# Patient Record
Sex: Male | Born: 1960
Health system: Southern US, Community
[De-identification: ages and names within clinical notes are randomized; demographics above are authoritative.]

## PROBLEM LIST (undated history)

## (undated) ENCOUNTER — Emergency Department (HOSPITAL_COMMUNITY): Admission: EM | Payer: Self-pay | Source: Home / Self Care

## (undated) DIAGNOSIS — I10 Essential (primary) hypertension: Secondary | ICD-10-CM

## (undated) DIAGNOSIS — M1990 Unspecified osteoarthritis, unspecified site: Secondary | ICD-10-CM

## (undated) DIAGNOSIS — E7800 Pure hypercholesterolemia, unspecified: Secondary | ICD-10-CM

## (undated) DIAGNOSIS — C61 Malignant neoplasm of prostate: Secondary | ICD-10-CM

## (undated) DIAGNOSIS — M109 Gout, unspecified: Secondary | ICD-10-CM

## (undated) HISTORY — PX: LUMBAR DISC SURGERY: SHX700

## (undated) HISTORY — PX: KNEE ARTHROSCOPY: SHX127

## (undated) HISTORY — DX: Unspecified osteoarthritis, unspecified site: M19.90

---

## 1997-06-02 ENCOUNTER — Emergency Department (HOSPITAL_COMMUNITY): Admission: EM | Admit: 1997-06-02 | Discharge: 1997-06-02 | Payer: Self-pay | Admitting: *Deleted

## 2001-04-26 ENCOUNTER — Emergency Department (HOSPITAL_COMMUNITY): Admission: EM | Admit: 2001-04-26 | Discharge: 2001-04-26 | Payer: Self-pay

## 2003-08-12 ENCOUNTER — Encounter: Admission: RE | Admit: 2003-08-12 | Discharge: 2003-08-12 | Payer: Self-pay | Admitting: Family Medicine

## 2004-07-20 ENCOUNTER — Emergency Department (HOSPITAL_COMMUNITY): Admission: EM | Admit: 2004-07-20 | Discharge: 2004-07-20 | Payer: Self-pay | Admitting: Emergency Medicine

## 2006-10-11 ENCOUNTER — Ambulatory Visit (HOSPITAL_COMMUNITY): Admission: RE | Admit: 2006-10-11 | Discharge: 2006-10-11 | Payer: Self-pay | Admitting: Neurosurgery

## 2008-07-28 ENCOUNTER — Emergency Department (HOSPITAL_COMMUNITY): Admission: EM | Admit: 2008-07-28 | Discharge: 2008-07-28 | Payer: Self-pay | Admitting: Emergency Medicine

## 2008-07-28 ENCOUNTER — Ambulatory Visit: Payer: Self-pay | Admitting: Surgery

## 2008-07-28 ENCOUNTER — Encounter (INDEPENDENT_AMBULATORY_CARE_PROVIDER_SITE_OTHER): Payer: Self-pay | Admitting: Emergency Medicine

## 2010-04-30 LAB — DIFFERENTIAL
Basophils Absolute: 0.1 10*3/uL (ref 0.0–0.1)
Basophils Relative: 1 % (ref 0–1)
Eosinophils Absolute: 0.2 10*3/uL (ref 0.0–0.7)
Eosinophils Relative: 2 % (ref 0–5)
Lymphocytes Relative: 40 % (ref 12–46)
Lymphs Abs: 3.3 10*3/uL (ref 0.7–4.0)
Monocytes Absolute: 0.6 10*3/uL (ref 0.1–1.0)
Monocytes Relative: 8 % (ref 3–12)
Neutro Abs: 4.2 10*3/uL (ref 1.7–7.7)
Neutrophils Relative %: 50 % (ref 43–77)

## 2010-04-30 LAB — BASIC METABOLIC PANEL
BUN: 12 mg/dL (ref 6–23)
CO2: 27 mEq/L (ref 19–32)
Calcium: 9.5 mg/dL (ref 8.4–10.5)
Chloride: 107 mEq/L (ref 96–112)
Creatinine, Ser: 1.08 mg/dL (ref 0.4–1.5)
GFR calc Af Amer: >60 mL/min (ref >60)
GFR calc non Af Amer: >60 mL/min (ref >60)
Glucose, Bld: 107 mg/dL — ABNORMAL HIGH (ref 70–99)
Potassium: 3.9 mEq/L (ref 3.5–5.1)
Sodium: 141 mEq/L (ref 135–145)

## 2010-04-30 LAB — BODY FLUID CULTURE: Culture: NO GROWTH

## 2010-04-30 LAB — CBC
HCT: 44.6 % (ref 39.0–52.0)
Hemoglobin: 15.4 g/dL (ref 13.0–17.0)
MCHC: 34.5 g/dL (ref 30.0–36.0)
MCV: 94.3 fL (ref 78.0–100.0)
Platelets: 210 10*3/uL (ref 150–400)
RBC: 4.73 MIL/uL (ref 4.22–5.81)
RDW: 13.9 % (ref 11.5–15.5)
WBC: 8.4 10*3/uL (ref 4.0–10.5)

## 2010-04-30 LAB — SYNOVIAL CELL COUNT + DIFF, W/ CRYSTALS
Eosinophils-Synovial: 0 % (ref 0–1)
Lymphocytes-Synovial Fld: 1 % (ref 0–20)
Neutrophil, Synovial: 78 % — ABNORMAL HIGH (ref 0–25)
Other Cells-SYN: 0

## 2010-06-06 NOTE — Op Note (Signed)
NAMEMACAI, SISNEROS               ACCOUNT NO.:  [id number]   MEDICAL RECORD NO.:  [id number]          PATIENT TYPE:  OIB   LOCATION:  3172                         FACILITY:  [facility]   PHYSICIAN:  Reinaldo Meeker, M.D. DATE OF BIRTH:  1960/10/13   DATE OF PROCEDURE:  10/11/2006  DATE OF DISCHARGE:                               OPERATIVE REPORT   PREOPERATIVE DIAGNOSIS:  Herniated disk L4-5 central.   POSTOPERATIVE DIAGNOSIS:  Herniated disk L4-5 central.   PROCEDURE:  1. Left L4-5 interlaminar laminotomy with excision of herniated disk      with operating microscope.  2. Microsection L4-5 disk and L5 nerve root.   SURGEON:  Reinaldo Meeker, M.D.   ASSISTANT:  Donalee Citrin, M.D.   PROCEDURE IN DETAIL:  After being placed in the prone position, the  patient's back was prepped and draped in the usual sterile fashion.  Localizing x-rays were taken prior to incision to identify the  appropriate level.  Midline incision was made above the spinous process  of L4 and L5.  Using Bovie cutting current the incision was carried down  to the spinous processes.  Subperiosteal dissection was then carried out  on the left-sided spinous processes and lamina.  Self-retraining  retractor was placed for exposure.  X-rays showed approach at the  appropriate level.  Using a high-speed drill the inferior 1/2 of the L4  lamina, and the medial third of the facet joint, and the superior third  of the L5 lamina were removed.  Residual bone and ligamentum flavum  removed in piecemeal fashion.   The microscope was draped brought out into the field and used for the  remainder of the case.  Using microsection technique the lateral aspect  of the thecal sac and the L5 nerve root were identified.  Further  coagulation was carried down to the floor of the canal to identify the  L4-5 disk which was found be diffusely herniated.  After coagulating the  annulus, the annulus was incised with a 15-blade.  Using  pituitary  rongeurs and curettes, this clean-out was carried out with particular  attention to get up behind the L4 body to clean out the disk material  from that region.  At this time, inspection was carried out in all  directions for any evidence of residual compression, and none could be  identified.  Large amounts of irrigation were carried out.  Any bleeding  was controlled by electrocoagulation and Gelfoam.  The wound was then  closed in multiple layers of Vicryl, in the muscle, fascia, subcutaneous  and subcuticular tissues, and staples were placed on the skin.  A  sterile dressing was then applied; and the patient was extubated, and  taken to the recovery room in stable condition.           ______________________________  Reinaldo Meeker, M.D.     ROK/MEDQ  D:  10/11/2006  T:  10/11/2006  Job:  [id number]

## 2010-06-17 ENCOUNTER — Inpatient Hospital Stay (INDEPENDENT_AMBULATORY_CARE_PROVIDER_SITE_OTHER)
Admission: RE | Admit: 2010-06-17 | Discharge: 2010-06-17 | Disposition: A | Discharge status: Home / Self Care | Payer: Self-pay | Source: Ambulatory Visit | Attending: Family Medicine | Admitting: Family Medicine

## 2010-06-17 DIAGNOSIS — K649 Unspecified hemorrhoids: Secondary | ICD-10-CM

## 2010-11-03 LAB — BASIC METABOLIC PANEL
CO2: 26
Calcium: 9.9
Creatinine, Ser: 1.15
Glucose, Bld: 111 — ABNORMAL HIGH

## 2010-11-03 LAB — CBC
Hemoglobin: 15.3
MCHC: 34.1
RDW: 14

## 2012-07-30 ENCOUNTER — Emergency Department (HOSPITAL_COMMUNITY)
Admission: EM | Admit: 2012-07-30 | Discharge: 2012-07-30 | Disposition: A | Discharge status: Home / Self Care | Payer: Self-pay | Attending: Emergency Medicine | Admitting: Emergency Medicine

## 2012-07-30 ENCOUNTER — Encounter (HOSPITAL_COMMUNITY): Payer: Self-pay

## 2012-07-30 DIAGNOSIS — I10 Essential (primary) hypertension: Secondary | ICD-10-CM | POA: Insufficient documentation

## 2012-07-30 DIAGNOSIS — R52 Pain, unspecified: Secondary | ICD-10-CM | POA: Insufficient documentation

## 2012-07-30 DIAGNOSIS — M549 Dorsalgia, unspecified: Secondary | ICD-10-CM

## 2012-07-30 DIAGNOSIS — Z8739 Personal history of other diseases of the musculoskeletal system and connective tissue: Secondary | ICD-10-CM | POA: Insufficient documentation

## 2012-07-30 DIAGNOSIS — F17200 Nicotine dependence, unspecified, uncomplicated: Secondary | ICD-10-CM | POA: Insufficient documentation

## 2012-07-30 DIAGNOSIS — M545 Low back pain, unspecified: Secondary | ICD-10-CM | POA: Insufficient documentation

## 2012-07-30 DIAGNOSIS — G8929 Other chronic pain: Secondary | ICD-10-CM | POA: Insufficient documentation

## 2012-07-30 DIAGNOSIS — Z862 Personal history of diseases of the blood and blood-forming organs and certain disorders involving the immune mechanism: Secondary | ICD-10-CM | POA: Insufficient documentation

## 2012-07-30 DIAGNOSIS — Z9889 Other specified postprocedural states: Secondary | ICD-10-CM | POA: Insufficient documentation

## 2012-07-30 DIAGNOSIS — Z8639 Personal history of other endocrine, nutritional and metabolic disease: Secondary | ICD-10-CM | POA: Insufficient documentation

## 2012-07-30 HISTORY — DX: Essential (primary) hypertension: I10

## 2012-07-30 HISTORY — DX: Pure hypercholesterolemia, unspecified: E78.00

## 2012-07-30 MED ORDER — TRAMADOL HCL 50 MG PO TABS: 50.0000 mg | ORAL_TABLET | Freq: Four times a day (QID) | ORAL | Status: DC | PRN

## 2012-07-30 MED ORDER — OXYCODONE-ACETAMINOPHEN 5-325 MG PO TABS: 1.0000 | ORAL_TABLET | ORAL | Status: DC | PRN

## 2012-07-30 NOTE — ED Notes (Signed)
Pt c/o back pain. Had surgery per Dr. Gerlene Fee in 2008. Has had chronic pain since then. Was with a pain clinic but "won his settlement" and no longer has insurance. Went back to work 2 days ago and started having low back pain. Denies numbness to leg/foot.

## 2012-07-30 NOTE — ED Notes (Addendum)
Pt c/o chronic lower back pain since 2008, pt had surgery for a ruptured disc in 2008 returned to work on July 07, 2012. Pt presents today with increase pain to lower back

## 2012-07-30 NOTE — ED Provider Notes (Signed)
History  This chart was scribed for non-physician practitioner working with Joya Gaskins, MD by Greggory Stallion, ED scribe. This patient was seen in room TR08C/TR08C and the patient's care was started at 6:12 PM.  CSN: [id number] Arrival date & time 07/30/12  1647   Chief Complaint  Patient presents with  . Back Pain   The history is provided by the patient. No language interpreter was used.    HPI Comments: Wayne [name] is a 52 y.o. male with h/o chronic back pain who presents to the Emergency Department complaining of sudden onset, constant, sharp, lower back pain.  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 Pain worse with heavy lifting, pushing, and pulling motions.  Pt states he has taken Aleve for the pain with no relief.  No numbness or paresthesias of LE.  No loss of bowel or bladder function.  No urinary sx.  Past Medical History  Diagnosis Date  . Hypertension   . High cholesterol    Past Surgical History  Procedure Laterality Date  . Lumbar disc surgery    . Knee arthroscopy     History reviewed. No pertinent family history. History  Substance Use Topics  . Smoking status: Current Every Day Smoker  . Smokeless tobacco: Not on file  . Alcohol Use: Yes    Review of Systems  Musculoskeletal: Positive for back pain.  All other systems reviewed and are negative.    Allergies  Review of patient's allergies indicates no known allergies.  Home Medications  No current outpatient prescriptions on file.  BP 187/105  Pulse 62  Temp(Src) 98 F (36.7 C) (Oral)  Resp 18  SpO2 99%  Physical Exam  Nursing note and vitals reviewed. Constitutional: He is oriented to person, place, and time. He appears well-developed and well-nourished.  HENT:  Head: Normocephalic and atraumatic.  Mouth/Throat: Oropharynx is  clear and moist.  Eyes: Conjunctivae and EOM are normal.  Neck: Normal range of motion. Neck supple.  Cardiovascular: Normal rate, regular rhythm and normal heart sounds.   Pulmonary/Chest: Effort normal and breath sounds normal.  Musculoskeletal: Normal range of motion.       Lumbar back: He exhibits tenderness and pain. He exhibits normal range of motion, no bony tenderness, no swelling, no edema, no deformity, no laceration, no spasm and normal pulse.       Back:  LS- midline incision from prior surgery, TTP of paraspinal muscles bilaterally, negative straight leg raise, distal sensation intact, normal gait  Neurological: He is alert and oriented to person, place, and time.  Skin: Skin is warm and dry.  Psychiatric: He has a normal mood and affect.    ED Course  Procedures (including critical care time)  DIAGNOSTIC STUDIES: Oxygen Saturation is 99% on RA, normal by my interpretation.    COORDINATION OF CARE: 7:48 PM-Discussed treatment plan which includes pain medications with pt at bedside and pt agreed to plan.   Labs Reviewed - No data to display No results found. 1. Back pain     MDM   Back pain acute on chronic, likely from pts recent return to work after several years off. No concern for cauda equina. Rx tramadol and percocet.  Discussed plan with pt, he agreed.  Return precautions advised.  I personally performed the services described in this documentation, which  was scribed in my presence. The recorded information has been reviewed and is accurate.   Garlon Hatchet, PA-C 07/31/12 [phone]

## 2012-08-02 NOTE — ED Provider Notes (Signed)
Medical screening examination/treatment/procedure(s) were performed by non-physician practitioner and as supervising physician I was immediately available for consultation/collaboration.   Joya Gaskins, MD 08/02/12 [phone]

## 2013-05-29 ENCOUNTER — Telehealth (HOSPITAL_BASED_OUTPATIENT_CLINIC_OR_DEPARTMENT_OTHER): Payer: Self-pay | Admitting: *Deleted

## 2013-05-29 ENCOUNTER — Emergency Department (HOSPITAL_COMMUNITY)
Admission: EM | Admit: 2013-05-29 | Discharge: 2013-05-29 | Disposition: A | Discharge status: Home / Self Care | Payer: No Typology Code available for payment source | Attending: Emergency Medicine | Admitting: Emergency Medicine

## 2013-05-29 ENCOUNTER — Encounter (HOSPITAL_COMMUNITY): Payer: Self-pay | Admitting: Emergency Medicine

## 2013-05-29 DIAGNOSIS — S43499A Other sprain of unspecified shoulder joint, initial encounter: Secondary | ICD-10-CM | POA: Insufficient documentation

## 2013-05-29 DIAGNOSIS — S46812A Strain of other muscles, fascia and tendons at shoulder and upper arm level, left arm, initial encounter: Secondary | ICD-10-CM

## 2013-05-29 DIAGNOSIS — Z791 Long term (current) use of non-steroidal anti-inflammatories (NSAID): Secondary | ICD-10-CM | POA: Insufficient documentation

## 2013-05-29 DIAGNOSIS — I10 Essential (primary) hypertension: Secondary | ICD-10-CM | POA: Insufficient documentation

## 2013-05-29 DIAGNOSIS — S46819A Strain of other muscles, fascia and tendons at shoulder and upper arm level, unspecified arm, initial encounter: Principal | ICD-10-CM

## 2013-05-29 DIAGNOSIS — Z8639 Personal history of other endocrine, nutritional and metabolic disease: Secondary | ICD-10-CM | POA: Insufficient documentation

## 2013-05-29 DIAGNOSIS — X500XXA Overexertion from strenuous movement or load, initial encounter: Secondary | ICD-10-CM | POA: Insufficient documentation

## 2013-05-29 DIAGNOSIS — Z862 Personal history of diseases of the blood and blood-forming organs and certain disorders involving the immune mechanism: Secondary | ICD-10-CM | POA: Insufficient documentation

## 2013-05-29 DIAGNOSIS — Y9389 Activity, other specified: Secondary | ICD-10-CM | POA: Insufficient documentation

## 2013-05-29 DIAGNOSIS — Y929 Unspecified place or not applicable: Secondary | ICD-10-CM | POA: Insufficient documentation

## 2013-05-29 DIAGNOSIS — F17200 Nicotine dependence, unspecified, uncomplicated: Secondary | ICD-10-CM | POA: Insufficient documentation

## 2013-05-29 MED ORDER — HYDROCODONE-ACETAMINOPHEN 5-325 MG PO TABS: 1.0000 | ORAL_TABLET | ORAL | Status: DC | PRN

## 2013-05-29 MED ORDER — METHOCARBAMOL 500 MG PO TABS: 500.0000 mg | ORAL_TABLET | Freq: Two times a day (BID) | ORAL | Status: DC

## 2013-05-29 NOTE — ED Provider Notes (Signed)
CSN: [id number]     Arrival date & time 05/29/13  2118 History  This chart was scribed for non-physician practitioner, Abigail Butts, PA-C, working with Orlie Dakin, MD by Ladene Artist, ED Scribe. This patient was seen in room TR06C/TR06C and the patient's care was started at 10:37 PM.    Chief Complaint  Patient presents with  . Shoulder Pain   The history is provided by the patient and medical records. No language interpreter was used.   HPI Comments: Wayne [name] is a 53 y.o. male who presents to the Emergency Department complaining of sharp L shoulder pain onset 3 days ago. He reports the pain is located in the anterior and posterior shoulder. Pt reports a fall this winter after slipping on ice in the driveway and landing on his left shoulder. He reports he had pain for 3 days which resolved without lingering symptoms.  Pt states that the power steering then went out in his truck last week and has noted gradually worsening pain to the left shoulder since that time.  Pain is worse with movement. He denies neck pain, SOB, nausea, sweats, pain or shortness of breath with walking. Pt has tried Tramadol, Meloxicam, extra strength tylenol and applied Bengay to the area with mild relief. Patient denies personal or family cardiac history.  Past Medical History  Diagnosis Date  . Hypertension   . High cholesterol    Past Surgical History  Procedure Laterality Date  . Lumbar disc surgery    . Knee arthroscopy     History reviewed. No pertinent family history. History  Substance Use Topics  . Smoking status: Current Every Day Smoker -- 1.00 packs/day    Types: Cigarettes  . Smokeless tobacco: Not on file  . Alcohol Use: 8.4 oz/week    14 Cans of beer per week    Review of Systems  Constitutional: Negative for fever, chills and diaphoresis.  Respiratory: Negative for shortness of breath.   Cardiovascular: Negative for chest pain.  Gastrointestinal: Negative for nausea and  vomiting.  Musculoskeletal: Positive for arthralgias ( l shoudler). Negative for back pain, joint swelling, neck pain and neck stiffness.  Skin: Negative for wound.  Neurological: Negative for numbness.  Hematological: Does not bruise/bleed easily.  Psychiatric/Behavioral: The patient is not nervous/anxious.   All other systems reviewed and are negative.   Allergies  Review of patient's allergies indicates no known allergies.  Home Medications   Prior to Admission medications   Medication Sig Start Date End Date Taking? Authorizing Provider  meloxicam (MOBIC) 15 MG tablet Take 15 mg by mouth daily.   Yes Historical Provider, MD  traMADol (ULTRAM) 50 MG tablet Take 1 tablet (50 mg total) by mouth every 6 (six) hours as needed for pain. 07/30/12  Yes Larene Pickett, PA-C   BP 185/91  Pulse 84  Temp(Src) 98.3 F (36.8 C) (Oral)  Resp 18  SpO2 97% Physical Exam  Nursing note and vitals reviewed. Constitutional: He is oriented to person, place, and time. He appears well-developed and well-nourished. No distress.  HENT:  Head: Normocephalic and atraumatic.  Eyes: Conjunctivae are normal.  Neck: Normal range of motion.  Cardiovascular: Normal rate, regular rhythm, normal heart sounds and intact distal pulses.   No murmur heard. Capillary refill less than 3 seconds in the bilateral upper extremities Intact distal pulses Regular rate and rhythm  Pulmonary/Chest: Effort normal and breath sounds normal. No respiratory distress.  Clear and equal breath sounds  Musculoskeletal: Normal range of  motion. He exhibits tenderness. He exhibits no edema.       Left shoulder: He exhibits pain and spasm (left trapezius). He exhibits normal range of motion, no tenderness, no bony tenderness, no swelling, no effusion, no crepitus, no deformity, no laceration, normal pulse and normal strength.  Full ROM of L shoulder with pain; no joint line tenderness Pain to palp of the L trapezia  No pain to  palpation of L chest wall Negative empty can test  Lymphadenopathy:    He has no axillary adenopathy.  Neurological: He is alert and oriented to person, place, and time. Coordination normal.  Sensation intact to dull and sharp in the bilateral upper extremities Strength 5 out of 5 in the bilateral upper extremities to resisted flexion, extension, abduction and adduction including strong and equal grip strength  Skin: Skin is warm and dry. He is not diaphoretic. No erythema.  No tenting of the skin  Psychiatric: He has a normal mood and affect.    ED Course  Procedures (including critical care time) DIAGNOSTIC STUDIES: Oxygen Saturation is 97% on RA, normal by my interpretation.    COORDINATION OF CARE: 10:44 PM Discussed treatment plan with pt at bedside and pt agreed to plan.  Labs Review Labs Reviewed - No data to display  Imaging Review No results found.   EKG Interpretation None      MDM   Final diagnoses:  Strain of left trapezius muscle   [name] presents with nontraumatic left shoulder pain.  Pt without chest pain or history of physical consistent with cardiac etiology.  Patient with palpable spasm of the left trapezius.    Patient without trauma, no x-ray indicated at this time. Pt advised to follow up with primary care if symptoms persist for further evaluation and treatment.Conservative therapy recommended and discussed. No swelling given as I would like patient to continue range of motion of the shoulder to prevent frozen shoulder. Patient is return to emergency department for fevers, loss of movement in the shoulder or weakness in the arms. Patient will be dc home & is agreeable with above plan.  It has been determined that no acute conditions requiring further emergency intervention are present at this time. The patient/guardian have been advised of the diagnosis and plan. We have discussed signs and symptoms that warrant return to the ED, such as changes  or worsening in symptoms.   Vital signs are stable at discharge.   BP 185/91  Pulse 84  Temp(Src) 98.3 F (36.8 C) (Oral)  Resp 18  SpO2 97%  Patient/guardian has voiced understanding and agreed to follow-up with the PCP or specialist.  I personally performed the services described in this documentation, which was scribed in my presence. The recorded information has been reviewed and is accurate.    Jarrett Soho Rodgers Likes, PA-C 05/29/13 [id number]

## 2013-05-29 NOTE — Telephone Encounter (Signed)
Pt called requesting a work note.

## 2013-05-29 NOTE — Discharge Instructions (Signed)
1. Medications: Vicodin, Robaxin, usual home medications including your meloxicam 2. Treatment: rest, drink plenty of fluids, and gentle stretching 3. Follow Up: Please followup with your primary doctor for discussion of your diagnoses and further evaluation after today's visit; if you do not have a primary care doctor use the resource guide provided to find one;   Shoulder Pain The shoulder is the joint that connects your arms to your body. The bones that form the shoulder joint include the upper arm bone (humerus), the shoulder blade (scapula), and the collarbone (clavicle). The top of the humerus is shaped like a ball and fits into a rather flat socket on the scapula (glenoid cavity). A combination of muscles and strong, fibrous tissues that connect muscles to bones (tendons) support your shoulder joint and hold the ball in the socket. Small, fluid-filled sacs (bursae) are located in different areas of the joint. They act as cushions between the bones and the overlying soft tissues and help reduce friction between the gliding tendons and the bone as you move your arm. Your shoulder joint allows a wide range of motion in your arm. This range of motion allows you to do things like scratch your back or throw a ball. However, this range of motion also makes your shoulder more prone to pain from overuse and injury. Causes of shoulder pain can originate from both injury and overuse and usually can be grouped in the following four categories:  Redness, swelling, and pain (inflammation) of the tendon (tendinitis) or the bursae (bursitis).  Instability, such as a dislocation of the joint.  Inflammation of the joint (arthritis).  Broken bone (fracture). HOME CARE INSTRUCTIONS   Apply ice to the sore area.  Put ice in a plastic bag.  Place a towel between your skin and the bag.  Leave the ice on for 15-20 minutes, 03-04 times per day for the first 2 days.  Stop using cold packs if they do not help  with the pain.  If you have a shoulder sling or immobilizer, wear it as long as your caregiver instructs. Only remove it to shower or bathe. Move your arm as little as possible, but keep your hand moving to prevent swelling.  Squeeze a soft ball or foam pad as much as possible to help prevent swelling.  Only take over-the-counter or prescription medicines for pain, discomfort, or fever as directed by your caregiver. SEEK MEDICAL CARE IF:   Your shoulder pain increases, or new pain develops in your arm, hand, or fingers.  Your hand or fingers become cold and numb.  Your pain is not relieved with medicines. SEEK IMMEDIATE MEDICAL CARE IF:   Your arm, hand, or fingers are numb or tingling.  Your arm, hand, or fingers are significantly swollen or turn white or blue. MAKE SURE YOU:   Understand these instructions.  Will watch your condition.  Will get help right away if you are not doing well or get worse. Document Released: 10/18/2004 Document Revised: 10/03/2011 Document Reviewed: 12/23/2010 [facility] Patient Information 2014 [place], [place].   Emergency Department Resource Guide 1) Find a Doctor and Pay Out of Pocket Although you won't have to find out who is covered by your insurance plan, it is a good idea to ask around and get recommendations. You will then need to call the office and see if the doctor you have chosen will accept you as a new patient and what types of options they offer for patients who are self-pay. Some doctors offer  discounts or will set up payment plans for their patients who do not have insurance, but you will need to ask so you aren't surprised when you get to your appointment.  2) Contact Your Local Health Department Not all health departments have doctors that can see patients for sick visits, but many do, so it is worth a call to see if yours does. If you don't know where your local health department is, you can check in your phone book. The CDC also has a  tool to help you locate your state's health department, and many state websites also have listings of all of their local health departments.  3) Find a [facility] If your illness is not likely to be very severe or complicated, you may want to try a walk in clinic. These are popping up all over the country in pharmacies, drugstores, and shopping centers. They're usually staffed by nurse practitioners or physician assistants that have been trained to treat common illnesses and complaints. They're usually fairly quick and inexpensive. However, if you have serious medical issues or chronic medical problems, these are probably not your best option.  No Primary Care Doctor: - Call Health Connect at  [phone] - they can help you locate a primary care doctor that  accepts your insurance, provides certain services, etc. - Physician Referral Service- [phone]  Chronic Pain Problems: Organization         Address  Phone   Notes  [facility]  [phone] Patients need to be referred by their primary care doctor.   Medication Assistance: Organization         Address  Phone   Notes  [facility] Medication [facility] [address] [place], [place] [address] [phone] --Must be a resident of [facility] -- Must have NO insurance coverage whatsoever (no Medicaid/ Medicare, etc.) -- The pt. MUST have a primary care doctor that directs their care regularly and follows them in the community   [facility]  [phone]   [employer]  [phone]    Agencies that provide inexpensive medical care: Organization         Address  Phone   Notes  [place]  [phone]   [facility]    [phone]   [facility] [place], [place] [address] [phone]   [place] [address] [place] [phone]   Planned Parenthood    [phone]    [facility]    [phone]   [place] and [address], [place] Phone:  [phone], Fax:  [phone] Hours of Operation:  9 am - 6 pm, M-F.  Also accepts Medicaid/Medicare and self-pay.  [facility] for [place], [address], [place] Phone: [phone], Fax: [phone]. Hours of Operation:  8:30 am - 5:30 pm, M-F.  Also accepts Medicaid and self-pay.  [facility] [place] [address] [place] Phone: [phone]   [place], [place], [place] [phone], Ext. [phone] Mondays & Thursdays: 7-9 AM.  First 15 patients are seen on a first come, first serve basis.    [place] Providers:  Organization         Address  Phone   Notes  [facility] [address],  [place] [phone] Also accepts self-pay patients.  [facility] [address], [place]  [phone]   [place], [address], [place] [phone]   [facility] [address], [place] [phone]   Lucianne Lei [address], [place]   [phone] Only accepts [place] Access [place] patients after they have their name applied to their card.   Self-Pay (no insurance) in [facility]:  Organization         Address  Phone   Notes  Sickle Cell Patients, [facility] [place] [phone]   [facility] [phone]   [facility] [place]  [place], [place], [place] [phone]   [facility]/Dr. Osei-Bonsu  [address] [place] or [address], [place] [phone] Phone number for both [place] and [place] locations is the same.  [facility] and [facility] [address] [place] [phone]   [facility] [address], [place] or [address] [phone]   [facility] [address] [place] [phone], phone; [phone], fax Sees patients 1st and 3rd Saturday of every month.  Must not qualify for public or private insurance (i.e. Medicaid, Medicare, [place] Health Choice, Veterans' Benefits)  Household income should be no more than 200% of the poverty level The clinic cannot treat you if you are pregnant or think you are pregnant  Sexually transmitted diseases are not treated at the clinic.    Dental Care: Organization         Address  Phone  Notes  [facility] Department of [facility] [place] [phone] Accepts children up to age 22 who are enrolled in [place] or [place]; pregnant women with a Medicaid card; and children who have applied for Medicaid or [place] Health Choice, but were declined, whose parents can pay a reduced fee at time of service.  [facility] Department of [facility]  [address], [place] [phone] Accepts children up to age 76 who are enrolled in [place] or [place]; pregnant women with a Medicaid card; and children who have applied for Medicaid or [place] Health Choice, but were declined, whose parents can pay a reduced fee at time of service.  [place] Adult [facility] Access PROGRAM  [place] [phone] Patients are seen by appointment only. Walk-ins are not accepted. [place] will see patients 20 years of age and older. Monday - Tuesday (8am-5pm) Most Wednesdays (8:30-5pm) $30 per visit, cash only  [facility] Adult Dental Access PROGRAM  [address], [facility] [phone] Patients are seen by appointment only. Walk-ins are not accepted. [place] will see patients 76 years of age and older. One Wednesday  Evening (Monthly: Volunteer Based).  $30 per visit, cash only  [place]  [phone] for adults; Children under age 66, call Graduate Pediatric Dentistry at [phone]. Children aged 76-14, please call [phone] to request a pediatric application.  Dental services are provided in all areas of dental care including fillings, crowns and bridges, complete and partial dentures, implants, gum treatment, root canals, and extractions. Preventive care is  also provided. Treatment is provided to both adults and children. Patients are selected via a lottery and there is often a waiting list.   [facility] [address] [place]  [phone] [email or web address]   [facility] [address] [place], [place] [phone], Ext. [phone] Second and Fourth Thursday of each month, opens at 6:30 AM; Clinic ends at 9 AM.  Patients are seen on a first-come first-served basis, and a limited number are seen during each clinic.   [facility]  [address] Hillard Danker [place], [place] [phone]   Eligibility Requirements You must have lived in [place], [place], or [place] counties for at least the last three months.   You cannot be eligible for state or federal sponsored [employer], including [employer], [place], or [employer].   You generally cannot be eligible for healthcare insurance through your employer.    How to apply: Eligibility screenings are held every Tuesday and Wednesday afternoon from 1:00 pm until 4:00 pm. You do not need an appointment for the interview!  [facility] [address] [place], [place]   [place]  [place] Department  [place]  [phone]    Behavioral Health Resources in the Community: Intensive Outpatient Programs Organization         Address  Phone  Notes  [place]. [address], [place], [place] [phone]   [facility] Outpatient [address] [place], [place]   [facility]: [facility] [address] [place], [place]   [place] [address]  [place], [place] or [phone]   Substance Abuse Resources Organization         Address  Phone  Notes  [facility] Drug Services  [phone]   [place]  [phone]   [facility] [place]   Chinita Pester  [phone]   Residential & Outpatient Substance Abuse Program  [phone]   Psychological Services Organization         Address  Phone  Notes  [facility] [place]  [place]  [phone]   [place] [address], [place] or [phone]    Mobile Crisis Teams Organization         Address  Phone  Notes  [facility], Mobile Crisis Care Unit  [phone]   [facility]  [address] [place], [place]   [place] [address], [place] [phone]    Self-Help/Support Groups Organization         Address  Phone             Notes  [place]. of [place] - variety of support groups  [place] Call for more information  Narcotics Anonymous (NA), [facility] [address], [employer] [place]  2 meetings at this location   Special educational needs teacher         Address  Phone  Notes  [facility] [place],    [place]  [phone]   [facility]  [address] [place] [id number], [place], [place]   [place], [place] [phone] Admissions: 8am-3pm M-F  Incentives Substance [place] [address]  [place], [place]   [facility] [address] Jadene Pierini  [place], [place]   [place].,  [place], [place]   [facility] - Intensive Outpatient [address] Kristeen [address], [place], [place]   [facility] [address]  [place], [place] [phone] or [phone]   [facility] (RTS) [address], [place], [place] Accepts Medicaid  [facility] [place] [address]  [place] [phone] Substance Abuse/Addiction Treatment   [facility] Organization         Address  Phone  Notes  [facility]  [phone]   Domenic Schwab, PhD [address] Arlis Porta [place], [place]   [phone] or [phone]   [place], [place] [phone]   [facility] [address], [place], [place] [phone] Insurance/Medicaid/sponsorship through [facility] and Families [address], [address]                                    [place], [place] [phone] [place] [address] [place], [place] [phone]    Dr. Adele Schilder  [phone]   [facility] Dept. 1) [address], [place] 2) [place] 3)  [address], [place] [phone]  [phone]   [place] [phone] or [phone] (After Hours)

## 2013-05-29 NOTE — ED Notes (Signed)
Pt reports left shoulder pain x 3 days; states "the power steering went out on my truck and ever since my shoulder has been sore." States the pain is worse with movement, but denies injury. NAD, denies any chest pain, SOB.  Pt noted to be hypertensive in triage, hx of same, denies taking medication.

## 2013-05-30 NOTE — ED Provider Notes (Signed)
Medical screening examination/treatment/procedure(s) were performed by non-physician practitioner and as supervising physician I was immediately available for consultation/collaboration.   EKG Interpretation None       Orlie Dakin, MD 05/30/13 [phone]

## 2013-11-26 ENCOUNTER — Encounter (HOSPITAL_COMMUNITY): Payer: Self-pay | Admitting: Emergency Medicine

## 2013-11-26 ENCOUNTER — Emergency Department (HOSPITAL_COMMUNITY)
Admission: EM | Admit: 2013-11-26 | Discharge: 2013-11-26 | Disposition: A | Discharge status: Home / Self Care | Payer: Self-pay | Attending: Emergency Medicine | Admitting: Emergency Medicine

## 2013-11-26 DIAGNOSIS — M5442 Lumbago with sciatica, left side: Secondary | ICD-10-CM

## 2013-11-26 DIAGNOSIS — I10 Essential (primary) hypertension: Secondary | ICD-10-CM | POA: Insufficient documentation

## 2013-11-26 DIAGNOSIS — Z791 Long term (current) use of non-steroidal anti-inflammatories (NSAID): Secondary | ICD-10-CM | POA: Insufficient documentation

## 2013-11-26 DIAGNOSIS — Z9889 Other specified postprocedural states: Secondary | ICD-10-CM | POA: Insufficient documentation

## 2013-11-26 DIAGNOSIS — Z720 Tobacco use: Secondary | ICD-10-CM | POA: Insufficient documentation

## 2013-11-26 DIAGNOSIS — Z79899 Other long term (current) drug therapy: Secondary | ICD-10-CM | POA: Insufficient documentation

## 2013-11-26 DIAGNOSIS — Z8639 Personal history of other endocrine, nutritional and metabolic disease: Secondary | ICD-10-CM | POA: Insufficient documentation

## 2013-11-26 DIAGNOSIS — M25562 Pain in left knee: Secondary | ICD-10-CM | POA: Insufficient documentation

## 2013-11-26 DIAGNOSIS — R200 Anesthesia of skin: Secondary | ICD-10-CM | POA: Insufficient documentation

## 2013-11-26 MED ORDER — METHOCARBAMOL 500 MG PO TABS: 500.0000 mg | ORAL_TABLET | Freq: Two times a day (BID) | ORAL | Status: DC

## 2013-11-26 MED ORDER — HYDROCODONE-ACETAMINOPHEN 5-325 MG PO TABS: 1.0000 | ORAL_TABLET | Freq: Four times a day (QID) | ORAL | Status: DC | PRN

## 2013-11-26 MED ORDER — NAPROXEN 500 MG PO TABS: 500.0000 mg | ORAL_TABLET | Freq: Two times a day (BID) | ORAL | Status: DC

## 2013-11-26 NOTE — Discharge Instructions (Signed)
Followup with orthopedics if symptoms continue. Use conservative methods at home including heat therapy and cold therapy as we discussed. More information on cold therapy is listed below.  It is not recommended to use heat treatment directly after an acute injury. ° °SEEK IMMEDIATE MEDICAL ATTENTION IF: °New numbness, tingling, weakness, or problem with the use of your arms or legs.  °Severe back pain not relieved with medications.  °Change in bowel or bladder control.  °Increasing pain in any areas of the body (such as chest or abdominal pain).  °Shortness of breath, dizziness or fainting.  °Nausea (feeling sick to your stomach), vomiting, fever, or sweats. ° °COLD THERAPY DIRECTIONS:  °Ice or gel packs can be used to reduce both pain and swelling. Ice is the most helpful within the first 24 to 48 hours after an injury or flareup from overusing a muscle or joint.  Ice is effective, has very few side effects, and is safe for most people to use.  ° °If you expose your skin to cold temperatures for too long or without the proper protection, you can damage your skin or nerves. Watch for signs of skin damage due to cold.  ° °HOME CARE INSTRUCTIONS  °Follow these tips to use ice and cold packs safely.  °Place a dry or damp towel between the ice and skin. A damp towel will cool the skin more quickly, so you may need to shorten the time that the ice is used.  °For a more rapid response, add gentle compression to the ice.  °Ice for no more than 10 to 20 minutes at a time. The bonier the area you are icing, the less time it will take to get the benefits of ice.  °Check your skin after 5 minutes to make sure there are no signs of a poor response to cold or skin damage.  °Rest 20 minutes or more in between uses.  °Once your skin is numb, you can end your treatment. You can test numbness by very lightly touching your skin. The touch should be so light that you do not see the skin dimple from the pressure of your fingertip. When  using ice, most people will feel these normal sensations in this order: cold, burning, aching, and numbness.  °Do not use ice on someone who cannot communicate their responses to pain, such as small children or people with dementia.  ° °HOW TO MAKE AN ICE PACK  °To make an ice pack, do one of the following:  °Place crushed ice or a bag of frozen vegetables in a sealable plastic bag. Squeeze out the excess air. Place this bag inside another plastic bag. Slide the bag into a pillowcase or place a damp towel between your skin and the bag.  °Mix 3 parts water with 1 part rubbing alcohol. Freeze the mixture in a sealable plastic bag. When you remove the mixture from the freezer, it will be slushy. Squeeze out the excess air. Place this bag inside another plastic bag. Slide the bag into a pillowcase or place a damp towel between your skin and the bag.  ° °SEEK MEDICAL CARE IF:  °You develop white spots on your skin. This may give the skin a blotchy (mottled) appearance.  °Your skin turns blue or pale.  °Your skin becomes waxy or hard.  °Your swelling gets worse.  °MAKE SURE YOU:  °Understand these instructions.  °Will watch your condition.  °Will get help right away if you are not doing well or   get worse.  ° ° °Chronic Pain Discharge Instructions  °Emergency care providers appreciate that many patients coming to us are in severe pain and we wish to address their pain in the safest, most responsible manner.  It is important to recognize however, that the proper treatment of chronic pain differs from that of the pain of injuries and acute illnesses.  Our goal is to provide quality, safe, personalized care and we thank you for giving us the opportunity to serve you. °The use of narcotics and related agents for chronic pain syndromes may lead to additional physical and psychological problems.  Nearly as many people die from prescription narcotics each year as die from car crashes.  Additionally, this risk is increased if such  prescriptions are obtained from a variety of sources.  Therefore, only your primary care physician or a pain management specialist is able to safely treat such syndromes with narcotic medications long-term.   ° °Documentation revealing such prescriptions have been sought from multiple sources may prohibit us from providing a refill or different narcotic medication.  Your name may be checked first through the [place] Controlled Substances Reporting System.  This database is a record of controlled substance medication prescriptions that the patient has received.  This has been established by  in an effort to eliminate the dangerous, and often life threatening, practice of obtaining multiple prescriptions from different medical providers.  ° °If you have a chronic pain syndrome (i.e. chronic headaches, recurrent back or neck pain, dental pain, abdominal or pelvis pain without a specific diagnosis, or neuropathic pain such as fibromyalgia) or recurrent visits for the same condition without an acute diagnosis, you may be treated with non-narcotics and other non-addictive medicines.  Allergic reactions or negative side effects that may be reported by a patient to such medications will not typically lead to the use of a narcotic analgesic or other controlled substance as an alternative. °  °Patients managing chronic pain with a personal physician should have provisions in place for breakthrough pain.  If you are in crisis, you should call your physician.  If your physician directs you to the emergency department, please have the doctor call and speak to our attending physician concerning your care. °  °When patients come to the Emergency Department (ED) with acute medical conditions in which the Emergency Department physician feels appropriate to prescribe narcotic or sedating pain medication, the physician will prescribe these in very limited quantities.  The amount of these medications will last only  until you can see your primary care physician in his/her office.  Any patient who returns to the ED seeking refills should expect only non-narcotic pain medications.  ° °In the event of an acute medical condition exists and the emergency physician feels it is necessary that the patient be given a narcotic or sedating medication -  a responsible adult driver should be present in the room prior to the medication being given by the nurse. °  °Prescriptions for narcotic or sedating medications that have been lost, stolen or expired will not be refilled in the Emergency Department.   ° °Patients who have chronic pain may receive non-narcotic prescriptions until seen by their primary care physician.  It is every patient’s personal responsibility to maintain active prescriptions with his or her primary care physician or specialist. ° ° ° °

## 2013-11-26 NOTE — ED Provider Notes (Signed)
CSN: [id number]     Arrival date & time 11/26/13  1442 History  This chart was scribed for non-physician practitioner, Hyman Bible, PA-C, working with Ernestina Patches, MD, by Delphia Grates, ED Scribe. This patient was seen in room TR05C/TR05C and the patient's care was started at 3:31 PM.    Chief Complaint  Patient presents with  . Back Pain  . Knee Pain    The history is provided by the patient. No language interpreter was used.     HPI Comments: Wayne [name] is a 53 y.o. male who presents to the Emergency Department complaining of gradually worsening lower back pain that radiates to the left side and down his left leg that started 6 days ago .Patient has history of lumbar disc surgery performed in 2008, and reports flare-ups of back pain since. Patient denies recent injury, trauma, or heavy lifting. There is associated intermittent numbness/tingling in the left leg and left knee pain which he reports has been present in the past. He also reports history of knee surgery (15 years ago). Patient has been using Voltaren cream and Tylenol 3 without significant improvement. He denies any stiffness, fever, chills, bowel/bladder incontinence, history of cancer, DM, HIV, or IV drug use.   Past Medical History  Diagnosis Date  . Hypertension   . High cholesterol    Past Surgical History  Procedure Laterality Date  . Lumbar disc surgery    . Knee arthroscopy     No family history on file. History  Substance Use Topics  . Smoking status: Current Every Day Smoker -- 1.00 packs/day    Types: Cigarettes  . Smokeless tobacco: Not on file  . Alcohol Use: 8.4 oz/week    14 Cans of beer per week    Review of Systems  Constitutional: Negative for fever and chills.  Musculoskeletal: Positive for back pain and arthralgias (left knee).  Neurological: Positive for numbness.      Allergies  Review of patient's allergies indicates no known allergies.  Home Medications   Prior to  Admission medications   Medication Sig Start Date End Date Taking? Authorizing Provider  HYDROcodone-acetaminophen (NORCO/VICODIN) 5-325 MG per tablet Take 1-2 tablets by mouth every 4 (four) hours as needed. 05/29/13   Hannah Muthersbaugh, PA-C  meloxicam (MOBIC) 15 MG tablet Take 15 mg by mouth daily.    Historical Provider, MD  methocarbamol (ROBAXIN) 500 MG tablet Take 1 tablet (500 mg total) by mouth 2 (two) times daily. 05/29/13   Hannah Muthersbaugh, PA-C  traMADol (ULTRAM) 50 MG tablet Take 1 tablet (50 mg total) by mouth every 6 (six) hours as needed for pain. 07/30/12   Larene Pickett, PA-C   Triage Vitals: BP 144/96 mmHg  Pulse 97  Temp(Src) 97.9 F (36.6 C) (Oral)  Resp 18  SpO2 97%  Physical Exam  Constitutional: He is oriented to person, place, and time. He appears well-developed and well-nourished. No distress.  HENT:  Head: Normocephalic and atraumatic.  Eyes: Conjunctivae and EOM are normal.  Neck: Normal range of motion. Neck supple. No tracheal deviation present.  Cardiovascular: Normal rate, regular rhythm and normal heart sounds.   Pulses:      Dorsalis pedis pulses are 2+ on the right side, and 2+ on the left side.  Pulmonary/Chest: Effort normal and breath sounds normal. No respiratory distress.  Musculoskeletal: Normal range of motion. He exhibits tenderness. He exhibits no edema.  TTP of the lumbar spine, old healed surgical scar. No overlying erythema or  edema. No step-offs or deformities. Left paraspinal TTP. No erythema, edema, or warmth of the left knee.   Neurological: He is alert and oriented to person, place, and time. Gait normal.  Reflex Scores:      Patellar reflexes are 2+ on the right side and 2+ on the left side. Muscle strength of lower extermities is normal bilaterally. Distal sensation of both knees is intact.  Skin: Skin is warm and dry.  Psychiatric: He has a normal mood and affect. His behavior is normal.  Nursing note and vitals  reviewed.   ED Course  Procedures (including critical care time)  DIAGNOSTIC STUDIES: Oxygen Saturation is 97% on room air, adequate by my interpretation.    COORDINATION OF CARE: At 9767 Discussed treatment plan with patient which includes muscle relaxer. Patient agrees.   Labs Review Labs Reviewed - No data to display  Imaging Review No results found.   EKG Interpretation None      MDM   Final diagnoses:  None   Patient with back pain.  No neurological deficits and normal neuro exam.  Patient can walk but states is painful.  No loss of bowel or bladder control.  No concern for cauda equina.  No fever, night sweats, weight loss, h/o cancer, IVDU.  RICE protocol and pain medicine indicated and discussed with patient.  Patient stable for discharge.  Return precautions given.      Hyman Bible, PA-C 11/26/13 [place], MD 11/26/13 2228

## 2013-11-26 NOTE — ED Notes (Signed)
Back pain and left knee pain since Friday no new injuries he states , feels like a knot is there

## 2013-11-26 NOTE — ED Notes (Signed)
Declined W/C at D/C and was escorted to lobby by RN. 

## 2016-04-03 ENCOUNTER — Ambulatory Visit (INDEPENDENT_AMBULATORY_CARE_PROVIDER_SITE_OTHER): Payer: Self-pay | Admitting: Physician Assistant

## 2016-04-05 ENCOUNTER — Ambulatory Visit (INDEPENDENT_AMBULATORY_CARE_PROVIDER_SITE_OTHER): Payer: Self-pay | Admitting: Physician Assistant

## 2016-04-05 ENCOUNTER — Encounter (INDEPENDENT_AMBULATORY_CARE_PROVIDER_SITE_OTHER): Payer: Self-pay | Admitting: Physician Assistant

## 2016-04-05 VITALS — BP 136/91 | HR 83 | Temp 98.1°F | Ht 74.0 in | Wt 258.2 lb

## 2016-04-05 DIAGNOSIS — I10 Essential (primary) hypertension: Secondary | ICD-10-CM

## 2016-04-05 DIAGNOSIS — M109 Gout, unspecified: Secondary | ICD-10-CM

## 2016-04-05 MED ORDER — CLONIDINE HCL 0.2 MG PO TABS
0.2000 mg | ORAL_TABLET | Freq: Once | ORAL | Status: AC
  Administered 2016-04-05: 0.2 mg via ORAL

## 2016-04-05 MED ORDER — KETOROLAC TROMETHAMINE 60 MG/2ML IM SOLN
60.0000 mg | Freq: Once | INTRAMUSCULAR | Status: AC
  Administered 2016-04-05: 60 mg via INTRAMUSCULAR

## 2016-04-05 MED ORDER — LOSARTAN POTASSIUM-HCTZ 50-12.5 MG PO TABS: 1.0000 | ORAL_TABLET | Freq: Every day | ORAL | 3 refills | Status: DC

## 2016-04-05 MED ORDER — COLCHICINE 0.6 MG PO TABS: 0.6000 mg | ORAL_TABLET | Freq: Every day | ORAL | 0 refills | Status: DC

## 2016-04-05 MED ORDER — ALLOPURINOL 100 MG PO TABS: 100.0000 mg | ORAL_TABLET | Freq: Two times a day (BID) | ORAL | 0 refills | Status: DC

## 2016-04-05 NOTE — Patient Instructions (Signed)
Managing Your Hypertension Hypertension is commonly called high blood pressure. This is when the force of your blood pressing against the walls of your arteries is too strong. Arteries are blood vessels that carry blood from your heart throughout your body. Hypertension forces the heart to work harder to pump blood, and may cause the arteries to become narrow or stiff. Having untreated or uncontrolled hypertension can cause heart attack, stroke, kidney disease, and other problems. What are blood pressure readings? A blood pressure reading consists of a higher number over a lower number. Ideally, your blood pressure should be below 120/80. The first ("top") number is called the systolic pressure. It is a measure of the pressure in your arteries as your heart beats. The second ("bottom") number is called the diastolic pressure. It is a measure of the pressure in your arteries as the heart relaxes. What does my blood pressure reading mean? Blood pressure is classified into four stages. Based on your blood pressure reading, your health care provider may use the following stages to determine what type of treatment you need, if any. Systolic pressure and diastolic pressure are measured in a unit called mm Hg. Normal   Systolic pressure: below 120.  Diastolic pressure: below 80. Elevated   Systolic pressure: 120-129.  Diastolic pressure: below 80. Hypertension stage 1     Diastolic pressure: 80-89. Hypertension stage 2   Systolic pressure: 140 or above.  Diastolic pressure: 90 or above. What health risks are associated with hypertension? Managing your hypertension is an important responsibility. Uncontrolled hypertension can lead to:  A heart attack.  A stroke.  A weakened blood vessel (aneurysm).  Heart failure.  Kidney damage.  Eye damage.  Metabolic syndrome.  Memory and concentration problems. What changes can I make to manage my hypertension? Eating and drinking   Eat a  diet that is high in fiber and potassium, and low in salt (sodium), added sugar, and fat. An example eating plan is called the DASH (Dietary Approaches to Stop Hypertension) diet. To eat this way:  Eat plenty of fresh fruits and vegetables. Try to fill half of your plate at each meal with fruits and vegetables.  Eat whole grains, such as whole wheat pasta, brown rice, or whole grain bread. Fill about one quarter of your plate with whole grains.  Eat low-fat diary products.  Avoid fatty cuts of meat, processed or cured meats, and poultry with skin. Fill about one quarter of your plate with lean proteins such as fish, chicken without skin, beans, eggs, and tofu.  Avoid premade and processed foods. These tend to be higher in sodium, added sugar, and fat.     Lifestyle   Work with your health care provider to maintain a healthy body weight, or to lose weight. Ask what an ideal weight is for you.  Get at least 30 minutes of exercise that causes your heart to beat faster (aerobic exercise) most days of the week. Activities may include walking, swimming, or biking.       Monitoring   Monitor your blood pressure at home as told by your health care provider. Your personal target blood pressure may vary depending on your medical conditions, your age, and other factors.  Have your blood pressure checked regularly, as often as told by your health care provider. Working with your health care provider   Review all the medicines you take with your health care provider because there may be side effects or interactions.  Talk with your health   care provider about your diet, exercise habits, and other lifestyle factors that may be contributing to hypertension.  Visit your health care provider regularly. Your health care provider can help you create and adjust your plan for managing hypertension. Will I need medicine to control my blood pressure? Your health care provider may prescribe medicine if  lifestyle changes are not enough to get your blood pressure under control, and if:  Your systolic blood pressure is 130 or higher.  Your diastolic blood pressure is 80 or higher. Take medicines only as told by your health care provider. Follow the directions carefully. Blood pressure medicines must be taken as prescribed. The medicine does not work as well when you skip doses. Skipping doses also puts you at risk for problems. Contact a health care provider if:  You think you are having a reaction to medicines you have taken.  You have repeated (recurrent) headaches.  You feel dizzy.  You have swelling in your ankles.  You have trouble with your vision. Get help right away if:  You develop a severe headache or confusion.  You have unusual weakness or numbness, or you feel faint.  You have severe pain in your chest or abdomen.  You vomit repeatedly.  You have trouble breathing. Summary  Hypertension is when the force of blood pumping through your arteries is too strong. If this condition is not controlled, it may put you at risk for serious complications.  Your personal target blood pressure may vary depending on your medical conditions, your age, and other factors. For most people, a normal blood pressure is less than 120/80.  Hypertension is managed by lifestyle changes, medicines, or both. Lifestyle changes include weight loss, eating a healthy, low-sodium diet, exercising more, and limiting alcohol. This information is not intended to replace advice given to you by your health care provider. Make sure you discuss any questions you have with your health care provider. Document Released: 10/03/2011 Document Revised: 12/07/2015 Document Reviewed: 12/07/2015 Elsevier Interactive Patient Education  2017 [employer]. Gout Gout is painful swelling that can occur in some of your joints. Gout is a type of arthritis. This condition is caused by having too much uric acid in your  body. Uric acid is a chemical that forms when your body breaks down substances called purines. Purines are important for building body proteins. When your body has too much uric acid, sharp crystals can form and build up inside your joints. This causes pain and swelling. Gout attacks can happen quickly and be very painful (acute gout). Over time, the attacks can affect more joints and become more frequent (chronic gout). Gout can also cause uric acid to build up under your skin and inside your kidneys. What are the causes? This condition is caused by too much uric acid in your blood. This can occur because:  Your kidneys do not remove enough uric acid from your blood. This is the most common cause.  Your body makes too much uric acid. This can occur with some cancers and cancer treatments. It can also occur if your body is breaking down too many red blood cells (hemolytic anemia).  You eat too many foods that are high in purines. These foods include organ meats and some seafood. Alcohol, especially beer, is also high in purines. A gout attack may be triggered by trauma or stress. What increases the risk? This condition is more likely to develop in people who:  Have a family history of gout.  Are male  and middle-aged.  Are male and have gone through menopause.  Are obese.  Frequently drink alcohol, especially beer.  Are dehydrated.  Lose weight too quickly.  Have an organ transplant.  Have lead poisoning.  Take certain medicines, including aspirin, cyclosporine, diuretics, levodopa, and niacin.  Have kidney disease or psoriasis. What are the signs or symptoms? An attack of acute gout happens quickly. It usually occurs in just one joint. The most common place is the big toe. Attacks often start at night. Other joints that may be affected include joints of the feet, ankle, knee, fingers, wrist, or elbow. Symptoms may include:  Severe  pain.  Warmth.  Swelling.  Stiffness.  Tenderness. The affected joint may be very painful to touch.  Shiny, red, or purple skin.  Chills and fever. Chronic gout may cause symptoms more frequently. More joints may be involved. You may also have white or yellow lumps (tophi) on your hands or feet or in other areas near your joints. How is this diagnosed? This condition is diagnosed based on your symptoms, medical history, and physical exam. You may have tests, such as:  Blood tests to measure uric acid levels.  Removal of joint fluid with a needle (aspiration) to look for uric acid crystals.  X-rays to look for joint damage. How is this treated? Treatment for this condition has two phases: treating an acute attack and preventing future attacks. Acute gout treatment may include medicines to reduce pain and swelling, including:  NSAIDs.  Steroids. These are strong anti-inflammatory medicines that can be taken by mouth (orally) or injected into a joint.  Colchicine. This medicine relieves pain and swelling when it is taken soon after an attack. It can be given orally or through an IV tube. Preventive treatment may include:  Daily use of smaller doses of NSAIDs or colchicine.  Use of a medicine that reduces uric acid levels in your blood.  Changes to your diet. You may need to see a specialist about healthy eating (dietitian). Follow these instructions at home: During a Gout Attack   If directed, apply ice to the affected area:  Put ice in a plastic bag.  Place a towel between your skin and the bag.  Leave the ice on for 20 minutes, 2-3 times a day.  Rest the joint as much as possible. If the affected joint is in your leg, you may be given crutches to use.  Raise (elevate) the affected joint above the level of your heart as often as possible.  Drink enough fluids to keep your urine clear or pale yellow.  Take over-the-counter and prescription medicines only as told by  your health care provider.  Do not drive or operate heavy machinery while taking prescription pain medicine.  Follow instructions from your health care provider about eating or drinking restrictions.  Return to your normal activities as told by your health care provider. Ask your health care provider what activities are safe for you. Avoiding Future Gout Attacks   Follow a low-purine diet as told by your dietitian or health care provider. Avoid foods and drinks that are high in purines, including liver, kidney, anchovies, asparagus, herring, mushrooms, mussels, and beer.  Limit alcohol intake to no more than 1 drink a day for nonpregnant women and 2 drinks a day for men. One drink equals 12 oz of beer, 5 oz of wine, or 1 oz of hard liquor.  Maintain a healthy weight or lose weight if you are overweight. If you want  to lose weight, talk with your health care provider. It is important that you do not lose weight too quickly.  Start or maintain an exercise program as told by your health care provider.  Drink enough fluids to keep your urine clear or pale yellow.  Take over-the-counter and prescription medicines only as told by your health care provider.  Keep all follow-up visits as told by your health care provider. This is important. Contact a health care provider if:  You have another gout attack.  You continue to have symptoms of a gout attack after10 days of treatment.  You have side effects from your medicines.  You have chills or a fever.  You have burning pain when you urinate.  You have pain in your lower back or belly. Get help right away if:  You have severe or uncontrolled pain.  You cannot urinate. This information is not intended to replace advice given to you by your health care provider. Make sure you discuss any questions you have with your health care provider. Document Released: 01/06/2000 Document Revised: 06/16/2015 Document Reviewed: 10/21/2014 Elsevier  Interactive Patient Education  2017 [employer].

## 2016-04-05 NOTE — Progress Notes (Signed)
Subjective:  Patient ID: Wayne [name], male    DOB: 02/21/1960  Age: 56 y.o. MRN: [id number]  CC:  Gout of right knee   HPI Wayne [name] is a 56 y.o. male with a PMH of gout. Usually has gout attacks on right hallux MCP and right knee. Right knee is swollen, with mildly increased warmth, with limited aROM, and with TTP. He is found to be hypertensive in clinic and patient admits he has not taken antihypertensives in some time. Denies chest pain, SOB, HA, f/c/n/v, abdominal pain, rash, or GI/GU sxs.      Outpatient Medications Prior to Visit  Medication Sig Dispense Refill  . meloxicam (MOBIC) 15 MG tablet Take 15 mg by mouth daily.    Marland Kitchen HYDROcodone-acetaminophen (NORCO/VICODIN) 5-325 MG per tablet Take 1-2 tablets by mouth every 6 (six) hours as needed. (Patient not taking: Reported on 04/05/2016) 15 tablet 0  . methocarbamol (ROBAXIN) 500 MG tablet Take 1 tablet (500 mg total) by mouth 2 (two) times daily. (Patient not taking: Reported on 04/05/2016) 20 tablet 0  . naproxen (NAPROSYN) 500 MG tablet Take 1 tablet (500 mg total) by mouth 2 (two) times daily. (Patient not taking: Reported on 04/05/2016) 30 tablet 0  . traMADol (ULTRAM) 50 MG tablet Take 1 tablet (50 mg total) by mouth every 6 (six) hours as needed for pain. (Patient not taking: Reported on 04/05/2016) 15 tablet 0   No facility-administered medications prior to visit.      ROS Review of Systems  Constitutional: Negative for chills, fever and malaise/fatigue.  Eyes: Negative for blurred vision.  Respiratory: Negative for shortness of breath.   Cardiovascular: Negative for chest pain and palpitations.  Gastrointestinal: Negative for abdominal pain and nausea.  Genitourinary: Negative for dysuria and hematuria.  Musculoskeletal: Positive for joint pain. Negative for myalgias.  Skin: Negative for rash.  Neurological: Negative for tingling and headaches.  Psychiatric/Behavioral: Negative for depression. The patient is  not nervous/anxious.     Objective:  BP (!) 136/91   Pulse 83   Temp 98.1 F (36.7 C) (Oral)   Ht 6\' 2"  (1.88 m)   Wt 258 lb 3.2 oz (117.1 kg)   SpO2 98%   BMI 33.15 kg/m   BP/Weight 04/05/2016 50/0/9381 08/24/9935  Systolic BP 169 678 938  Diastolic BP 91 96 91  Wt. (Lbs) 258.2 - -  BMI 33.15 - -      Physical Exam  Constitutional: He is oriented to person, place, and time.  Well developed, well nourished, NAD, polite  HENT:  Head: Normocephalic and atraumatic.  Eyes: No scleral icterus.  Neck: Normal range of motion. Neck supple. No thyromegaly present.  Cardiovascular: Normal rate, regular rhythm and normal heart sounds.   Pulmonary/Chest: Effort normal and breath sounds normal.  Abdominal: Soft. Bowel sounds are normal. There is no tenderness.  Musculoskeletal: He exhibits no edema.  Right knee is swollen, with mildly increased warmth, with limited aROM, and with TTP.  Neurological: He is alert and oriented to person, place, and time.  Skin: Skin is warm and dry. No rash noted. No erythema. No pallor.  Psychiatric: He has a normal mood and affect. His behavior is normal. Thought content normal.  Vitals reviewed.    Assessment & Plan:   1. Acute gout of right knee, unspecified cause - ketorolac (TORADOL) injection 60 mg once in clinic - Colchicine 0.6mg  - Allopurinol to be taken after complete resolution of right knee pain/swelling.  - Pt  declined arthrocentesis  2. Hypertension, unspecified type - Losartan/HCTZ 50-12.5 mg daily - cloNIDine (CATAPRES) tablet 0.2 mg once in clinic.   Follow-up: Return in about 4 weeks (around 05/03/2016) for BP check.   Clent Demark PA

## 2016-04-06 ENCOUNTER — Telehealth (INDEPENDENT_AMBULATORY_CARE_PROVIDER_SITE_OTHER): Payer: Self-pay | Admitting: Physician Assistant

## 2016-04-06 NOTE — Telephone Encounter (Signed)
Patient left voice mail message at 11:09 am stated RX to expensive and didn't receive Rx for blood pressure.  Please call back at [phone]

## 2016-04-09 NOTE — Telephone Encounter (Signed)
Please call and get more detail so I know which rx to change.

## 2016-04-10 ENCOUNTER — Other Ambulatory Visit (INDEPENDENT_AMBULATORY_CARE_PROVIDER_SITE_OTHER): Payer: Self-pay | Admitting: Physician Assistant

## 2016-04-10 DIAGNOSIS — M109 Gout, unspecified: Secondary | ICD-10-CM

## 2016-04-10 MED ORDER — IBUPROFEN 800 MG PO TABS: 800.0000 mg | ORAL_TABLET | Freq: Three times a day (TID) | ORAL | 0 refills | Status: DC | PRN

## 2016-04-10 NOTE — Telephone Encounter (Signed)
I spoke to patient and changed his colchicine to ibuprofen. In regards to anti-hypertensives, I confirmed his pharmacy and his AVS.  AVS states his allopurinol and Losartan/HCTZ were sent to his pharmacy. Patient states he will check with pharmacy again.

## 2016-04-10 NOTE — Progress Notes (Signed)
Pt could not afford Colchicine for gout. He prefers to take Ibuprofen 800mg . Rx sent.

## 2016-04-10 NOTE — Telephone Encounter (Signed)
Spoke with patient, he is referring to the colchicine. $90 at [facility] all other pharmacies expensive as well. Patient also stated he never received an Rx for the blood pressure medication. Informed patient he may need to switch to [facility] pharmacy if having financial troubles paying for prescriptions. Please advise. Nat Christen, CMA

## 2016-05-03 ENCOUNTER — Ambulatory Visit (INDEPENDENT_AMBULATORY_CARE_PROVIDER_SITE_OTHER): Payer: Self-pay | Admitting: Physician Assistant

## 2016-05-14 ENCOUNTER — Ambulatory Visit (INDEPENDENT_AMBULATORY_CARE_PROVIDER_SITE_OTHER): Payer: Self-pay | Admitting: Physician Assistant

## 2016-05-14 ENCOUNTER — Encounter (INDEPENDENT_AMBULATORY_CARE_PROVIDER_SITE_OTHER): Payer: Self-pay | Admitting: Physician Assistant

## 2016-05-14 VITALS — BP 142/98 | HR 71 | Temp 98.0°F | Ht 74.0 in | Wt 258.4 lb

## 2016-05-14 DIAGNOSIS — G8929 Other chronic pain: Secondary | ICD-10-CM

## 2016-05-14 DIAGNOSIS — Z1211 Encounter for screening for malignant neoplasm of colon: Secondary | ICD-10-CM

## 2016-05-14 DIAGNOSIS — Z114 Encounter for screening for human immunodeficiency virus [HIV]: Secondary | ICD-10-CM

## 2016-05-14 DIAGNOSIS — M25562 Pain in left knee: Secondary | ICD-10-CM

## 2016-05-14 DIAGNOSIS — M109 Gout, unspecified: Secondary | ICD-10-CM

## 2016-05-14 DIAGNOSIS — Z23 Encounter for immunization: Secondary | ICD-10-CM

## 2016-05-14 DIAGNOSIS — I10 Essential (primary) hypertension: Secondary | ICD-10-CM

## 2016-05-14 DIAGNOSIS — M25561 Pain in right knee: Secondary | ICD-10-CM

## 2016-05-14 MED ORDER — LOSARTAN POTASSIUM 100 MG PO TABS: 100.0000 mg | ORAL_TABLET | Freq: Every day | ORAL | 3 refills | Status: DC

## 2016-05-14 MED ORDER — PREDNISONE 20 MG PO TABS: 60.0000 mg | ORAL_TABLET | Freq: Every day | ORAL | 0 refills | Status: DC

## 2016-05-14 MED ORDER — AMLODIPINE BESYLATE 10 MG PO TABS: 10.0000 mg | ORAL_TABLET | Freq: Every day | ORAL | 1 refills | Status: DC

## 2016-05-14 MED ORDER — IBUPROFEN 800 MG PO TABS: 800.0000 mg | ORAL_TABLET | Freq: Three times a day (TID) | ORAL | 0 refills | Status: DC | PRN

## 2016-05-14 MED ORDER — TRAMADOL HCL 50 MG PO TABS: 50.0000 mg | ORAL_TABLET | Freq: Three times a day (TID) | ORAL | 0 refills | Status: DC | PRN

## 2016-05-14 NOTE — Progress Notes (Signed)
Subjective:  Patient ID: Wayne [name], male    DOB: 05/29/1960  Age: 56 y.o. MRN: [id number]  CC: bilateral knee pain  HPI Wayne [name] is a 56 y.o. male with a PMH of HTN and gout presents on f/u for the same. Patient unable to pick up colchicine due to cost. Has been taking. Allopurinol despite medical education given at previous visit. Admits to eating some sea food. Rarely drinks alcohol. Onset of bilateral knee pain approximately 2008. Right knee pain worse than left. Uses cane intermittently to ambulate. Sometimes uses crutches when gout atacks toe or foot. Does not endorse any other symptoms.   Review of Systems  Constitutional: Negative for chills, fever and malaise/fatigue.  Eyes: Negative for blurred vision.  Respiratory: Negative for shortness of breath.   Cardiovascular: Negative for chest pain and palpitations.  Gastrointestinal: Negative for abdominal pain and nausea.  Genitourinary: Negative for dysuria and hematuria.  Musculoskeletal: Positive for joint pain. Negative for myalgias.  Skin: Negative for rash.  Neurological: Negative for tingling and headaches.  Psychiatric/Behavioral: Negative for depression. The patient is not nervous/anxious.     Objective:  BP (!) 142/98   Pulse 71   Temp 98 F (36.7 C) (Oral)   Ht 6\' 2"  (1.88 m)   Wt 258 lb 6.4 oz (117.2 kg)   SpO2 99%   BMI 33.18 kg/m   BP/Weight 05/14/2016 04/05/2016 06/23/3760  Systolic BP 831 517 616  Diastolic BP 98 91 96  Wt. (Lbs) 258.4 258.2 -  BMI 33.18 33.15 -      Physical Exam  Constitutional: He is oriented to person, place, and time.  Well developed, overweight, NAD, polite  HENT:  Head: Normocephalic and atraumatic.  Eyes: No scleral icterus.  Neck: Normal range of motion. Neck supple. No thyromegaly present.  Cardiovascular: Normal rate, regular rhythm and normal heart sounds.   Pulmonary/Chest: Effort normal and breath sounds normal.  Musculoskeletal: He exhibits no edema.   Limp favoring R > L. Bony hypertrophy of the knee bilaterally. aROM of knee limited 2/2 pain  Neurological: He is alert and oriented to person, place, and time. No cranial nerve deficit.  Skin: Skin is warm and dry. No rash noted. No erythema. No pallor.  Psychiatric: He has a normal mood and affect. His behavior is normal. Thought content normal.  Vitals reviewed.    Assessment & Plan:   1. Hypertension, unspecified type - losartan (COZAAR) 100 MG tablet; Take 1 tablet (100 mg total) by mouth daily.  Dispense: 90 tablet; Refill: 3 - amLODipine (NORVASC) 10 MG tablet; Take 1 tablet (10 mg total) by mouth daily.  Dispense: 90 tablet; Refill: 1 - Stopped HCTZ due to uncontrolled gout.  2. Chronic pain of both knees - traMADol (ULTRAM) 50 MG tablet; Take 1 tablet (50 mg total) by mouth every 8 (eight) hours as needed.  Dispense: 30 tablet; Refill: 0 - DG Knee Complete 4 Views Right; Future - DG Knee Complete 4 Views Left; Future  3. Gout, unspecified cause, unspecified chronicity, unspecified site - Written instruction given to patient about how to take medications for gout treatment. - ibuprofen (ADVIL,MOTRIN) 800 MG tablet; Take 1 tablet (800 mg total) by mouth every 8 (eight) hours as needed.  Dispense: 30 tablet; Refill: 0 - predniSONE (DELTASONE) 20 MG tablet; Take 3 tablets (60 mg total) by mouth daily with breakfast.  Dispense: 21 tablet; Refill: 0  4. Encounter for screening colonoscopy - Ambulatory referral to Gastroenterology  5. Encounter  for screening for HIV - HIV  6. Need for Tdap vaccination - Tdap vaccine greater than or equal to 7yo IM   Meds ordered this encounter  Medications  . ibuprofen (ADVIL,MOTRIN) 800 MG tablet    Sig: Take 1 tablet (800 mg total) by mouth every 8 (eight) hours as needed.    Dispense:  30 tablet    Refill:  0    Order Specific Question:   Supervising Provider    Answer:   Tresa Garter [id number]  . traMADol (ULTRAM) 50 MG  tablet    Sig: Take 1 tablet (50 mg total) by mouth every 8 (eight) hours as needed.    Dispense:  30 tablet    Refill:  0    Order Specific Question:   Supervising Provider    Answer:   Tresa Garter [id number]  . predniSONE (DELTASONE) 20 MG tablet    Sig: Take 3 tablets (60 mg total) by mouth daily with breakfast.    Dispense:  21 tablet    Refill:  0    Order Specific Question:   Supervising Provider    Answer:   Tresa Garter [id number]  . losartan (COZAAR) 100 MG tablet    Sig: Take 1 tablet (100 mg total) by mouth daily.    Dispense:  90 tablet    Refill:  3    Order Specific Question:   Supervising Provider    Answer:   Tresa Garter [id number]  . amLODipine (NORVASC) 10 MG tablet    Sig: Take 1 tablet (10 mg total) by mouth daily.    Dispense:  90 tablet    Refill:  1    Order Specific Question:   Supervising Provider    Answer:   Tresa Garter [id number]    Follow-up: Return in about 4 weeks (around 06/11/2016) for HTN and gout.   Clent Demark PA

## 2016-05-14 NOTE — Patient Instructions (Signed)
Please take your Prednisone as directed starting today. Suspend Ibuprofen until you finish your prednisone. Take Allopurinol only when the gout pain has resolved. Follow a low purine diet.    Low-Purine Diet Purines are compounds that affect the level of uric acid in your body. A low-purine diet is a diet that is low in purines. Eating a low-purine diet can prevent the level of uric acid in your body from getting too high and causing gout or kidney stones or both. What do I need to know about this diet?  Choose low-purine foods. Examples of low-purine foods are listed in the next section.  Drink plenty of fluids, especially water. Fluids can help remove uric acid from your body. Try to drink 8-16 cups (1.9-3.8 L) a day.  Limit foods high in fat, especially saturated fat, as fat makes it harder for the body to get rid of uric acid. Foods high in saturated fat include pizza, cheese, ice cream, whole milk, fried foods, and gravies. Choose foods that are lower in fat and lean sources of protein. Use olive oil when cooking as it contains healthy fats that are not high in saturated fat.  Limit alcohol. Alcohol interferes with the elimination of uric acid from your body. If you are having a gout attack, avoid all alcohol.  Keep in mind that different people's bodies react differently to different foods. You will probably learn over time which foods do or do not affect you. If you discover that a food tends to cause your gout to flare up, avoid eating that food. You can more freely enjoy foods that do not cause problems. If you have any questions about a food item, talk to your dietitian or health care provider. Which foods are low, moderate, and high in purines? The following is a list of foods that are low, moderate, and high in purines. You can eat any amount of the foods that are low in purines. You may be able to have small amounts of foods that are moderate in purines. Ask your health care provider  how much of a food moderate in purines you can have. Avoid foods high in purines. Grains   Foods low in purines: Enriched white bread, pasta, rice, cake, cornbread, popcorn.  Foods moderate in purines: Whole-grain breads and cereals, wheat germ, bran, oatmeal. Uncooked oatmeal. Dry wheat bran or wheat germ.  Foods high in purines: Pancakes, [place] toast, biscuits, muffins. Vegetables   Foods low in purines: All vegetables, except those that are moderate in purines.  Foods moderate in purines: Asparagus, cauliflower, spinach, mushrooms, green peas. Fruits   All fruits are low in purines. Meats and other Protein Foods   Foods low in purines: Eggs, nuts, peanut butter.  Foods moderate in purines: 80-90% lean beef, lamb, veal, pork, poultry, fish, eggs, peanut butter, nuts. Crab, lobster, oysters, and shrimp. Cooked dried beans, peas, and lentils.  Foods high in purines: Anchovies, sardines, herring, mussels, tuna, codfish, scallops, trout, and haddock. Berniece Salines. Organ meats (such as liver or kidney). Tripe. Game meat. Goose. Sweetbreads. Dairy   All dairy foods are low in purines. Low-fat and fat-free dairy products are best because they are low in saturated fat. Beverages   Drinks low in purines: Water, carbonated beverages, tea, coffee, cocoa.  Drinks moderate in purines: Soft drinks and other drinks sweetened with high-fructose corn syrup. Juices. To find whether a food or drink is sweetened with high-fructose corn syrup, look at the ingredients list.  Drinks high in purines:  Alcoholic beverages (such as beer). Condiments   Foods low in purines: Salt, herbs, olives, pickles, relishes, vinegar.  Foods moderate in purines: Butter, margarine, oils, mayonnaise. Fats and Oils   Foods low in purines: All types, except gravies and sauces made with meat.  Foods high in purines: Gravies and sauces made with meat. Other Foods   Foods low in purines: Sugars, sweets, gelatin. Cake.  Soups made without meat.  Foods moderate in purines: Meat-based or fish-based soups, broths, or bouillons. Foods and drinks sweetened with high-fructose corn syrup.  Foods high in purines: High-fat desserts (such as ice cream, cookies, cakes, pies, doughnuts, and chocolate). Contact your dietitian for more information on foods that are not listed here.  This information is not intended to replace advice given to you by your health care provider. Make sure you discuss any questions you have with your health care provider. Document Released: 05/05/2010 Document Revised: 06/16/2015 Document Reviewed: 12/15/2012 Elsevier Interactive Patient Education  2017 [employer].

## 2016-05-15 ENCOUNTER — Ambulatory Visit (HOSPITAL_COMMUNITY)
Admission: RE | Admit: 2016-05-15 | Discharge: 2016-05-15 | Disposition: A | Discharge status: Home / Self Care | Payer: Self-pay | Source: Ambulatory Visit | Attending: Physician Assistant | Admitting: Physician Assistant

## 2016-05-15 DIAGNOSIS — G8929 Other chronic pain: Secondary | ICD-10-CM | POA: Insufficient documentation

## 2016-05-15 DIAGNOSIS — M25461 Effusion, right knee: Secondary | ICD-10-CM | POA: Insufficient documentation

## 2016-05-15 DIAGNOSIS — M25561 Pain in right knee: Secondary | ICD-10-CM | POA: Insufficient documentation

## 2016-05-15 DIAGNOSIS — M8580 Other specified disorders of bone density and structure, unspecified site: Secondary | ICD-10-CM | POA: Insufficient documentation

## 2016-05-15 DIAGNOSIS — M25562 Pain in left knee: Secondary | ICD-10-CM | POA: Insufficient documentation

## 2016-05-15 LAB — HIV ANTIBODY (ROUTINE TESTING W REFLEX): HIV Screen 4th Generation wRfx: NONREACTIVE

## 2016-05-17 ENCOUNTER — Other Ambulatory Visit (INDEPENDENT_AMBULATORY_CARE_PROVIDER_SITE_OTHER): Payer: Self-pay | Admitting: Physician Assistant

## 2016-05-17 DIAGNOSIS — M25461 Effusion, right knee: Secondary | ICD-10-CM

## 2016-05-17 NOTE — Progress Notes (Signed)
Knee effusion, right side. Degenerative changes bilaterally.

## 2016-05-22 ENCOUNTER — Ambulatory Visit (INDEPENDENT_AMBULATORY_CARE_PROVIDER_SITE_OTHER): Payer: Self-pay | Admitting: Orthopaedic Surgery

## 2016-05-25 ENCOUNTER — Ambulatory Visit (INDEPENDENT_AMBULATORY_CARE_PROVIDER_SITE_OTHER): Payer: Self-pay | Admitting: Orthopaedic Surgery

## 2016-05-25 ENCOUNTER — Encounter (INDEPENDENT_AMBULATORY_CARE_PROVIDER_SITE_OTHER): Payer: Self-pay | Admitting: Orthopaedic Surgery

## 2016-05-25 DIAGNOSIS — M1711 Unilateral primary osteoarthritis, right knee: Secondary | ICD-10-CM

## 2016-05-25 DIAGNOSIS — M1712 Unilateral primary osteoarthritis, left knee: Secondary | ICD-10-CM

## 2016-05-25 MED ORDER — DICLOFENAC SODIUM 75 MG PO TBEC: 75.0000 mg | DELAYED_RELEASE_TABLET | Freq: Two times a day (BID) | ORAL | 2 refills | Status: DC

## 2016-05-25 NOTE — Progress Notes (Signed)
Office Visit Note   Patient: ROYDEN BULMAN           Date of Birth: [birth year]           MRN: [id number] Visit Date: 05/25/2016              Requested by: Clent Demark, PA-C [place], [place] [id number] PCP: Clent Demark, PA-C   Assessment & Plan: Visit Diagnoses:  1. Unilateral primary osteoarthritis, left knee   2. Unilateral primary osteoarthritis, right knee     Plan: I reviewed his x-rays and canopy which does show moderate to severe osteoarthritis. He does not have any significant deformities. I did offer him cortisone injections which she will think about. I gave him a prescription for diclofenac. I'll see him back as needed.  Follow-Up Instructions: Return if symptoms worsen or fail to improve.   Orders:  No orders of the defined types were placed in this encounter.  Meds ordered this encounter  Medications  . diclofenac (VOLTAREN) 75 MG EC tablet    Sig: Take 1 tablet (75 mg total) by mouth 2 (two) times daily.    Dispense:  30 tablet    Refill:  2      Procedures: No procedures performed   Clinical Data: No additional findings.   Subjective: Chief Complaint  Patient presents with  . Right Knee - Pain  . Left Knee - Pain    Mr. Wettstein is a very pleasant 56 year old gentleman comes in with bilateral knee pain worse on the right. This is been going on for years. The pain is constant and is worse with activity. He wears braces and occasionally walks with a cane. He is not diabetic. Tramadol gives him some relief. He does have history of gout which improved with prednisone. He denies any swelling. Denies a constitutional symptoms or radiation of pain.    Review of Systems  Constitutional: Negative.   All other systems reviewed and are negative.    Objective: Vital Signs: There were no vitals taken for this visit.  Physical Exam  Constitutional: He is oriented to person, place, and time. He appears well-developed and  well-nourished.  HENT:  Head: Normocephalic and atraumatic.  Eyes: Pupils are equal, round, and reactive to light.  Neck: Neck supple.  Pulmonary/Chest: Effort normal.  Abdominal: Soft.  Musculoskeletal: Normal range of motion.  Neurological: He is alert and oriented to person, place, and time.  Skin: Skin is warm.  Psychiatric: He has a normal mood and affect. His behavior is normal. Judgment and thought content normal.  Nursing note and vitals reviewed.   Ortho Exam Bilateral knee exam shows no joint effusion. He has good range of motion. Collaterals and cruciates are stable. No skin lesions. Specialty Comments:  No specialty comments available.  Imaging: No results found.   PMFS History: Patient Active Problem List   Diagnosis Date Noted  . Unilateral primary osteoarthritis, left knee 05/25/2016  . Unilateral primary osteoarthritis, right knee 05/25/2016   Past Medical History:  Diagnosis Date  . High cholesterol   . Hypertension     No family history on file.  Past Surgical History:  Procedure Laterality Date  . KNEE ARTHROSCOPY    . LUMBAR DISC SURGERY     Social History   Occupational History  . Not on file.   Social History Main Topics  . Smoking status: Current Every Day Smoker    Packs/day: 1.00    Types: Cigarettes  .  Smokeless tobacco: Never Used  . Alcohol use 8.4 oz/week    14 Cans of beer per week  . Drug use: No  . Sexual activity: Not on file

## 2016-06-11 ENCOUNTER — Encounter (INDEPENDENT_AMBULATORY_CARE_PROVIDER_SITE_OTHER): Payer: Self-pay | Admitting: Physician Assistant

## 2016-06-11 ENCOUNTER — Ambulatory Visit (INDEPENDENT_AMBULATORY_CARE_PROVIDER_SITE_OTHER): Payer: Self-pay | Admitting: Physician Assistant

## 2016-06-11 VITALS — BP 159/93 | HR 68 | Temp 97.9°F | Wt 257.0 lb

## 2016-06-11 DIAGNOSIS — M1A0420 Idiopathic chronic gout, left hand, without tophus (tophi): Secondary | ICD-10-CM

## 2016-06-11 DIAGNOSIS — M1711 Unilateral primary osteoarthritis, right knee: Secondary | ICD-10-CM

## 2016-06-11 DIAGNOSIS — M1712 Unilateral primary osteoarthritis, left knee: Secondary | ICD-10-CM

## 2016-06-11 DIAGNOSIS — I10 Essential (primary) hypertension: Secondary | ICD-10-CM

## 2016-06-11 MED ORDER — ISOSORBIDE MONONITRATE ER 30 MG PO TB24: 30.0000 mg | ORAL_TABLET | Freq: Every day | ORAL | 3 refills | Status: DC

## 2016-06-11 MED ORDER — DICLOFENAC SODIUM 75 MG PO TBEC: 75.0000 mg | DELAYED_RELEASE_TABLET | Freq: Two times a day (BID) | ORAL | 1 refills | Status: DC | PRN

## 2016-06-11 MED ORDER — ALLOPURINOL 100 MG PO TABS: 100.0000 mg | ORAL_TABLET | Freq: Two times a day (BID) | ORAL | 5 refills | Status: DC

## 2016-06-11 NOTE — Patient Instructions (Signed)
Arthritis Arthritis is a term that is commonly used to refer to joint pain or joint disease. There are more than 100 types of arthritis. What are the causes? The most common cause of this condition is wear and tear of a joint. Other causes include:  Gout.  Inflammation of a joint.  An infection of a joint.  Sprains and other injuries near the joint.  A drug reaction or allergic reaction. In some cases, the cause may not be known. What are the signs or symptoms? The main symptom of this condition is pain in the joint with movement. Other symptoms include:  Redness, swelling, or stiffness at a joint.  Warmth coming from the joint.  Fever.  Overall feeling of illness. How is this diagnosed? This condition may be diagnosed with a physical exam and tests, including:  Blood tests.  Urine tests.  Imaging tests, such as MRI, X-rays, or a CT scan. Sometimes, fluid is removed from a joint for testing. How is this treated? Treatment for this condition may involve:  Treatment of the cause, if it is known.  Rest.  Raising (elevating) the joint.  Applying cold or hot packs to the joint.  Medicines to improve symptoms and reduce inflammation.  Injections of a steroid such as cortisone into the joint to help reduce pain and inflammation. Depending on the cause of your arthritis, you may need to make lifestyle changes to reduce stress on your joint. These changes may include exercising more and losing weight. Follow these instructions at home: Medicines   Take over-the-counter and prescription medicines only as told by your health care provider.  Do not take aspirin to relieve pain if gout is suspected. Activity   Rest your joint if told by your health care provider. Rest is important when your disease is active and your joint feels painful, swollen, or stiff.  Avoid activities that make the pain worse. It is important to balance activity with rest.  Exercise your joint  regularly with range-of-motion exercises as told by your health care provider. Try doing low-impact exercise, such as:  Swimming.  Water aerobics.  Biking.  Walking. Joint Care    If your joint is swollen, keep it elevated if told by your health care provider.  If your joint feels stiff in the morning, try taking a warm shower.  If directed, apply heat to the joint. If you have diabetes, do not apply heat without permission from your health care provider.  Put a towel between the joint and the hot pack or heating pad.  Leave the heat on the area for 20-30 minutes.  If directed, apply ice to the joint:  Put ice in a plastic bag.  Place a towel between your skin and the bag.  Leave the ice on for 20 minutes, 2-3 times per day.  Keep all follow-up visits as told by your health care provider. This is important. Contact a health care provider if:  The pain gets worse.  You have a fever. Get help right away if:  You develop severe joint pain, swelling, or redness.  Many joints become painful and swollen.  You develop severe back pain.  You develop severe weakness in your leg.  You cannot control your bladder or bowels. This information is not intended to replace advice given to you by your health care provider. Make sure you discuss any questions you have with your health care provider. Document Released: 02/16/2004 Document Revised: 06/16/2015 Document Reviewed: 04/05/2014 Elsevier Interactive Patient Education    2017 [employer] Inc[employer]  

## 2016-06-11 NOTE — Progress Notes (Signed)
Subjective:  Patient ID: Wayne [name], male    DOB: November 03, 1960  Age: 56 y.o. MRN: [id number]  CC: f/u bilateral knee pain and gout  HPI Wayne [name] is a 56 y.o. male with a PMH of HTN, hypercholesterolemia, and gout. Presents to f/u on knee pain. Saw orthopedic specialist which recommended knee injections for his bilateral osteoarthritis but patient declined and will think about having injections done at another time. Says he is currently doing well on diclofenac, about "90%" better.    Found to be hypertensive today. States he is taking Amlodipine and Losartan as directed daily. Denies CP, SOB, HA, LE swelling, abdominal pain, f/c/n/v, or GI/GU sxs.     Outpatient Medications Prior to Visit  Medication Sig Dispense Refill  . amLODipine (NORVASC) 10 MG tablet Take 1 tablet (10 mg total) by mouth daily. 90 tablet 1  . ibuprofen (ADVIL,MOTRIN) 800 MG tablet Take 1 tablet (800 mg total) by mouth every 8 (eight) hours as needed. 30 tablet 0  . losartan (COZAAR) 100 MG tablet Take 1 tablet (100 mg total) by mouth daily. 90 tablet 3  . predniSONE (DELTASONE) 20 MG tablet Take 3 tablets (60 mg total) by mouth daily with breakfast. 21 tablet 0  . traMADol (ULTRAM) 50 MG tablet Take 1 tablet (50 mg total) by mouth every 8 (eight) hours as needed. 30 tablet 0  . allopurinol (ZYLOPRIM) 100 MG tablet Take 1 tablet (100 mg total) by mouth 2 (two) times daily. 60 tablet 0  . diclofenac (VOLTAREN) 75 MG EC tablet Take 1 tablet (75 mg total) by mouth 2 (two) times daily. 30 tablet 2   No facility-administered medications prior to visit.      ROS Review of Systems  Constitutional: Negative for chills, fever and malaise/fatigue.  Eyes: Negative for blurred vision.  Respiratory: Negative for shortness of breath.   Cardiovascular: Negative for chest pain and palpitations.  Gastrointestinal: Negative for abdominal pain and nausea.  Genitourinary: Negative for dysuria and hematuria.   Musculoskeletal: Positive for joint pain. Negative for myalgias.  Skin: Negative for rash.  Neurological: Negative for tingling and headaches.  Psychiatric/Behavioral: Negative for depression. The patient is not nervous/anxious.     Objective:  BP (!) 159/93 (BP Location: Left Arm, Patient Position: Sitting, Cuff Size: Normal)   Pulse 68   Temp 97.9 F (36.6 C) (Oral)   Wt 257 lb (116.6 kg)   SpO2 96%   BMI 33.00 kg/m   BP/Weight 06/11/2016 05/14/2016 0/81/4481  Systolic BP 856 314 970  Diastolic BP 93 98 91  Wt. (Lbs) 257 258.4 258.2  BMI 33 33.18 33.15      Physical Exam  Constitutional: He is oriented to person, place, and time.  Well developed, well nourished, NAD, polite  HENT:  Head: Normocephalic and atraumatic.  Eyes: No scleral icterus.  Neck: Normal range of motion. Neck supple. No thyromegaly present.  Cardiovascular: Normal rate, regular rhythm and normal heart sounds.   Pulmonary/Chest: Effort normal and breath sounds normal.  Abdominal: Soft. Bowel sounds are normal. There is no tenderness.  Musculoskeletal: He exhibits no edema.  Moderate bony hypertrophic changes of the knee bilaterally  Neurological: He is alert and oriented to person, place, and time.  Skin: Skin is warm and dry. No rash noted. No erythema. No pallor.  Psychiatric: He has a normal mood and affect. His behavior is normal. Thought content normal.  Vitals reviewed.    Assessment & Plan:   1. Chronic gout  of left hand, unspecified cause - No current flare. Advised to take NSAIDs for acute flares and abstain from allopurinol during acute flares. - Refill allopurinol (ZYLOPRIM) 100 MG tablet; Take 1 tablet (100 mg total) by mouth 2 (two) times daily.  Dispense: 60 tablet; Refill: 5  2. Unilateral primary osteoarthritis, right knee - Refill diclofenac (VOLTAREN) 75 MG EC tablet; Take 1 tablet (75 mg total) by mouth 2 (two) times daily as needed.  Dispense: 60 tablet; Refill: 1  3.  Unilateral primary osteoarthritis, left knee - Refill diclofenac (VOLTAREN) 75 MG EC tablet; Take 1 tablet (75 mg total) by mouth 2 (two) times daily as needed.  Dispense: 60 tablet; Refill: 1  4. Hypertension, unspecified type - Begin Imdur 30 mg qday - Refill Losartan - Refill Amlodipine   Meds ordered this encounter  Medications  . diclofenac (VOLTAREN) 75 MG EC tablet    Sig: Take 1 tablet (75 mg total) by mouth 2 (two) times daily as needed.    Dispense:  60 tablet    Refill:  1    Order Specific Question:   Supervising Provider    Answer:   Tresa Garter [id number]  . allopurinol (ZYLOPRIM) 100 MG tablet    Sig: Take 1 tablet (100 mg total) by mouth 2 (two) times daily.    Dispense:  60 tablet    Refill:  5    Order Specific Question:   Supervising Provider    Answer:   Tresa Garter [id number]  . isosorbide mononitrate (IMDUR) 30 MG 24 hr tablet    Sig: Take 1 tablet (30 mg total) by mouth daily.    Dispense:  30 tablet    Refill:  3    Order Specific Question:   Supervising Provider    Answer:   Tresa Garter [id number]    Follow-up: Return in about 2 weeks (around 06/25/2016) for BP nurse visit.Clent Demark PA

## 2016-07-10 ENCOUNTER — Ambulatory Visit (INDEPENDENT_AMBULATORY_CARE_PROVIDER_SITE_OTHER): Payer: Self-pay | Admitting: Physician Assistant

## 2016-07-10 ENCOUNTER — Encounter (INDEPENDENT_AMBULATORY_CARE_PROVIDER_SITE_OTHER): Payer: Self-pay

## 2016-07-10 VITALS — BP 160/84 | HR 68 | Temp 98.2°F

## 2016-07-10 DIAGNOSIS — I10 Essential (primary) hypertension: Secondary | ICD-10-CM

## 2016-07-10 MED ORDER — ISOSORBIDE MONONITRATE ER 60 MG PO TB24: 60.0000 mg | ORAL_TABLET | Freq: Every day | ORAL | 3 refills | Status: DC

## 2016-07-10 MED ORDER — HYDRALAZINE HCL 10 MG PO TABS: 10.0000 mg | ORAL_TABLET | Freq: Three times a day (TID) | ORAL | 2 refills | Status: DC

## 2016-07-10 NOTE — Progress Notes (Signed)
I called and spoke to patient. I discussed his treatment regimen. Will consider Renal artery [place] if insufficient decrease in BP with new regimen.

## 2016-07-30 ENCOUNTER — Encounter (INDEPENDENT_AMBULATORY_CARE_PROVIDER_SITE_OTHER): Payer: Self-pay | Admitting: Physician Assistant

## 2016-07-30 ENCOUNTER — Ambulatory Visit (INDEPENDENT_AMBULATORY_CARE_PROVIDER_SITE_OTHER): Payer: Self-pay | Admitting: Physician Assistant

## 2016-07-30 ENCOUNTER — Telehealth (INDEPENDENT_AMBULATORY_CARE_PROVIDER_SITE_OTHER): Payer: Self-pay | Admitting: Physician Assistant

## 2016-07-30 VITALS — BP 144/86 | HR 70 | Temp 98.2°F | Wt 255.8 lb

## 2016-07-30 DIAGNOSIS — Z760 Encounter for issue of repeat prescription: Secondary | ICD-10-CM

## 2016-07-30 DIAGNOSIS — I10 Essential (primary) hypertension: Secondary | ICD-10-CM

## 2016-07-30 DIAGNOSIS — M109 Gout, unspecified: Secondary | ICD-10-CM

## 2016-07-30 MED ORDER — ISOSORBIDE MONONITRATE ER 60 MG PO TB24: 60.0000 mg | ORAL_TABLET | Freq: Every day | ORAL | 3 refills | Status: DC

## 2016-07-30 MED ORDER — IBUPROFEN 800 MG PO TABS: 800.0000 mg | ORAL_TABLET | Freq: Three times a day (TID) | ORAL | 0 refills | Status: DC | PRN

## 2016-07-30 MED ORDER — AMLODIPINE BESYLATE 10 MG PO TABS: 10.0000 mg | ORAL_TABLET | Freq: Every day | ORAL | 1 refills | Status: DC

## 2016-07-30 MED ORDER — LOSARTAN POTASSIUM 100 MG PO TABS: 100.0000 mg | ORAL_TABLET | Freq: Every day | ORAL | 3 refills | Status: DC

## 2016-07-30 MED ORDER — HYDRALAZINE HCL 10 MG PO TABS: 10.0000 mg | ORAL_TABLET | Freq: Three times a day (TID) | ORAL | 2 refills | Status: DC

## 2016-07-30 NOTE — Progress Notes (Signed)
Subjective:  Patient ID: Wayne [name], male    DOB: [birth year]  Age: 56 y.o. MRN: [id number]  CC: BP check  HPI Wayne [name] is a 56 y.o. male with a PMH of HTN and HLD presents for a nurse visit to have BP checked. Requests Ibuprofen 800 mg refill for gout.     Outpatient Medications Prior to Visit  Medication Sig Dispense Refill  . allopurinol (ZYLOPRIM) 100 MG tablet Take 1 tablet (100 mg total) by mouth 2 (two) times daily. 60 tablet 5  . diclofenac (VOLTAREN) 75 MG EC tablet Take 1 tablet (75 mg total) by mouth 2 (two) times daily as needed. 60 tablet 1  . predniSONE (DELTASONE) 20 MG tablet Take 3 tablets (60 mg total) by mouth daily with breakfast. 21 tablet 0  . traMADol (ULTRAM) 50 MG tablet Take 1 tablet (50 mg total) by mouth every 8 (eight) hours as needed. 30 tablet 0  . amLODipine (NORVASC) 10 MG tablet Take 1 tablet (10 mg total) by mouth daily. 90 tablet 1  . hydrALAZINE (APRESOLINE) 10 MG tablet Take 1 tablet (10 mg total) by mouth 3 (three) times daily. 90 tablet 2  . ibuprofen (ADVIL,MOTRIN) 800 MG tablet Take 1 tablet (800 mg total) by mouth every 8 (eight) hours as needed. 30 tablet 0  . isosorbide mononitrate (IMDUR) 60 MG 24 hr tablet Take 1 tablet (60 mg total) by mouth daily. 90 tablet 3  . losartan (COZAAR) 100 MG tablet Take 1 tablet (100 mg total) by mouth daily. 90 tablet 3   No facility-administered medications prior to visit.      ROS ROS  Objective:  BP (!) 144/86 (BP Location: Left Arm, Patient Position: Sitting, Cuff Size: Large)   Pulse 70   Temp 98.2 F (36.8 C) (Oral)   Wt 255 lb 12.8 oz (116 kg)   SpO2 97%   BMI 32.84 kg/m   BP/Weight 07/30/2016 07/10/2016 02/18/7865  Systolic BP 672 094 709  Diastolic BP 86 84 93  Wt. (Lbs) 255.8 - 257  BMI 32.84 - 33      Physical Exam   Assessment & Plan:   1. Hypertension, unspecified type Visit is for a nurse visit BP check. Reported blood pressure is better than previous blood  pressures but still in hypertensive range. I am now ordering Renal US be done since BP decrease is minimal considering use of four anti-hypertensives.  Pt also requested Ibuprofen refill for gout.  - Refill losartan (COZAAR) 100 MG tablet; Take 1 tablet (100 mg total) by mouth daily.  Dispense: 90 tablet; Refill: 3 - Refill isosorbide mononitrate (IMDUR) 60 MG 24 hr tablet; Take 1 tablet (60 mg total) by mouth daily.  Dispense: 90 tablet; Refill: 3 - Refill hydrALAZINE (APRESOLINE) 10 MG tablet; Take 1 tablet (10 mg total) by mouth 3 (three) times daily.  Dispense: 90 tablet; Refill: 2 - Refill amLODipine (NORVASC) 10 MG tablet; Take 1 tablet (10 mg total) by mouth daily.  Dispense: 90 tablet; Refill: 1 - VAS US RENAL ARTERY DUPLEX; Future  2. Medication refill - Refill ibuprofen (ADVIL,MOTRIN) 800 MG tablet; Take 1 tablet (800 mg total) by mouth every 8 (eight) hours as needed.  Dispense: 30 tablet; Refill: 0  3. Gout, unspecified cause, unspecified chronicity, unspecified site - Refill ibuprofen (ADVIL,MOTRIN) 800 MG tablet; Take 1 tablet (800 mg total) by mouth every 8 (eight) hours as needed.  Dispense: 30 tablet; Refill: 0   Meds ordered this encounter  Medications  . losartan (COZAAR) 100 MG tablet    Sig: Take 1 tablet (100 mg total) by mouth daily.    Dispense:  90 tablet    Refill:  3    Order Specific Question:   Supervising Provider    Answer:   Tresa Garter [id number]  . isosorbide mononitrate (IMDUR) 60 MG 24 hr tablet    Sig: Take 1 tablet (60 mg total) by mouth daily.    Dispense:  90 tablet    Refill:  3    Order Specific Question:   Supervising Provider    Answer:   Tresa Garter [id number]  . hydrALAZINE (APRESOLINE) 10 MG tablet    Sig: Take 1 tablet (10 mg total) by mouth 3 (three) times daily.    Dispense:  90 tablet    Refill:  2    Order Specific Question:   Supervising Provider    Answer:   Tresa Garter [id number]  . amLODipine (NORVASC)  10 MG tablet    Sig: Take 1 tablet (10 mg total) by mouth daily.    Dispense:  90 tablet    Refill:  1    Order Specific Question:   Supervising Provider    Answer:   Tresa Garter [id number]  . ibuprofen (ADVIL,MOTRIN) 800 MG tablet    Sig: Take 1 tablet (800 mg total) by mouth every 8 (eight) hours as needed.    Dispense:  30 tablet    Refill:  0    Order Specific Question:   Supervising Provider    Answer:   Tresa Garter [id number]    Follow-up: No Follow-up on file.   Clent Demark PA

## 2016-07-30 NOTE — Telephone Encounter (Signed)
Patient called stated [facility] called him to pick up 5 medications,  Patient stated was not told he was being prescribed any medication today.  And he goes to [facility] on [address].  Please follow up with patient.

## 2016-07-30 NOTE — Telephone Encounter (Signed)
Roger, would you please send these Rx to [facility], I have corrected the patients pharmacy in epic and removed the other one. Nat Christen, CMA

## 2016-08-01 NOTE — Telephone Encounter (Signed)
Those were just refill orders for him to have enough for future use. He can call and request a refill of all his medications to be sent to [facility] when he is ready for them.

## 2016-08-02 NOTE — Telephone Encounter (Signed)
Patient informed. Wayne [name], CMA  

## 2016-08-05 ENCOUNTER — Other Ambulatory Visit (INDEPENDENT_AMBULATORY_CARE_PROVIDER_SITE_OTHER): Payer: Self-pay | Admitting: Physician Assistant

## 2016-08-05 DIAGNOSIS — M109 Gout, unspecified: Secondary | ICD-10-CM

## 2016-08-06 NOTE — Telephone Encounter (Signed)
FWD to PCP. Avarey Yaeger S Graceson Nichelson, CMA  

## 2016-08-07 ENCOUNTER — Ambulatory Visit (HOSPITAL_COMMUNITY)
Admission: RE | Admit: 2016-08-07 | Discharge: 2016-08-07 | Disposition: A | Discharge status: Home / Self Care | Payer: Self-pay | Source: Ambulatory Visit | Attending: Physician Assistant | Admitting: Physician Assistant

## 2016-08-07 DIAGNOSIS — I10 Essential (primary) hypertension: Secondary | ICD-10-CM | POA: Insufficient documentation

## 2016-08-07 DIAGNOSIS — I774 Celiac artery compression syndrome: Secondary | ICD-10-CM | POA: Insufficient documentation

## 2016-08-07 NOTE — Progress Notes (Signed)
*  PRELIMINARY RESULTS* Vascular Ultrasound Renal Artery Duplex has been completed.  Preliminary findings: Right renal artery does not demonstrate significant stenosis on this exam. Left Renal Aortic ratio is 269/119= 2.3.  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  Unable to confirm findings from midline due to bowel and gas.  Left renal artery appears mildly tortuous. 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  Velocity could be over estimated due to poor Doppler angle.  Everrett Coombe 08/07/2016, 12:03 PM

## 2016-08-09 ENCOUNTER — Other Ambulatory Visit (INDEPENDENT_AMBULATORY_CARE_PROVIDER_SITE_OTHER): Payer: Self-pay | Admitting: Physician Assistant

## 2016-08-09 DIAGNOSIS — I774 Celiac artery compression syndrome: Secondary | ICD-10-CM

## 2016-08-09 DIAGNOSIS — I771 Stricture of artery: Secondary | ICD-10-CM

## 2016-08-22 ENCOUNTER — Telehealth (INDEPENDENT_AMBULATORY_CARE_PROVIDER_SITE_OTHER): Payer: Self-pay | Admitting: Physician Assistant

## 2016-08-31 ENCOUNTER — Ambulatory Visit (INDEPENDENT_AMBULATORY_CARE_PROVIDER_SITE_OTHER): Payer: Self-pay

## 2016-09-04 ENCOUNTER — Encounter: Payer: Self-pay | Admitting: Vascular Surgery

## 2016-09-10 ENCOUNTER — Ambulatory Visit: Payer: Self-pay | Attending: Physician Assistant

## 2016-09-13 ENCOUNTER — Encounter: Payer: Self-pay | Admitting: Vascular Surgery

## 2016-11-14 ENCOUNTER — Other Ambulatory Visit (INDEPENDENT_AMBULATORY_CARE_PROVIDER_SITE_OTHER): Payer: Self-pay | Admitting: Physician Assistant

## 2016-11-14 DIAGNOSIS — M109 Gout, unspecified: Secondary | ICD-10-CM

## 2016-11-15 NOTE — Telephone Encounter (Signed)
FWD to PCP. Tempestt S Roberts, CMA  

## 2016-12-04 ENCOUNTER — Telehealth (INDEPENDENT_AMBULATORY_CARE_PROVIDER_SITE_OTHER): Payer: Self-pay | Admitting: Physician Assistant

## 2016-12-04 ENCOUNTER — Other Ambulatory Visit (INDEPENDENT_AMBULATORY_CARE_PROVIDER_SITE_OTHER): Payer: Self-pay | Admitting: Physician Assistant

## 2016-12-04 MED ORDER — HYDRALAZINE HCL 25 MG PO TABS: 25.0000 mg | ORAL_TABLET | Freq: Three times a day (TID) | ORAL | 3 refills | Status: DC

## 2016-12-04 NOTE — Telephone Encounter (Signed)
Patient may increase his hydralazine for now. I will send a new prescription for a higher dose of hydralazine. New dose is 25 mg four times a day. I sent to [facility] on [address].

## 2016-12-04 NOTE — Telephone Encounter (Signed)
Patient called saying that his medication losartan (COZAAR) 100 MG tablet  Is on recall  Please, call him   Thank you

## 2016-12-04 NOTE — Telephone Encounter (Signed)
Patient states losartan has a recall and is requesting an alternative. Please advise.

## 2017-01-02 ENCOUNTER — Other Ambulatory Visit (INDEPENDENT_AMBULATORY_CARE_PROVIDER_SITE_OTHER): Payer: Self-pay | Admitting: Physician Assistant

## 2017-01-02 DIAGNOSIS — Z760 Encounter for issue of repeat prescription: Secondary | ICD-10-CM

## 2017-01-02 DIAGNOSIS — M109 Gout, unspecified: Secondary | ICD-10-CM

## 2017-01-03 NOTE — Telephone Encounter (Signed)
FWD to PCP. Tempestt S Roberts, CMA  

## 2017-02-08 ENCOUNTER — Other Ambulatory Visit (INDEPENDENT_AMBULATORY_CARE_PROVIDER_SITE_OTHER): Payer: Self-pay | Admitting: Physician Assistant

## 2017-02-08 DIAGNOSIS — Z760 Encounter for issue of repeat prescription: Secondary | ICD-10-CM

## 2017-02-08 DIAGNOSIS — M109 Gout, unspecified: Secondary | ICD-10-CM

## 2017-02-11 NOTE — Telephone Encounter (Signed)
FWD to PCP. Matei Magnone S Marlyne Totaro, CMA  

## 2017-05-01 ENCOUNTER — Other Ambulatory Visit (INDEPENDENT_AMBULATORY_CARE_PROVIDER_SITE_OTHER): Payer: Self-pay | Admitting: Physician Assistant

## 2017-05-01 DIAGNOSIS — Z760 Encounter for issue of repeat prescription: Secondary | ICD-10-CM

## 2017-05-01 DIAGNOSIS — M109 Gout, unspecified: Secondary | ICD-10-CM

## 2017-05-02 NOTE — Telephone Encounter (Signed)
FWD to PCP. Tempestt S Roberts, CMA  

## 2017-06-27 ENCOUNTER — Ambulatory Visit (INDEPENDENT_AMBULATORY_CARE_PROVIDER_SITE_OTHER): Payer: Self-pay

## 2017-06-27 ENCOUNTER — Ambulatory Visit (INDEPENDENT_AMBULATORY_CARE_PROVIDER_SITE_OTHER): Payer: Self-pay | Admitting: Orthopaedic Surgery

## 2017-06-27 DIAGNOSIS — M1711 Unilateral primary osteoarthritis, right knee: Secondary | ICD-10-CM

## 2017-06-27 DIAGNOSIS — M1712 Unilateral primary osteoarthritis, left knee: Secondary | ICD-10-CM

## 2017-06-27 MED ORDER — BUPIVACAINE HCL 0.5 % IJ SOLN
2.0000 mL | INTRAMUSCULAR | Status: AC | PRN
Start: 2017-06-27 — End: 2017-06-27
  Administered 2017-06-27: 2 mL via INTRA_ARTICULAR

## 2017-06-27 MED ORDER — BUPIVACAINE HCL 0.5 % IJ SOLN
2.0000 mL | INTRAMUSCULAR | Status: AC | PRN
  Administered 2017-06-27: 2 mL via INTRA_ARTICULAR

## 2017-06-27 MED ORDER — METHYLPREDNISOLONE ACETATE 40 MG/ML IJ SUSP
40.0000 mg | INTRAMUSCULAR | Status: AC | PRN
  Administered 2017-06-27: 40 mg via INTRA_ARTICULAR

## 2017-06-27 MED ORDER — LIDOCAINE HCL 1 % IJ SOLN
2.0000 mL | INTRAMUSCULAR | Status: AC | PRN
  Administered 2017-06-27: 2 mL

## 2017-06-27 NOTE — Progress Notes (Signed)
Office Visit Note   Patient: Wayne [name]           Date of Birth: [birth year]           MRN: [id number] Visit Date: 06/27/2017              Requested by: Clent Demark, PA-C [address], [place] [address] PCP: Clent Demark, PA-C   Assessment & Plan: Visit Diagnoses:  1. Unilateral primary osteoarthritis, left knee   2. Unilateral primary osteoarthritis, right knee     Plan: Impression is bilateral knees advanced degenerative joint disease worse on the left.  Bilateral knees were injected with cortisone.  I was able aspirated about 20 cc of fluid from the left knee.  We briefly discussed total knee replacement surgery.  Follow-up with me as needed.  Follow-Up Instructions: Return if symptoms worsen or fail to improve.   Orders:  Orders Placed This Encounter  Procedures  . XR Knee Complete 4 Views Left  . XR Knee Complete 4 Views Right   No orders of the defined types were placed in this encounter.     Procedures: Large Joint Inj: bilateral knee on 06/27/2017 10:36 AM Indications: pain Details: 22 G needle  Arthrogram: No  Medications (Right): 2 mL lidocaine 1 %; 2 mL bupivacaine 0.5 %; 40 mg methylPREDNISolone acetate 40 MG/ML Medications (Left): 2 mL lidocaine 1 %; 2 mL bupivacaine 0.5 %; 40 mg methylPREDNISolone acetate 40 MG/ML Aspirate (Left): 20 mL blood-tinged; sent for lab analysis Outcome: tolerated well, no immediate complications Patient was prepped and draped in the usual sterile fashion.       Clinical Data: No additional findings.   Subjective: Chief Complaint  Patient presents with  . Right Knee - Pain  . Left Knee - Pain    Patient is a 57 year old gentleman who follows up today for bilateral knee pain worse on the left.  Saw him about a year ago for the same issue.  He has significant chronic pain that is worse with activity.   Review of Systems  Constitutional: Negative.   All other systems reviewed and are  negative.    Objective: Vital Signs: There were no vitals taken for this visit.  Physical Exam  Constitutional: He is oriented to person, place, and time. He appears well-developed and well-nourished.  Pulmonary/Chest: Effort normal.  Abdominal: Soft.  Neurological: He is alert and oriented to person, place, and time.  Skin: Skin is warm.  Psychiatric: He has a normal mood and affect. His behavior is normal. Judgment and thought content normal.  Nursing note and vitals reviewed.   Ortho Exam Mild knee exam shows a small joint effusion on the left knee.  Positive patellofemoral crepitus.  Collaterals and cruciates are stable. Specialty Comments:  No specialty comments available.  Imaging: Xr Knee Complete 4 Views Left  Result Date: 06/27/2017 Advanced degenerative joint disease  Xr Knee Complete 4 Views Right  Result Date: 06/27/2017 Advanced degenerative joint disease    PMFS History: Patient Active Problem List   Diagnosis Date Noted  . Unilateral primary osteoarthritis, left knee 05/25/2016  . Unilateral primary osteoarthritis, right knee 05/25/2016   Past Medical History:  Diagnosis Date  . High cholesterol   . Hypertension     No family history on file.  Past Surgical History:  Procedure Laterality Date  . KNEE ARTHROSCOPY    . LUMBAR DISC SURGERY     Social History   Occupational History  .  Not on file  Tobacco Use  . Smoking status: Current Every Day Smoker    Packs/day: 1.00    Types: Cigarettes  . Smokeless tobacco: Never Used  Substance and Sexual Activity  . Alcohol use: Yes    Alcohol/week: 8.4 oz    Types: 14 Cans of beer per week  . Drug use: No  . Sexual activity: Not on file

## 2017-06-27 NOTE — Addendum Note (Signed)
Addended by: Precious Bard on: 06/27/2017 11:20 AM   Modules accepted: Orders

## 2017-06-28 LAB — SYNOVIAL CELL COUNT + DIFF, W/ CRYSTALS
Basophils, %: 0 %
Eosinophils-Synovial: 0 % (ref 0–2)
LYMPHOCYTES-SYNOVIAL FLD: 46 % (ref 0–74)
Monocyte/Macrophage: 36 % (ref 0–69)
Neutrophil, Synovial: 17 % (ref 0–24)
Synoviocytes, %: 1 % (ref 0–15)
WBC, SYNOVIAL: 1170 {cells}/uL — AB (ref <150)

## 2017-06-28 LAB — TIQ-NTM

## 2017-06-28 NOTE — Progress Notes (Signed)
Gout attack

## 2017-07-11 ENCOUNTER — Other Ambulatory Visit: Payer: Self-pay

## 2017-07-11 ENCOUNTER — Ambulatory Visit (INDEPENDENT_AMBULATORY_CARE_PROVIDER_SITE_OTHER): Payer: Self-pay | Admitting: Physician Assistant

## 2017-07-11 ENCOUNTER — Encounter (INDEPENDENT_AMBULATORY_CARE_PROVIDER_SITE_OTHER): Payer: Self-pay | Admitting: Physician Assistant

## 2017-07-11 VITALS — BP 168/94 | HR 67 | Temp 98.3°F | Ht 74.0 in | Wt 246.6 lb

## 2017-07-11 DIAGNOSIS — Z760 Encounter for issue of repeat prescription: Secondary | ICD-10-CM

## 2017-07-11 DIAGNOSIS — I10 Essential (primary) hypertension: Secondary | ICD-10-CM

## 2017-07-11 DIAGNOSIS — Z1159 Encounter for screening for other viral diseases: Secondary | ICD-10-CM

## 2017-07-11 MED ORDER — AMLODIPINE BESYLATE 10 MG PO TABS: 10.0000 mg | ORAL_TABLET | Freq: Every day | ORAL | 1 refills | Status: DC

## 2017-07-11 MED ORDER — ASPIRIN EC 81 MG PO TBEC: 81.0000 mg | DELAYED_RELEASE_TABLET | Freq: Every day | ORAL | 3 refills | Status: DC

## 2017-07-11 MED ORDER — ALLOPURINOL 100 MG PO TABS: 100.0000 mg | ORAL_TABLET | Freq: Two times a day (BID) | ORAL | 5 refills | Status: DC

## 2017-07-11 MED ORDER — IBUPROFEN 800 MG PO TABS: ORAL_TABLET | ORAL | 0 refills | Status: DC

## 2017-07-11 MED ORDER — HYDROCHLOROTHIAZIDE 25 MG PO TABS: 25.0000 mg | ORAL_TABLET | Freq: Every day | ORAL | 3 refills | Status: DC

## 2017-07-11 NOTE — Patient Instructions (Signed)
Managing Your Hypertension Hypertension is commonly called high blood pressure. This is when the force of your blood pressing against the walls of your arteries is too strong. Arteries are blood vessels that carry blood from your heart throughout your body. Hypertension forces the heart to work harder to pump blood, and may cause the arteries to become narrow or stiff. Having untreated or uncontrolled hypertension can cause heart attack, stroke, kidney disease, and other problems. What are blood pressure readings? A blood pressure reading consists of a higher number over a lower number. Ideally, your blood pressure should be below 120/80. The first ("top") number is called the systolic pressure. It is a measure of the pressure in your arteries as your heart beats. The second ("bottom") number is called the diastolic pressure. It is a measure of the pressure in your arteries as the heart relaxes. What does my blood pressure reading mean? Blood pressure is classified into four stages. Based on your blood pressure reading, your health care provider may use the following stages to determine what type of treatment you need, if any. Systolic pressure and diastolic pressure are measured in a unit called mm Hg. Normal  Systolic pressure: below 120.  Diastolic pressure: below 80. Elevated  Systolic pressure: 120-129.  Diastolic pressure: below 80. Hypertension stage 1  Systolic pressure: 130-139.  Diastolic pressure: 80-89. Hypertension stage 2  Systolic pressure: 140 or above.  Diastolic pressure: 90 or above. What health risks are associated with hypertension? Managing your hypertension is an important responsibility. Uncontrolled hypertension can lead to:  A heart attack.  A stroke.  A weakened blood vessel (aneurysm).  Heart failure.  Kidney damage.  Eye damage.  Metabolic syndrome.  Memory and concentration problems.  What changes can I make to manage my  hypertension? Hypertension can be managed by making lifestyle changes and possibly by taking medicines. Your health care provider will help you make a plan to bring your blood pressure within a normal range. Eating and drinking  Eat a diet that is high in fiber and potassium, and low in salt (sodium), added sugar, and fat. An example eating plan is called the DASH (Dietary Approaches to Stop Hypertension) diet. To eat this way: ? Eat plenty of fresh fruits and vegetables. Try to fill half of your plate at each meal with fruits and vegetables. ? Eat whole grains, such as whole wheat pasta, brown rice, or whole grain bread. Fill about one quarter of your plate with whole grains. ? Eat low-fat diary products. ? Avoid fatty cuts of meat, processed or cured meats, and poultry with skin. Fill about one quarter of your plate with lean proteins such as fish, chicken without skin, beans, eggs, and tofu. ? Avoid premade and processed foods. These tend to be higher in sodium, added sugar, and fat.  Reduce your daily sodium intake. Most people with hypertension should eat less than 1,500 mg of sodium a day.  Limit alcohol intake to no more than 1 drink a day for nonpregnant women and 2 drinks a day for men. One drink equals 12 oz of beer, 5 oz of wine, or 1 oz of hard liquor. Lifestyle  Work with your health care provider to maintain a healthy body weight, or to lose weight. Ask what an ideal weight is for you.  Get at least 30 minutes of exercise that causes your heart to beat faster (aerobic exercise) most days of the week. Activities may include walking, swimming, or biking.  Include exercise   to strengthen your muscles (resistance exercise), such as weight lifting, as part of your weekly exercise routine. Try to do these types of exercises for 30 minutes at least 3 days a week.  Do not use any products that contain nicotine or tobacco, such as cigarettes and e-cigarettes. If you need help quitting, ask  your health care provider.  Control any long-term (chronic) conditions you have, such as high cholesterol or diabetes. Monitoring  Monitor your blood pressure at home as told by your health care provider. Your personal target blood pressure may vary depending on your medical conditions, your age, and other factors.  Have your blood pressure checked regularly, as often as told by your health care provider. Working with your health care provider  Review all the medicines you take with your health care provider because there may be side effects or interactions.  Talk with your health care provider about your diet, exercise habits, and other lifestyle factors that may be contributing to hypertension.  Visit your health care provider regularly. Your health care provider can help you create and adjust your plan for managing hypertension. Will I need medicine to control my blood pressure? Your health care provider may prescribe medicine if lifestyle changes are not enough to get your blood pressure under control, and if:  Your systolic blood pressure is 130 or higher.  Your diastolic blood pressure is 80 or higher.  Take medicines only as told by your health care provider. Follow the directions carefully. Blood pressure medicines must be taken as prescribed. The medicine does not work as well when you skip doses. Skipping doses also puts you at risk for problems. Contact a health care provider if:  You think you are having a reaction to medicines you have taken.  You have repeated (recurrent) headaches.  You feel dizzy.  You have swelling in your ankles.  You have trouble with your vision. Get help right away if:  You develop a severe headache or confusion.  You have unusual weakness or numbness, or you feel faint.  You have severe pain in your chest or abdomen.  You vomit repeatedly.  You have trouble breathing. Summary  Hypertension is when the force of blood pumping through  your arteries is too strong. If this condition is not controlled, it may put you at risk for serious complications.  Your personal target blood pressure may vary depending on your medical conditions, your age, and other factors. For most people, a normal blood pressure is less than 120/80.  Hypertension is managed by lifestyle changes, medicines, or both. Lifestyle changes include weight loss, eating a healthy, low-sodium diet, exercising more, and limiting alcohol. This information is not intended to replace advice given to you by your health care provider. Make sure you discuss any questions you have with your health care provider. Document Released: 10/03/2011 Document Revised: 12/07/2015 Document Reviewed: 12/07/2015 Elsevier Interactive Patient Education  2018 Elsevier Inc.  

## 2017-07-11 NOTE — Progress Notes (Signed)
Subjective:  Patient ID: Wayne [name], male    DOB: October 02, 1960  Age: 57 y.o. MRN: [id number]  CC: HTN  HPI  Wayne [name] is a 57 y.o. male with a PMH of HTN, HLD, and left knee pain presents to f/u on HTN. Last visit here nearly one year ago. BP 144/86 mmHg last year and 168/94 mmHg today. Has run out of medications. Needs refills but says one of his medications causes headaches. Does not know which one. Does not endorse CP, palpitations, SOB, HA, abdominal pain, f/c/n/v, rash, swelling, presyncope, syncope, PND, orthopnea, or GI/GU sxs.   Brings a letter from [facility] requesting a statement of patient's diagnosis, symptoms, and limitations. Currently being treated by orthopedic specialist for unilateral primary osteoarthritis of the left and right knee.      Outpatient Medications Prior to Visit  Medication Sig Dispense Refill  . isosorbide mononitrate (IMDUR) 60 MG 24 hr tablet Take 1 tablet (60 mg total) by mouth daily. 90 tablet 3  . allopurinol (ZYLOPRIM) 100 MG tablet Take 1 tablet (100 mg total) by mouth 2 (two) times daily. (Patient not taking: Reported on 07/11/2017) 60 tablet 5  . amLODipine (NORVASC) 10 MG tablet Take 1 tablet (10 mg total) by mouth daily. 90 tablet 1  . hydrALAZINE (APRESOLINE) 10 MG tablet   2  . hydrALAZINE (APRESOLINE) 25 MG tablet Take 1 tablet (25 mg total) 3 (three) times daily by mouth. (Patient not taking: Reported on 07/11/2017) 120 tablet 3  . ibuprofen (ADVIL,MOTRIN) 800 MG tablet TAKE 1 TABLET(800 MG) BY MOUTH EVERY 8 HOURS AS NEEDED (Patient not taking: Reported on 06/27/2017) 30 tablet 0  . diclofenac (VOLTAREN) 75 MG EC tablet Take 1 tablet (75 mg total) by mouth 2 (two) times daily as needed. (Patient not taking: Reported on 06/27/2017) 60 tablet 1  . predniSONE (DELTASONE) 20 MG tablet Take 3 tablets (60 mg total) by mouth daily with breakfast. (Patient not taking: Reported on 06/27/2017) 21 tablet 0  . traMADol (ULTRAM) 50 MG tablet  Take 1 tablet (50 mg total) by mouth every 8 (eight) hours as needed. (Patient not taking: Reported on 06/27/2017) 30 tablet 0   No facility-administered medications prior to visit.      ROS Review of Systems  Constitutional: Negative for chills, fever and malaise/fatigue.  Eyes: Negative for blurred vision.  Respiratory: Negative for shortness of breath.   Cardiovascular: Negative for chest pain and palpitations.  Gastrointestinal: Negative for abdominal pain and nausea.  Genitourinary: Negative for dysuria and hematuria.  Musculoskeletal: Positive for joint pain. Negative for myalgias.  Skin: Negative for rash.  Neurological: Negative for tingling and headaches.  Psychiatric/Behavioral: Negative for depression. The patient is not nervous/anxious.     Objective:  BP (!) 168/94 (BP Location: Left Arm, Patient Position: Sitting, Cuff Size: Large)   Pulse 67   Temp 98.3 F (36.8 C) (Oral)   Ht 6\' 2"  (1.88 m)   Wt 246 lb 9.6 oz (111.9 kg)   SpO2 95%   BMI 31.66 kg/m   BP/Weight 07/11/2017 07/30/2016 4/58/0998  Systolic BP 338 250 539  Diastolic BP 94 86 84  Wt. (Lbs) 246.6 255.8 -  BMI 31.66 32.84 -      Physical Exam  Constitutional: He is oriented to person, place, and time.  Well developed, well nourished, NAD, polite  HENT:  Head: Normocephalic and atraumatic.  Eyes: No scleral icterus.  Neck: Normal range of motion. Neck supple. No thyromegaly  present.  Cardiovascular: Normal rate, regular rhythm and normal heart sounds.  Pulmonary/Chest: Effort normal and breath sounds normal.  Abdominal: Soft. Bowel sounds are normal. There is no tenderness.  Musculoskeletal:  Bilateral knee tenderness to palpation. Limited aROM of the knees 2/2 pain. Bony hypertrophy of the knees.   Neurological: He is alert and oriented to person, place, and time.  Antalgic gait  Skin: Skin is warm and dry. No rash noted. No erythema. No pallor.  Psychiatric: He has a normal mood and affect.  His behavior is normal. Thought content normal.  Vitals reviewed.    Assessment & Plan:    1. Hypertension, unspecified type - Refill amLODipine (NORVASC) 10 MG tablet; Take 1 tablet (10 mg total) by mouth daily.  Dispense: 90 tablet; Refill: 1 - Begin hydrochlorothiazide (HYDRODIURIL) 25 MG tablet; Take 1 tablet (25 mg total) by mouth daily.  Dispense: 90 tablet; Refill: 3 - Comprehensive metabolic panel - CBC with Differential - Lipid panel; Future   2. Medication refill - ibuprofen (ADVIL,MOTRIN) 800 MG tablet; TAKE 1 TABLET(800 MG) BY MOUTH EVERY 8 HOURS AS NEEDED  Dispense: 30 tablet; Refill: 0 - allopurinol (ZYLOPRIM) 100 MG tablet; Take 1 tablet (100 mg total) by mouth 2 (two) times daily.  Dispense: 60 tablet; Refill: 5  3. Need for hepatitis C screening test - Hepatitis c antibody (reflex)   Meds ordered this encounter  Medications  . amLODipine (NORVASC) 10 MG tablet    Sig: Take 1 tablet (10 mg total) by mouth daily.    Dispense:  90 tablet    Refill:  1    Order Specific Question:   Supervising Provider    Answer:   Charlott Rakes [[id number]]  . ibuprofen (ADVIL,MOTRIN) 800 MG tablet    Sig: TAKE 1 TABLET(800 MG) BY MOUTH EVERY 8 HOURS AS NEEDED    Dispense:  30 tablet    Refill:  0    Order Specific Question:   Supervising Provider    Answer:   Charlott Rakes [[id number]]  . allopurinol (ZYLOPRIM) 100 MG tablet    Sig: Take 1 tablet (100 mg total) by mouth 2 (two) times daily.    Dispense:  60 tablet    Refill:  5    Order Specific Question:   Supervising Provider    Answer:   Charlott Rakes [[id number]]  . hydrochlorothiazide (HYDRODIURIL) 25 MG tablet    Sig: Take 1 tablet (25 mg total) by mouth daily.    Dispense:  90 tablet    Refill:  3    Order Specific Question:   Supervising Provider    Answer:   Charlott Rakes [[id number]]  . aspirin EC 81 MG tablet    Sig: Take 1 tablet (81 mg total) by mouth daily.    Dispense:  90 tablet    Refill:  3    Order Specific  Question:   Supervising Provider    Answer:   Charlott Rakes [[id number]]    Follow-up: Return in about 1 month (around 08/08/2017) for HTN.   Clent Demark PA

## 2017-07-12 ENCOUNTER — Telehealth (INDEPENDENT_AMBULATORY_CARE_PROVIDER_SITE_OTHER): Payer: Self-pay | Admitting: Orthopaedic Surgery

## 2017-07-12 ENCOUNTER — Other Ambulatory Visit (INDEPENDENT_AMBULATORY_CARE_PROVIDER_SITE_OTHER): Payer: Self-pay | Admitting: Physician Assistant

## 2017-07-12 DIAGNOSIS — Z760 Encounter for issue of repeat prescription: Secondary | ICD-10-CM

## 2017-07-12 LAB — COMPREHENSIVE METABOLIC PANEL
ALBUMIN: 4.4 g/dL (ref 3.5–5.5)
ALT: 16 IU/L (ref 0–44)
AST: 12 IU/L (ref 0–40)
Albumin/Globulin Ratio: 1.4 (ref 1.2–2.2)
Alkaline Phosphatase: 86 IU/L (ref 39–117)
BUN / CREAT RATIO: 13 (ref 9–20)
BUN: 13 mg/dL (ref 6–24)
Bilirubin Total: 0.3 mg/dL (ref 0.0–1.2)
CALCIUM: 9.6 mg/dL (ref 8.7–10.2)
CO2: 25 mmol/L (ref 20–29)
CREATININE: 1.02 mg/dL (ref 0.76–1.27)
Chloride: 102 mmol/L (ref 96–106)
GFR calc Af Amer: 95 mL/min/{1.73_m2} (ref >59)
GFR, EST NON AFRICAN AMERICAN: 82 mL/min/{1.73_m2} (ref >59)
GLOBULIN, TOTAL: 3.1 g/dL (ref 1.5–4.5)
GLUCOSE: 104 mg/dL — AB (ref 65–99)
Potassium: 4.9 mmol/L (ref 3.5–5.2)
SODIUM: 142 mmol/L (ref 134–144)
Total Protein: 7.5 g/dL (ref 6.0–8.5)

## 2017-07-12 LAB — CBC WITH DIFFERENTIAL/PLATELET
BASOS: 1 %
Basophils Absolute: 0.1 10*3/uL (ref 0.0–0.2)
EOS (ABSOLUTE): 0.2 10*3/uL (ref 0.0–0.4)
EOS: 2 %
HEMOGLOBIN: 14.4 g/dL (ref 13.0–17.7)
Hematocrit: 42.4 % (ref 37.5–51.0)
IMMATURE GRANULOCYTES: 0 %
Immature Grans (Abs): 0 10*3/uL (ref 0.0–0.1)
LYMPHS ABS: 3.8 10*3/uL — AB (ref 0.7–3.1)
Lymphs: 40 %
MCH: 30.4 pg (ref 26.6–33.0)
MCHC: 34 g/dL (ref 31.5–35.7)
MCV: 90 fL (ref 79–97)
MONOCYTES: 8 %
MONOS ABS: 0.8 10*3/uL (ref 0.1–0.9)
NEUTROS PCT: 49 %
Neutrophils Absolute: 4.8 10*3/uL (ref 1.4–7.0)
Platelets: 337 10*3/uL (ref 150–450)
RBC: 4.73 x10E6/uL (ref 4.14–5.80)
RDW: 15.3 % (ref 12.3–15.4)
WBC: 9.6 10*3/uL (ref 3.4–10.8)

## 2017-07-12 LAB — HEPATITIS C ANTIBODY (REFLEX): HCV Ab: <0.1 s/co ratio (ref 0.0–0.9)

## 2017-07-12 LAB — HCV COMMENT:

## 2017-07-12 NOTE — Telephone Encounter (Signed)
Fu with dr. Erlinda Hong to discuss total knee replacement as that is the next step

## 2017-07-12 NOTE — Telephone Encounter (Signed)
IC patient to advise. No answer. LMVM for him advising to call office to schedule appt with Dr Erlinda Hong

## 2017-07-12 NOTE — Telephone Encounter (Signed)
Please advise 

## 2017-07-12 NOTE — Telephone Encounter (Signed)
Patient received injections in both knees and its been a little over 2 weeks and he said they didn't help as he is still in the same pain he was before. What is the next step for him? # [phone]

## 2017-07-17 ENCOUNTER — Ambulatory Visit (INDEPENDENT_AMBULATORY_CARE_PROVIDER_SITE_OTHER): Payer: Self-pay | Admitting: Orthopaedic Surgery

## 2017-07-17 DIAGNOSIS — M1712 Unilateral primary osteoarthritis, left knee: Secondary | ICD-10-CM

## 2017-07-17 NOTE — Progress Notes (Signed)
   Office Visit Note   Patient: Wayne [name]           Date of Birth: 09/30/1960           MRN: [id number] Visit Date: 07/17/2017              Requested by: Wayne Demark, PA-C [place],  [id number] PCP: Wayne Demark, PA-C   Assessment & Plan: Visit Diagnoses:  1. Unilateral primary osteoarthritis, left knee     Plan: Impression 57 year old gentleman with end-stage left knee degenerative joint disease.  We again discussed conservative versus surgical treatment and the associated risks and benefits.  I stated that I am happy to assign him an impairment rating after a total knee replacement and hopefully this will help him with his disability process.  He has had temporary relief from previous cortisone injections.  He will let us know when he is ready to schedule knee replacement.  Follow-Up Instructions: Return if symptoms worsen or fail to improve.   Orders:  No orders of the defined types were placed in this encounter.  No orders of the defined types were placed in this encounter.     Procedures: No procedures performed   Clinical Data: No additional findings.   Subjective: Chief Complaint  Patient presents with  . Left Knee - Follow-up    Discuss possible knee replacement    Wayne [name] is a 56 from his left knee degenerative joint disease.  He is interested in exploring treatment options.  He is leaning towards a knee replacement.   Review of Systems   Objective: Vital Signs: There were no vitals taken for this visit.  Physical Exam  Ortho Exam Left knee exam is stable. Specialty Comments:  No specialty comments available.  Imaging: No results found.   PMFS History: Patient Active Problem List   Diagnosis Date Noted  . Unilateral primary osteoarthritis, left knee 05/25/2016  . Unilateral primary osteoarthritis, right knee 05/25/2016   Past Medical History:  Diagnosis Date  . High cholesterol   . Hypertension       No family history on file.  Past Surgical History:  Procedure Laterality Date  . KNEE ARTHROSCOPY    . LUMBAR DISC SURGERY     Social History   Occupational History  . Not on file  Tobacco Use  . Smoking status: Current Every Day Smoker    Packs/day: 1.00    Types: Cigarettes  . Smokeless tobacco: Never Used  Substance and Sexual Activity  . Alcohol use: Yes    Alcohol/week: 8.4 oz    Types: 14 Cans of beer per week  . Drug use: No  . Sexual activity: Not on file

## 2017-07-18 ENCOUNTER — Telehealth (INDEPENDENT_AMBULATORY_CARE_PROVIDER_SITE_OTHER): Payer: Self-pay

## 2017-07-18 NOTE — Telephone Encounter (Signed)
Patient aware of negative HCV and all other normal labs. Nat Christen, CMA

## 2017-07-18 NOTE — Telephone Encounter (Signed)
-----   Message from Clent Demark, PA-C sent at 07/15/2017  5:32 PM EDT ----- HCV negative. Rest of labs unremarkable.

## 2017-07-22 ENCOUNTER — Ambulatory Visit (INDEPENDENT_AMBULATORY_CARE_PROVIDER_SITE_OTHER): Payer: Self-pay | Admitting: Physician Assistant

## 2017-08-12 ENCOUNTER — Ambulatory Visit (INDEPENDENT_AMBULATORY_CARE_PROVIDER_SITE_OTHER): Payer: Self-pay | Admitting: Physician Assistant

## 2017-08-12 ENCOUNTER — Encounter (INDEPENDENT_AMBULATORY_CARE_PROVIDER_SITE_OTHER): Payer: Self-pay | Admitting: Physician Assistant

## 2017-08-12 VITALS — BP 127/94 | HR 87 | Temp 98.1°F | Ht 74.0 in | Wt 239.8 lb

## 2017-08-12 DIAGNOSIS — I10 Essential (primary) hypertension: Secondary | ICD-10-CM | POA: Insufficient documentation

## 2017-08-12 DIAGNOSIS — M10349 Gout due to renal impairment, unspecified hand: Secondary | ICD-10-CM

## 2017-08-12 DIAGNOSIS — Z1211 Encounter for screening for malignant neoplasm of colon: Secondary | ICD-10-CM

## 2017-08-12 MED ORDER — METOPROLOL SUCCINATE ER 25 MG PO TB24: 25.0000 mg | ORAL_TABLET | Freq: Every day | ORAL | 5 refills | Status: DC

## 2017-08-12 MED ORDER — LOSARTAN POTASSIUM 50 MG PO TABS
50.0000 mg | ORAL_TABLET | Freq: Every day | ORAL | 5 refills | Status: DC
Start: 2017-08-12 — End: 2019-05-20

## 2017-08-12 NOTE — Patient Instructions (Signed)

## 2017-08-12 NOTE — Progress Notes (Signed)
Subjective:  Patient ID: Wayne [name], male    DOB: 1960-06-02  Age: 57 y.o. MRN: [id number]  CC: HTN f/u  HPI Wayne L Milleris a 57 y.o.malewith a PMH of HTN, HLD, Gout, and left knee pain presents to f/u on HTN. Last BP 168/94 mmHg one month ago. Has been taking Amlodipine 10 mg, HCTZ 25 mg, and Isosorbide 60 mg as directed. Has not been taking Hydralazine due to headaches caused by hydralazine. No further headaches after stopping hydralazine. BP 127/94 mmHg today. Endorses mildly increased finger joint attributed to gout, although, there is no finger joint pain at this moment. Left knee pain somewhat more intense. Has been trying to keep a low purine diet and is taking his allopurinol daily. Does not endorse any other symptoms or complaints.      Outpatient Medications Prior to Visit  Medication Sig Dispense Refill  . allopurinol (ZYLOPRIM) 100 MG tablet Take 1 tablet (100 mg total) by mouth 2 (two) times daily. 60 tablet 5  . amLODipine (NORVASC) 10 MG tablet Take 1 tablet (10 mg total) by mouth daily. 90 tablet 1  . aspirin EC 81 MG tablet Take 1 tablet (81 mg total) by mouth daily. 90 tablet 3  . hydrochlorothiazide (HYDRODIURIL) 25 MG tablet Take 1 tablet (25 mg total) by mouth daily. 90 tablet 3  . isosorbide mononitrate (IMDUR) 60 MG 24 hr tablet Take 1 tablet (60 mg total) by mouth daily. 90 tablet 3  . hydrALAZINE (APRESOLINE) 25 MG tablet Take 1 tablet (25 mg total) 3 (three) times daily by mouth. (Patient not taking: Reported on 07/11/2017) 120 tablet 3  . ibuprofen (ADVIL,MOTRIN) 800 MG tablet TAKE 1 TABLET(800 MG) BY MOUTH EVERY 8 HOURS AS NEEDED (Patient not taking: Reported on 08/12/2017) 30 tablet 0   No facility-administered medications prior to visit.      ROS Review of Systems  Constitutional: Negative for chills, fever and malaise/fatigue.  Eyes: Negative for blurred vision.  Respiratory: Negative for shortness of breath.   Cardiovascular: Negative for chest  pain and palpitations.  Gastrointestinal: Negative for abdominal pain and nausea.  Genitourinary: Negative for dysuria and hematuria.  Musculoskeletal: Negative for joint pain and myalgias.  Skin: Negative for rash.  Neurological: Negative for tingling and headaches.  Psychiatric/Behavioral: Negative for depression. The patient is not nervous/anxious.     Objective:  BP (!) 127/94 (BP Location: Left Arm, Patient Position: Sitting, Cuff Size: Large)   Pulse 87   Temp 98.1 F (36.7 C) (Oral)   Ht 6\' 2"  (1.88 m)   Wt 239 lb 12.8 oz (108.8 kg)   SpO2 97%   BMI 30.79 kg/m   BP/Weight 08/12/2017 2/53/6644 0/03/4740  Systolic BP 595 638 756  Diastolic BP 94 94 86  Wt. (Lbs) 239.8 246.6 255.8  BMI 30.79 31.66 32.84      Physical Exam  Constitutional: He is oriented to person, place, and time.  Well developed, well nourished, NAD, polite  HENT:  Head: Normocephalic and atraumatic.  Eyes: No scleral icterus.  Neck: Normal range of motion. Neck supple. No thyromegaly present.  Cardiovascular: Normal rate, regular rhythm and normal heart sounds.  Pulmonary/Chest: Effort normal and breath sounds normal.  Musculoskeletal: He exhibits no edema.  Left knee with bony hypertrophy and mild TTP on the medial aspect of tibial plateau.  Neurological: He is alert and oriented to person, place, and time.  Skin: Skin is warm and dry. No rash noted. No erythema. No pallor.  Psychiatric: He has a normal mood and affect. His behavior is normal. Thought content normal.  Vitals reviewed.    Assessment & Plan:   1. Essential hypertension - Lipid panel - Stop HCTZ - Begin metoprolol succinate (TOPROL-XL) 25 MG 24 hr tablet; Take 1 tablet (25 mg total) by mouth daily.  Dispense: 30 tablet; Refill: 5 - Begin Losartan 50 mg one tablet po qday, x30 days, #30 five refills  2. Special screening for malignant neoplasms, colon - Fecal occult blood, imunochemical; Future  3. 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 Gout was not in problem list when initially prescribed HCTZ. Gout added to problem list today. - Continue allopurinol   Meds ordered this encounter  Medications  . metoprolol succinate (TOPROL-XL) 25 MG 24 hr tablet    Sig: Take 1 tablet (25 mg total) by mouth daily.    Dispense:  30 tablet    Refill:  5    Order Specific Question:   Supervising Provider    Answer:   Charlott Rakes [[id number]]  . losartan (COZAAR) 50 MG tablet    Sig: Take 1 tablet (50 mg total) by mouth daily.    Dispense:  30 tablet    Refill:  5    Order Specific Question:   Supervising Provider    Answer:   Charlott Rakes [[id number]]    Follow-up: Return in about 1 month (around 09/09/2017) for HTN.   Clent Demark PA

## 2017-08-13 ENCOUNTER — Other Ambulatory Visit (INDEPENDENT_AMBULATORY_CARE_PROVIDER_SITE_OTHER): Payer: Self-pay | Admitting: Physician Assistant

## 2017-08-13 ENCOUNTER — Telehealth (INDEPENDENT_AMBULATORY_CARE_PROVIDER_SITE_OTHER): Payer: Self-pay

## 2017-08-13 DIAGNOSIS — E7841 Elevated Lipoprotein(a): Secondary | ICD-10-CM

## 2017-08-13 LAB — LIPID PANEL
CHOLESTEROL TOTAL: 206 mg/dL — AB (ref 100–199)
Chol/HDL Ratio: 4.6 ratio (ref 0.0–5.0)
HDL: 45 mg/dL (ref >39)
LDL CALC: 135 mg/dL — AB (ref 0–99)
TRIGLYCERIDES: 129 mg/dL (ref 0–149)
VLDL Cholesterol Cal: 26 mg/dL (ref 5–40)

## 2017-08-13 MED ORDER — ATORVASTATIN CALCIUM 40 MG PO TABS: 40.0000 mg | ORAL_TABLET | Freq: Every day | ORAL | 3 refills | Status: DC

## 2017-08-13 NOTE — Telephone Encounter (Signed)
-----   Message from Clent Demark, PA-C sent at 08/13/2017 11:55 AM EDT ----- LDL is elevated. I have sent atorvastatin 40 mg to [facility] on [address].

## 2017-08-13 NOTE — Telephone Encounter (Signed)
Patient aware of elevated LDL and atorvastatin 40 mg being sent to [facility] on [address]. Nat Christen, CMA

## 2017-08-16 NOTE — Addendum Note (Signed)
Addended by: Nat Christen on: 08/16/2017 03:11 PM   Modules accepted: Orders

## 2017-08-20 ENCOUNTER — Other Ambulatory Visit (INDEPENDENT_AMBULATORY_CARE_PROVIDER_SITE_OTHER): Payer: Self-pay | Admitting: Physician Assistant

## 2017-08-20 ENCOUNTER — Telehealth (INDEPENDENT_AMBULATORY_CARE_PROVIDER_SITE_OTHER): Payer: Self-pay

## 2017-08-20 DIAGNOSIS — R195 Other fecal abnormalities: Secondary | ICD-10-CM

## 2017-08-20 LAB — FECAL OCCULT BLOOD, IMMUNOCHEMICAL: Fecal Occult Bld: POSITIVE — AB

## 2017-08-20 NOTE — Telephone Encounter (Signed)
Noted  

## 2017-08-20 NOTE — Telephone Encounter (Signed)
-----   Message from Clent Demark, PA-C sent at 08/20/2017  1:07 PM EDT ----- Positive FIT. I have made referral to gastroenterology for colonoscopy. Please make sure patient has already completed CAFA or working towards [place].

## 2017-08-20 NOTE — Telephone Encounter (Signed)
Patient aware of positive FIT and referral placed to gastro for colonoscopy. Patient states he can not complete CAFA because his wife will not provide him with the documents needed( bank statements). Advised patient to complete another application and schedule appointment with financial to see if there is some type of way around needing the bank statements since he states that he and his wife do not share a bank account. Nat Christen, CMA

## 2017-09-02 ENCOUNTER — Other Ambulatory Visit (INDEPENDENT_AMBULATORY_CARE_PROVIDER_SITE_OTHER): Payer: Self-pay | Admitting: Physician Assistant

## 2017-09-02 DIAGNOSIS — I10 Essential (primary) hypertension: Secondary | ICD-10-CM

## 2017-09-02 NOTE — Telephone Encounter (Signed)
FWD to PCP. Tempestt S Roberts, CMA  

## 2017-09-05 ENCOUNTER — Other Ambulatory Visit (INDEPENDENT_AMBULATORY_CARE_PROVIDER_SITE_OTHER): Payer: Self-pay | Admitting: Physician Assistant

## 2017-09-05 ENCOUNTER — Encounter: Payer: Self-pay | Admitting: Gastroenterology

## 2017-09-05 DIAGNOSIS — I10 Essential (primary) hypertension: Secondary | ICD-10-CM

## 2017-09-05 MED ORDER — ISOSORBIDE MONONITRATE ER 60 MG PO TB24: 60.0000 mg | ORAL_TABLET | Freq: Every day | ORAL | 3 refills | Status: DC

## 2017-09-09 ENCOUNTER — Encounter (INDEPENDENT_AMBULATORY_CARE_PROVIDER_SITE_OTHER): Payer: Self-pay | Admitting: Physician Assistant

## 2017-09-09 ENCOUNTER — Ambulatory Visit (INDEPENDENT_AMBULATORY_CARE_PROVIDER_SITE_OTHER): Payer: Self-pay | Admitting: Physician Assistant

## 2017-09-09 VITALS — BP 167/90 | HR 73 | Temp 97.6°F | Ht 74.0 in | Wt 247.2 lb

## 2017-09-09 DIAGNOSIS — Z760 Encounter for issue of repeat prescription: Secondary | ICD-10-CM

## 2017-09-09 DIAGNOSIS — I10 Essential (primary) hypertension: Secondary | ICD-10-CM

## 2017-09-09 DIAGNOSIS — E7841 Elevated Lipoprotein(a): Secondary | ICD-10-CM

## 2017-09-09 MED ORDER — ATORVASTATIN CALCIUM 40 MG PO TABS: 40.0000 mg | ORAL_TABLET | Freq: Every day | ORAL | 5 refills | Status: DC

## 2017-09-09 MED ORDER — ISOSORBIDE MONONITRATE ER 60 MG PO TB24: 60.0000 mg | ORAL_TABLET | Freq: Every day | ORAL | 5 refills | Status: DC

## 2017-09-09 NOTE — Progress Notes (Signed)
Subjective:  Patient ID: Wayne [name], male    DOB: 04/25/1960  Age: 57 y.o. MRN: [id number]  CC: Hypertension   HPI Wayne L Milleris a 57 y.o.malewith a PMH of HTN,HLD, Gout, and left knee painpresents to f/u on HTN. Last BP 127/94 mmHg one month ago. BP 167/90 today. Thinks he is only taking three of the four anti-hypertensives prescribed to him. Does not know which medication or medications he may not be taking. Also has not begun atorvastatin due to lack of funds. Does not endorse CP, palpitations, SOB, HA, tingling, numbness, abdominal pain, f/c/n/v, rash, edema, or GI/GU sxs.    Outpatient Medications Prior to Visit  Medication Sig Dispense Refill  . allopurinol (ZYLOPRIM) 100 MG tablet Take 1 tablet (100 mg total) by mouth 2 (two) times daily. 60 tablet 5  . amLODipine (NORVASC) 10 MG tablet Take 1 tablet (10 mg total) by mouth daily. 90 tablet 1  . aspirin EC 81 MG tablet Take 1 tablet (81 mg total) by mouth daily. 90 tablet 3  . isosorbide mononitrate (IMDUR) 60 MG 24 hr tablet TAKE 1 TABLET BY MOUTH ONCE DAILY 90 tablet 3  . isosorbide mononitrate (IMDUR) 60 MG 24 hr tablet Take 1 tablet (60 mg total) by mouth daily. 90 tablet 3  . losartan (COZAAR) 50 MG tablet Take 1 tablet (50 mg total) by mouth daily. 30 tablet 5  . metoprolol succinate (TOPROL-XL) 25 MG 24 hr tablet Take 1 tablet (25 mg total) by mouth daily. 30 tablet 5  . atorvastatin (LIPITOR) 40 MG tablet Take 1 tablet (40 mg total) by mouth daily. (Patient not taking: Reported on 09/09/2017) 90 tablet 3   No facility-administered medications prior to visit.      ROS Review of Systems  Constitutional: Negative for chills, fever and malaise/fatigue.  Eyes: Negative for blurred vision.  Respiratory: Negative for shortness of breath.   Cardiovascular: Negative for chest pain and palpitations.  Gastrointestinal: Negative for abdominal pain and nausea.  Genitourinary: Negative for dysuria and hematuria.   Musculoskeletal: Negative for joint pain and myalgias.  Skin: Negative for rash.  Neurological: Negative for tingling and headaches.  Psychiatric/Behavioral: Negative for depression. The patient is not nervous/anxious.     Objective:  BP (!) 167/90   Pulse 73   Temp 97.6 F (36.4 C) (Oral)   Ht 6\' 2"  (1.88 m)   Wt 247 lb 3.2 oz (112.1 kg)   SpO2 97%   BMI 31.74 kg/m   BP/Weight 09/09/2017 08/12/2017 2/59/5638  Systolic BP 756 433 295  Diastolic BP 90 94 94  Wt. (Lbs) 247.2 239.8 246.6  BMI 31.74 30.79 31.66      Physical Exam  Constitutional: He is oriented to person, place, and time.  Well developed, well nourished, NAD, polite  HENT:  Head: Normocephalic and atraumatic.  Eyes: No scleral icterus.  Neck: Normal range of motion. Neck supple. No thyromegaly present.  Cardiovascular: Normal rate, regular rhythm and normal heart sounds.  No LE edema bilaterally.  Pulmonary/Chest: Effort normal and breath sounds normal.  Abdominal: Soft. Bowel sounds are normal. There is no tenderness.  Musculoskeletal: He exhibits no edema.  Neurological: He is alert and oriented to person, place, and time.  Skin: Skin is warm and dry. No rash noted. No erythema. No pallor.  Psychiatric: He has a normal mood and affect. His behavior is normal. Thought content normal.  Vitals reviewed.    Assessment & Plan:    1. Hypertension, unspecified  type - Seems patient is not taking all anti-hypertensives. I have asked he go home and reconcile his medications to the medication list I have given him. He is currently asymptomatic. Advised to return in 4 weeks for BP check with nurse. Pt in agreement. Pt asymptomatic.      Follow-up: 4 weeks for Nurse visit BP  Clent Demark PA

## 2017-09-09 NOTE — Patient Instructions (Signed)

## 2017-10-10 ENCOUNTER — Ambulatory Visit (INDEPENDENT_AMBULATORY_CARE_PROVIDER_SITE_OTHER): Payer: Self-pay | Admitting: Physician Assistant

## 2017-10-10 ENCOUNTER — Encounter (INDEPENDENT_AMBULATORY_CARE_PROVIDER_SITE_OTHER): Payer: Self-pay

## 2017-10-10 VITALS — BP 132/80 | HR 65 | Temp 97.8°F | Ht 74.0 in | Wt 250.6 lb

## 2017-10-10 DIAGNOSIS — Z8739 Personal history of other diseases of the musculoskeletal system and connective tissue: Secondary | ICD-10-CM

## 2017-10-10 DIAGNOSIS — M25562 Pain in left knee: Secondary | ICD-10-CM

## 2017-10-10 DIAGNOSIS — M25561 Pain in right knee: Secondary | ICD-10-CM

## 2017-10-10 DIAGNOSIS — M1711 Unilateral primary osteoarthritis, right knee: Secondary | ICD-10-CM

## 2017-10-10 DIAGNOSIS — G8929 Other chronic pain: Secondary | ICD-10-CM

## 2017-10-10 DIAGNOSIS — M1712 Unilateral primary osteoarthritis, left knee: Secondary | ICD-10-CM

## 2017-10-10 DIAGNOSIS — I10 Essential (primary) hypertension: Secondary | ICD-10-CM

## 2017-10-10 MED ORDER — ACETAMINOPHEN-CODEINE #3 300-30 MG PO TABS: 1.0000 | ORAL_TABLET | Freq: Three times a day (TID) | ORAL | 0 refills | Status: DC | PRN

## 2017-10-10 NOTE — Patient Instructions (Signed)

## 2017-10-10 NOTE — Progress Notes (Signed)
Subjective:  Patient ID: Wayne [name], male    DOB: [birth year]  Age: 57 y.o. MRN: [id number]  CC: HTN and knee pain  HPI Wayne L Milleris a 57 y.o.malewith a PMH of HTN,HLD,Gout,and left knee painpresents to f/u on HTN. BP 168/94 mmHg three months ago. BP 132/80 mmHg today. Taking all four anti-hypertensive medications as directed. Denies CP, palpitations, SOB, HA, tingling, numbness, weakness, abdominal pain, f/c/n/v, rash, swelling, or GI/GU sxs.      Main complaint is of bilateral knee pain. Seeing Dr. Erlinda Hong at orthopedics for primary OA of the knee bilaterally. Has received steroid injections with minimal relief. Does not know if OA or gout may be causing his current pain. Says Dr Erlinda Hong has mentioned total knee replacement. Does not endorse any other associated symptoms.     Outpatient Medications Prior to Visit  Medication Sig Dispense Refill  . allopurinol (ZYLOPRIM) 100 MG tablet Take 1 tablet (100 mg total) by mouth 2 (two) times daily. 60 tablet 5  . amLODipine (NORVASC) 10 MG tablet Take 1 tablet (10 mg total) by mouth daily. 90 tablet 1  . aspirin EC 81 MG tablet Take 1 tablet (81 mg total) by mouth daily. 90 tablet 3  . atorvastatin (LIPITOR) 40 MG tablet Take 1 tablet (40 mg total) by mouth daily. 30 tablet 5  . isosorbide mononitrate (IMDUR) 60 MG 24 hr tablet Take 1 tablet (60 mg total) by mouth daily. 30 tablet 5  . losartan (COZAAR) 50 MG tablet Take 1 tablet (50 mg total) by mouth daily. 30 tablet 5  . metoprolol succinate (TOPROL-XL) 25 MG 24 hr tablet Take 1 tablet (25 mg total) by mouth daily. 30 tablet 5   No facility-administered medications prior to visit.      ROS Review of Systems  Constitutional: Negative for chills, fever and malaise/fatigue.  Eyes: Negative for blurred vision.  Respiratory: Negative for shortness of breath.   Cardiovascular: Negative for chest pain and palpitations.  Gastrointestinal: Negative for abdominal pain and nausea.   Genitourinary: Negative for dysuria and hematuria.  Musculoskeletal: Positive for joint pain. Negative for myalgias.  Skin: Negative for rash.  Neurological: Negative for tingling and headaches.  Psychiatric/Behavioral: Negative for depression. The patient is not nervous/anxious.     Objective:  BP (!) 162/82 (BP Location: Left Arm, Patient Position: Sitting, Cuff Size: Large)   Pulse 65   Temp 97.8 F (36.6 C) (Oral)   Ht 6\' 2"  (1.88 m)   Wt 250 lb 9.6 oz (113.7 kg)   SpO2 98%   BMI 32.18 kg/m   BP/Weight 10/10/2017 09/09/2017 3/82/5053  Systolic BP 976 734 193  Diastolic BP 82 90 94  Wt. (Lbs) 250.6 247.2 239.8  BMI 32.18 31.74 30.79      Physical Exam  Constitutional: He is oriented to person, place, and time.  Well developed, well nourished, NAD, polite  HENT:  Head: Normocephalic and atraumatic.  Eyes: No scleral icterus.  Neck: Normal range of motion. Neck supple. No thyromegaly present.  Cardiovascular: Normal rate, regular rhythm and normal heart sounds.  Pulmonary/Chest: Effort normal and breath sounds normal.  Abdominal: Soft. Bowel sounds are normal. There is no tenderness.  Musculoskeletal: He exhibits no edema.  Bony hypertrophy of the knee bilaterally  Neurological: He is alert and oriented to person, place, and time.  Antalgic gait favoring left side  Skin: Skin is warm and dry. No rash noted. No erythema. No pallor.  Psychiatric: He has a normal mood  and affect. His behavior is normal. Thought content normal.  Vitals reviewed.    Assessment & Plan:   1. Hypertension, unspecified type - Continue on current regimen (see outpt meds prior to visit section)  2. Unilateral primary osteoarthritis, right knee - Begin acetaminophen-codeine (TYLENOL #3) 300-30 MG tablet; Take 1 tablet by mouth every 8 (eight) hours as needed for moderate pain.  Dispense: 60 tablet; Refill: 0 - Continue managament with Dr Erlinda Hong, orthopedics  3. Unilateral primary  osteoarthritis, left knee - Begin acetaminophen-codeine (TYLENOL #3) 300-30 MG tablet; Take 1 tablet by mouth every 8 (eight) hours as needed for moderate pain.  Dispense: 60 tablet; Refill: 0 - Continue management with Dr. Erlinda Hong, orthopedics  4. Chronic pain of both knees - Begin acetaminophen-codeine (TYLENOL #3) 300-30 MG tablet; Take 1 tablet by mouth every 8 (eight) hours as needed for moderate pain.  Dispense: 60 tablet; Refill: 0 - Continue management with Dr. Erlinda Hong, orthopedics  5. History of gout - Uric Acid   Meds ordered this encounter  Medications  . acetaminophen-codeine (TYLENOL #3) 300-30 MG tablet    Sig: Take 1 tablet by mouth every 8 (eight) hours as needed for moderate pain.    Dispense:  60 tablet    Refill:  0    Order Specific Question:   Supervising Provider    Answer:   Charlott Rakes [[id number]]    Follow-up: Return in about 4 weeks (around 11/07/2017) for knee pain and gout.   Clent Demark PA

## 2017-10-11 ENCOUNTER — Telehealth (INDEPENDENT_AMBULATORY_CARE_PROVIDER_SITE_OTHER): Payer: Self-pay

## 2017-10-11 LAB — URIC ACID: Uric Acid: 6.2 mg/dL (ref 3.7–8.6)

## 2017-10-11 NOTE — Telephone Encounter (Signed)
-----   Message from Clent Demark, PA-C sent at 10/11/2017 12:25 PM EDT ----- Uric acid slightly elevated. Please follow a low purine diet. Continue management with ortho for OA of the knee.

## 2017-10-11 NOTE — Telephone Encounter (Signed)
Patient is aware that uric acid is slightly elevated; follow a low purine diet. Continue management with ortho for OA of the knee. Nat Christen, CMA

## 2017-10-25 ENCOUNTER — Other Ambulatory Visit: Payer: Self-pay

## 2017-10-25 ENCOUNTER — Emergency Department (HOSPITAL_COMMUNITY)
Admission: EM | Admit: 2017-10-25 | Discharge: 2017-10-25 | Disposition: A | Discharge status: Home / Self Care | Payer: Medicaid Other | Attending: Emergency Medicine | Admitting: Emergency Medicine

## 2017-10-25 ENCOUNTER — Encounter (HOSPITAL_COMMUNITY): Payer: Self-pay | Admitting: Emergency Medicine

## 2017-10-25 DIAGNOSIS — Z79899 Other long term (current) drug therapy: Secondary | ICD-10-CM | POA: Insufficient documentation

## 2017-10-25 DIAGNOSIS — Z7982 Long term (current) use of aspirin: Secondary | ICD-10-CM | POA: Diagnosis not present

## 2017-10-25 DIAGNOSIS — I10 Essential (primary) hypertension: Secondary | ICD-10-CM | POA: Insufficient documentation

## 2017-10-25 DIAGNOSIS — E7800 Pure hypercholesterolemia, unspecified: Secondary | ICD-10-CM | POA: Insufficient documentation

## 2017-10-25 DIAGNOSIS — M25562 Pain in left knee: Secondary | ICD-10-CM | POA: Diagnosis not present

## 2017-10-25 DIAGNOSIS — F17210 Nicotine dependence, cigarettes, uncomplicated: Secondary | ICD-10-CM | POA: Insufficient documentation

## 2017-10-25 MED ORDER — PREDNISONE 10 MG (21) PO TBPK: ORAL_TABLET | Freq: Every day | ORAL | 0 refills | Status: DC

## 2017-10-25 MED ORDER — COLCHICINE 0.6 MG PO TABS: 1.2000 mg | ORAL_TABLET | Freq: Once | ORAL | Status: DC

## 2017-10-25 NOTE — ED Provider Notes (Signed)
[facility] Provider Note   CSN: [id number] Arrival date & time: 10/25/17  1249     History   Chief Complaint Chief Complaint  Patient presents with  . Knee Pain    HPI Wayne [name] is a 57 y.o. male resents for evaluation of knee pain.  Per patient he states he has had chronic intermittent knee pain "for as long as I can remember."  Has been followed by primary care and Dr.Xu with orthopedics for this.  Patient states he saw his primary care doctor for acute on chronic left knee pain last week.  Patient states he was called and told he had an elevated uric acid level and to watch his diet and to start taking Allopurinol.  Patient states he woke this morning with increased pain in his left knee.  States this feels similar to his previous gout attacks.  He states he takes allopurinol intermittently for his gout.  States orthopedic surgery told him that he needed bilateral knee replacements.  Denies fever, chills, injury or trauma to the knee.  Denies calf swelling or calf tenderness. Patient states he also has pain to his right knee since this morning. No edema erythema or warmth to right knee.  HPI  Past Medical History:  Diagnosis Date  . High cholesterol   . Hypertension     Patient Active Problem List   Diagnosis Date Noted  . 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 Essential hypertension 08/12/2017  . Unilateral primary osteoarthritis, left knee 05/25/2016  . Unilateral primary osteoarthritis, right knee 05/25/2016    Past Surgical History:  Procedure Laterality Date  . KNEE ARTHROSCOPY    . LUMBAR DISC SURGERY          Home Medications    Prior to Admission medications   Medication Sig Start Date End Date Taking? Authorizing Provider  acetaminophen-codeine (TYLENOL #3) 300-30 MG tablet Take 1 tablet by mouth every 8 (eight) hours as needed for moderate pain. 10/10/17   Clent Demark, PA-C    allopurinol (ZYLOPRIM) 100 MG tablet Take 1 tablet (100 mg total) by mouth 2 (two) times daily. 07/11/17   Clent Demark, PA-C  amLODipine (NORVASC) 10 MG tablet Take 1 tablet (10 mg total) by mouth daily. 07/11/17   Clent Demark, PA-C  aspirin EC 81 MG tablet Take 1 tablet (81 mg total) by mouth daily. 07/11/17   Clent Demark, PA-C  atorvastatin (LIPITOR) 40 MG tablet Take 1 tablet (40 mg total) by mouth daily. 09/09/17   Clent Demark, PA-C  isosorbide mononitrate (IMDUR) 60 MG 24 hr tablet Take 1 tablet (60 mg total) by mouth daily. 09/09/17   Clent Demark, PA-C  losartan (COZAAR) 50 MG tablet Take 1 tablet (50 mg total) by mouth daily. 08/12/17   Clent Demark, PA-C  metoprolol succinate (TOPROL-XL) 25 MG 24 hr tablet Take 1 tablet (25 mg total) by mouth daily. 08/12/17   Clent Demark, PA-C  predniSONE (STERAPRED UNI-PAK 21 TAB) 10 MG (21) TBPK tablet Take by mouth daily. Take 6 tabs by mouth daily  for 2 days, then 5 tabs for 2 days, then 4 tabs for 2 days, then 3 tabs for 2 days, 2 tabs for 2 days, then 1 tab by mouth daily for 2 days 10/25/17   Davis Ambrosini A, PA-C    Family History No family history on file.  Social History Social History  Tobacco Use  . Smoking status: Current Every Day Smoker    Packs/day: 1.00    Types: Cigarettes  . Smokeless tobacco: Never Used  Substance Use Topics  . Alcohol use: Yes    Alcohol/week: 14.0 standard drinks    Types: 14 Cans of beer per week  . Drug use: No     Allergies   Hydralazine   Review of Systems Review of Systems  Constitutional: Negative for activity change, appetite change, chills, diaphoresis, fatigue, fever and unexpected weight change.  Musculoskeletal: Negative for back pain, gait problem, neck pain and neck stiffness.       Left knee pain and swelling.  Skin: Negative.      Physical Exam Updated Vital Signs BP (!) 171/99 (BP Location: Right Arm)   Pulse 69   Temp 98.9 F  (37.2 C) (Oral)   Resp 17   Ht 6\' 2"  (1.88 m)   Wt 113 kg   SpO2 97%   BMI 31.99 kg/m   Physical Exam  Constitutional: He appears well-developed and well-nourished. No distress.  HENT:  Head: Atraumatic.  Eyes: Pupils are equal, round, and reactive to light.  Neck: Normal range of motion. Neck supple.  Cardiovascular: Normal rate and regular rhythm.  Pulmonary/Chest: Effort normal. No respiratory distress.  Abdominal: Soft. He exhibits no distension.  Musculoskeletal: Normal range of motion.  Tenderness to palpation left knee.  No obvious deformity.  Mild edema to the knee.  No erythema or warmth.  Able to flex and extend knee without difficulty.  5/5 strength in bilateral lower extremity.  No calf swelling or tenderness. No tenderness, edema or erythema to right knee.   Neurological: He is alert.  Skin: Skin is warm and dry. He is not diaphoretic.  Psychiatric: He has a normal mood and affect.  Nursing note and vitals reviewed.    ED Treatments / Results  Labs (all labs ordered are listed, but only abnormal results are displayed) Labs Reviewed - No data to display  EKG None  Radiology No results found.  Procedures Procedures (including critical care time)  Medications Ordered in ED Medications - No data to display   Initial Impression / Assessment and Plan / ED Course  I have reviewed the triage vital signs and the nursing notes.  Pertinent labs & imaging results that were available during my care of the patient were reviewed by me and considered in my medical decision making (see chart for details).  57 year old otherwise well-appearing male presents for evaluation of left knee pain.  Has history of gout and osteoarthritis in his knees.  He is followed by his PCP and orthopedics for his chronic knee problems.  Patient was told he had an elevated uric acid level and was told to watch his diet. Uses Allopurinol intermittently for his gout.  No injury or trauma to  the knee.  Left knee with mild tenderness and edema.  Low suspicion for septic joint.  This is most likely an acute on chronic gout flare as he has had an elevated uric acid level as primary care providers.  Discussed with patient that Allopurinol is not used for an acute gout attack and is used as maintenance. Will prescribe Prednisone and have him follow-up with his primary care or orthopedics for continued pain.  Discussed with patient reasons to return to the emergency department.  Patient voiced understanding is agreeable for return.     Final Clinical Impressions(s) / ED Diagnoses   Final diagnoses:  Acute  pain of left knee    ED Discharge Orders         Ordered    predniSONE (STERAPRED UNI-PAK 21 TAB) 10 MG (21) TBPK tablet  Daily     10/25/17 1505           Coleman Kalas A, PA-C 10/25/17 1509    Tegeler, Gwenyth Allegra, MD 10/25/17 1646

## 2017-10-25 NOTE — Discharge Instructions (Signed)
Evaluated today for knee pain.  I feel this is most likely an acute on chronic gout flare of her knee, as you have had no injury to this knee recently.  I have prescribed you medicine called colchicine.  Please follow the directions on the bottle.  Please follow-up with your primary care provider or orthopedics for reevaluation.  Return to the ED for any new or worsening symptoms such as:  Contact a doctor if: The pain does not stop. The pain changes or gets worse. You have a fever along with knee pain. Your knee gives out or locks up. Your knee swells, and becomes worse. Get help right away if: Your knee feels warm. You cannot move your knee. You have very bad knee pain. You have chest pain. You have trouble breathing.

## 2017-10-25 NOTE — ED Notes (Signed)
Patient verbalized understanding of discharge instructions and prescription medication and denies any further needs or questions at this time. VS stable. Patient ambulatory with steady gait.  

## 2017-10-25 NOTE — ED Triage Notes (Addendum)
Onset a long time ago left knee pain and recently developed increased pain and swelling. Pain currently 8/10 sore. States history of gout.

## 2017-10-25 NOTE — ED Triage Notes (Signed)
PT saw PCP 10-10-17 has not filled RX  For Tylenol #3

## 2017-11-07 ENCOUNTER — Encounter (INDEPENDENT_AMBULATORY_CARE_PROVIDER_SITE_OTHER): Payer: Self-pay | Admitting: Physician Assistant

## 2017-11-07 ENCOUNTER — Other Ambulatory Visit: Payer: Self-pay

## 2017-11-07 ENCOUNTER — Ambulatory Visit (INDEPENDENT_AMBULATORY_CARE_PROVIDER_SITE_OTHER): Payer: Self-pay | Admitting: Physician Assistant

## 2017-11-07 VITALS — BP 158/92 | HR 74 | Temp 97.8°F | Ht 74.0 in | Wt 252.0 lb

## 2017-11-07 DIAGNOSIS — G8929 Other chronic pain: Secondary | ICD-10-CM

## 2017-11-07 DIAGNOSIS — M25562 Pain in left knee: Secondary | ICD-10-CM

## 2017-11-07 DIAGNOSIS — M25561 Pain in right knee: Secondary | ICD-10-CM

## 2017-11-07 MED ORDER — ACETAMINOPHEN-CODEINE #3 300-30 MG PO TABS: 2.0000 | ORAL_TABLET | Freq: Three times a day (TID) | ORAL | 0 refills | Status: AC | PRN

## 2017-11-07 MED ORDER — COLCHICINE 0.6 MG PO TABS: ORAL_TABLET | ORAL | 0 refills | Status: DC

## 2017-11-07 NOTE — Patient Instructions (Signed)

## 2017-11-07 NOTE — Progress Notes (Signed)
Subjective:  Patient ID: Wayne [name], male    DOB: Oct 14, 1960  Age: 57 y.o. MRN: [id number]  CC: Knee pain  HPI Wayne L Milleris a 57 y.o.malewith a PMH of HTN,HLD,Gout,and left knee painpresents with L > R knee pain. Has been diagnosed with primary OA of the knees by Wayne [name] at orthopedics. However patient believes he is having a gout flare. Had been seen at the ED and was prescribed prednisone which helped relieve the pain and swelling. Pain currently 5/10 in the left knee but much greater when weight bearing. Has not been back to Wayne [name] due to not having active CAFA. Does not endorse any other symptoms. Has stopped taking allopurinol during flares of knee pain.      Outpatient Medications Prior to Visit  Medication Sig Dispense Refill  . amLODipine (NORVASC) 10 MG tablet Take 1 tablet (10 mg total) by mouth daily. 90 tablet 1  . aspirin EC 81 MG tablet Take 1 tablet (81 mg total) by mouth daily. 90 tablet 3  . atorvastatin (LIPITOR) 40 MG tablet Take 1 tablet (40 mg total) by mouth daily. 30 tablet 5  . isosorbide mononitrate (IMDUR) 60 MG 24 hr tablet Take 1 tablet (60 mg total) by mouth daily. 30 tablet 5  . losartan (COZAAR) 50 MG tablet Take 1 tablet (50 mg total) by mouth daily. 30 tablet 5  . metoprolol succinate (TOPROL-XL) 25 MG 24 hr tablet Take 1 tablet (25 mg total) by mouth daily. 30 tablet 5  . predniSONE (STERAPRED UNI-PAK 21 TAB) 10 MG (21) TBPK tablet Take by mouth daily. Take 6 tabs by mouth daily  for 2 days, then 5 tabs for 2 days, then 4 tabs for 2 days, then 3 tabs for 2 days, 2 tabs for 2 days, then 1 tab by mouth daily for 2 days 42 tablet 0  . allopurinol (ZYLOPRIM) 100 MG tablet Take 1 tablet (100 mg total) by mouth 2 (two) times daily. (Patient not taking: Reported on 11/07/2017) 60 tablet 5  . acetaminophen-codeine (TYLENOL #3) 300-30 MG tablet Take 1 tablet by mouth every 8 (eight) hours as needed for moderate pain. 60 tablet 0   No  facility-administered medications prior to visit.      ROS Review of Systems  Constitutional: Negative for chills, fever and malaise/fatigue.  Eyes: Negative for blurred vision.  Respiratory: Negative for shortness of breath.   Cardiovascular: Negative for chest pain and palpitations.  Gastrointestinal: Negative for abdominal pain and nausea.  Genitourinary: Negative for dysuria and hematuria.  Musculoskeletal: Positive for joint pain. Negative for myalgias.  Skin: Negative for rash.  Neurological: Negative for tingling and headaches.  Psychiatric/Behavioral: Negative for depression. The patient is not nervous/anxious.     Objective:  BP (!) 158/92 (BP Location: Left Arm, Patient Position: Sitting, Cuff Size: Large)   Pulse 74   Temp 97.8 F (36.6 C) (Oral)   Ht 6\' 2"  (1.88 m)   Wt 252 lb (114.3 kg)   SpO2 99%   BMI 32.35 kg/m   BP/Weight 11/07/2017 10/25/2017 3/76/2831  Systolic BP 517 616 073  Diastolic BP 92 97 80  Wt. (Lbs) 252 249.12 250.6  BMI 32.35 31.99 32.18      Physical Exam  Constitutional: He is oriented to person, place, and time.  Well developed, well nourished, NAD, polite  HENT:  Head: Normocephalic and atraumatic.  Eyes: No scleral icterus.  Neck: Normal range of motion. Neck supple. No thyromegaly present.  Pulmonary/Chest: Effort normal.  Musculoskeletal: He exhibits no edema.  Bony hypertrophy of the knee bilaterally. Mild effusion of left knee without increased warmth or erythema. Full aROM of knee bilaterally.  Neurological: He is alert and oriented to person, place, and time.  Antalgic gait favoring left leg  Skin: Skin is warm and dry. No rash noted. No erythema. No pallor.  Psychiatric: He has a normal mood and affect. His behavior is normal. Thought content normal.  Vitals reviewed.    Assessment & Plan:   1. Chronic pain of both knees - Begin colchicine 0.6 MG tablet; Day one: Take two tablets together, wait one hour, then take one  more tablet. Day 2 and thereafter: Take one table daily.  Dispense: 30 tablet; Refill: 0 -Begin acetaminophen-codeine (TYLENOL #3) 300-30 MG tablet; Take 2 tablets by mouth every 8 (eight) hours as needed for up to 5 days for moderate pain.  Dispense: 30 tablet; Refill: 0 - Complete CAFA to resume treatment with Wayne [name] at orthopedics.  Meds ordered this encounter  Medications  . colchicine 0.6 MG tablet    Sig: Day one: Take two tablets together, wait one hour, then take one more tablet. Day 2 and thereafter: Take one table daily.    Dispense:  30 tablet    Refill:  0    Order Specific Question:   Supervising Provider    Answer:   Charlott Rakes [[id number]]  . acetaminophen-codeine (TYLENOL #3) 300-30 MG tablet    Sig: Take 2 tablets by mouth every 8 (eight) hours as needed for up to 5 days for moderate pain.    Dispense:  30 tablet    Refill:  0    Order Specific Question:   Supervising Provider    Answer:   Charlott Rakes [[id number]]    Follow-up: Return if symptoms worsen or fail to improve.   Clent Demark PA

## 2017-11-13 ENCOUNTER — Ambulatory Visit: Payer: Self-pay | Attending: Family Medicine

## 2017-11-27 MED FILL — !COLCRYS 0.6 MG TABLET: 0.6 MG | 9 days supply | Qty: 10 | Fill #0

## 2017-11-27 MED FILL — ?ATORVASTATIN 40MG TABLET: 40 | 30 days supply | Qty: 30 | Fill #0

## 2017-11-27 MED FILL — ALLOPURINOL 100 MG TABLET: 100 | 30 days supply | Qty: 60 | Fill #0

## 2017-11-27 MED FILL — METOPROLOL SUCCINATE ER 25: 25 | 30 days supply | Qty: 30 | Fill #0

## 2017-11-27 MED FILL — HYDROCHLOROTHIAZIDE 25 MG T: 25 | 30 days supply | Qty: 30 | Fill #0

## 2017-11-27 MED FILL — ISOSORBIDE MN ER 60 MG TAB: 60 | 30 days supply | Qty: 30 | Fill #0

## 2017-11-27 MED FILL — LOSARTAN POTASSIUM 50 MG TA: 50 | 30 days supply | Qty: 30 | Fill #0

## 2017-12-03 ENCOUNTER — Ambulatory Visit (INDEPENDENT_AMBULATORY_CARE_PROVIDER_SITE_OTHER): Payer: Self-pay | Admitting: Physician Assistant

## 2017-12-05 ENCOUNTER — Telehealth: Payer: Self-pay | Admitting: Physician Assistant

## 2017-12-05 NOTE — Telephone Encounter (Signed)
LVM, to Pt informed that we need the 401K from his wife to complete the application if he does not provide the document we need the application will be denied for 6 month and all the bill will be on the Pt

## 2017-12-06 MED FILL — ACETAMINOPHEN/COD #3 TABLET: 300-30 | 5 days supply | Qty: 30 | Fill #0

## 2017-12-12 ENCOUNTER — Telehealth: Payer: Self-pay | Admitting: Physician Assistant

## 2017-12-12 ENCOUNTER — Ambulatory Visit: Payer: Self-pay | Admitting: Gastroenterology

## 2017-12-12 NOTE — Telephone Encounter (Signed)
Spoke with Pt, and ask him that we need the quarter 401K report to complete the application, he said that he will talk to his wife to try to get the information

## 2017-12-14 ENCOUNTER — Emergency Department (HOSPITAL_COMMUNITY): Payer: Medicaid Other

## 2017-12-14 ENCOUNTER — Other Ambulatory Visit: Payer: Self-pay

## 2017-12-14 ENCOUNTER — Encounter (HOSPITAL_COMMUNITY): Payer: Self-pay | Admitting: *Deleted

## 2017-12-14 ENCOUNTER — Emergency Department (HOSPITAL_COMMUNITY)
Admission: EM | Admit: 2017-12-14 | Discharge: 2017-12-14 | Disposition: A | Discharge status: Home / Self Care | Payer: Medicaid Other | Attending: Emergency Medicine | Admitting: Emergency Medicine

## 2017-12-14 DIAGNOSIS — M1712 Unilateral primary osteoarthritis, left knee: Secondary | ICD-10-CM

## 2017-12-14 DIAGNOSIS — I10 Essential (primary) hypertension: Secondary | ICD-10-CM | POA: Insufficient documentation

## 2017-12-14 DIAGNOSIS — M25562 Pain in left knee: Secondary | ICD-10-CM

## 2017-12-14 DIAGNOSIS — Z79899 Other long term (current) drug therapy: Secondary | ICD-10-CM | POA: Diagnosis not present

## 2017-12-14 DIAGNOSIS — M7989 Other specified soft tissue disorders: Secondary | ICD-10-CM | POA: Diagnosis not present

## 2017-12-14 DIAGNOSIS — F17210 Nicotine dependence, cigarettes, uncomplicated: Secondary | ICD-10-CM | POA: Insufficient documentation

## 2017-12-14 DIAGNOSIS — Z7982 Long term (current) use of aspirin: Secondary | ICD-10-CM | POA: Insufficient documentation

## 2017-12-14 DIAGNOSIS — M10062 Idiopathic gout, left knee: Secondary | ICD-10-CM | POA: Diagnosis not present

## 2017-12-14 DIAGNOSIS — M109 Gout, unspecified: Secondary | ICD-10-CM | POA: Diagnosis not present

## 2017-12-14 DIAGNOSIS — M25462 Effusion, left knee: Secondary | ICD-10-CM

## 2017-12-14 MED ORDER — METOPROLOL SUCCINATE ER 25 MG PO TB24
25.0000 mg | ORAL_TABLET | Freq: Once | ORAL | Status: AC
  Administered 2017-12-14: 25 mg via ORAL
  Filled 2017-12-14: qty 1

## 2017-12-14 MED ORDER — PREDNISONE 10 MG (21) PO TBPK: ORAL_TABLET | Freq: Every day | ORAL | 0 refills | Status: DC

## 2017-12-14 MED ORDER — HYDROCODONE-ACETAMINOPHEN 5-325 MG PO TABS: 1.0000 | ORAL_TABLET | Freq: Once | ORAL | Status: DC

## 2017-12-14 NOTE — Discharge Instructions (Signed)
The pain and swelling in your knee is likely related to gout as well as her osteoarthritis, please use steroid prescription as directed and continue taking the Tylenol 3 you have at home.  It is very important that you follow-up with your primary care doctor and orthopedist for further evaluation especially since this is been going on for a prolonged amount of time.  Make sure you are taking your blood pressure medications as directed.  Return to the emergency department if your knee becomes red and hot, you develop fevers it is very difficult and painful for you to bend and straighten the knee or any other new or concerning symptoms occur.

## 2017-12-14 NOTE — ED Triage Notes (Signed)
PT reports he has gout flare up in Lt knee.

## 2017-12-14 NOTE — ED Provider Notes (Signed)
[facility] Provider Note   CSN: [id number] Arrival date & time: 12/14/17  0840     History   Chief Complaint Chief Complaint  Patient presents with  . Gout    HPI Wayne [name] is a 57 y.o. male.  Wayne [name] is a 57 y.o. Male with a history of hypertension, hyperlipidemia and gout, who presents to the emergency department for evaluation of pain and swelling in the left knee.  He reports he was seen here at the beginning of October and diagnosed with a gout flare and treated with steroids which seemed to significantly improve his pain and swelling but after he finished that the swelling seemed to slowly come back and over the past month he has had worsening knee pain and swelling he has been using ice and elevation and cannot seem to get this area to go down.  He also reports a history of severe arthritis in this knee, and was being seen by Dr. Sherrian Divers with orthopedics and told he needed a knee replacement, giving another reason for swelling and inflammation in this knee.  He has not noticed any redness or warmth over the knee and has not had any fevers or chills.  No nausea, vomiting or other systemic symptoms.  He has been ambulatory but has been using crutches intermittently to help with discomfort.  Patient was put on allopurinol by his primary care doctor but reports he is not currently taking this medication, and has not followed up with him with this current flare.     Past Medical History:  Diagnosis Date  . High cholesterol   . Hypertension     Patient Active Problem List   Diagnosis Date Noted  . 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 Essential hypertension 08/12/2017  . Unilateral primary osteoarthritis, left knee 05/25/2016  . Unilateral primary osteoarthritis, right knee 05/25/2016    Past Surgical History:  Procedure Laterality Date  . KNEE ARTHROSCOPY    . LUMBAR DISC SURGERY           Home Medications    Prior to Admission medications   Medication Sig Start Date End Date Taking? Authorizing Provider  allopurinol (ZYLOPRIM) 100 MG tablet Take 1 tablet (100 mg total) by mouth 2 (two) times daily. Patient not taking: Reported on 11/07/2017 07/11/17   Clent Demark, PA-C  amLODipine (NORVASC) 10 MG tablet Take 1 tablet (10 mg total) by mouth daily. 07/11/17   Clent Demark, PA-C  aspirin EC 81 MG tablet Take 1 tablet (81 mg total) by mouth daily. 07/11/17   Clent Demark, PA-C  atorvastatin (LIPITOR) 40 MG tablet Take 1 tablet (40 mg total) by mouth daily. 09/09/17   Clent Demark, PA-C  colchicine 0.6 MG tablet Day one: Take two tablets together, wait one hour, then take one more tablet. Day 2 and thereafter: Take one table daily. 11/07/17   Clent Demark, PA-C  isosorbide mononitrate (IMDUR) 60 MG 24 hr tablet Take 1 tablet (60 mg total) by mouth daily. 09/09/17   Clent Demark, PA-C  losartan (COZAAR) 50 MG tablet Take 1 tablet (50 mg total) by mouth daily. 08/12/17   Clent Demark, PA-C  metoprolol succinate (TOPROL-XL) 25 MG 24 hr tablet Take 1 tablet (25 mg total) by mouth daily. 08/12/17   Clent Demark, PA-C  predniSONE (STERAPRED UNI-PAK 21 TAB) 10 MG (21) TBPK tablet Take by mouth daily. Take  6 tabs by mouth daily  for 2 days, then 5 tabs for 2 days, then 4 tabs for 2 days, then 3 tabs for 2 days, 2 tabs for 2 days, then 1 tab by mouth daily for 2 days 10/25/17   Henderly, Britni A, PA-C    Family History History reviewed. No pertinent family history.  Social History Social History   Tobacco Use  . Smoking status: Current Every Day Smoker    Packs/day: 1.00    Types: Cigarettes  . Smokeless tobacco: Never Used  Substance Use Topics  . Alcohol use: Yes    Alcohol/week: 14.0 standard drinks    Types: 14 Cans of beer per week  . Drug use: No     Allergies   Hydralazine   Review of Systems Review of Systems   Constitutional: Negative for chills and fever.  Eyes: Negative for visual disturbance.  Respiratory: Negative for cough and shortness of breath.   Cardiovascular: Negative for chest pain and leg swelling.  Gastrointestinal: Negative for abdominal pain, nausea and vomiting.  Musculoskeletal: Positive for arthralgias and joint swelling.  Skin: Negative for color change and rash.  Neurological: Negative for weakness and numbness.     Physical Exam Updated Vital Signs BP (!) 156/115 (BP Location: Right Arm)   Pulse (!) 123   Temp 97.9 F (36.6 C) (Oral)   SpO2 100%   Physical Exam  Constitutional: He appears well-developed and well-nourished. No distress.  HENT:  Head: Normocephalic and atraumatic.  Eyes: Right eye exhibits no discharge. Left eye exhibits no discharge.  Cardiovascular: Normal rate, regular rhythm, normal heart sounds and intact distal pulses.  Pulmonary/Chest: Effort normal and breath sounds normal. No respiratory distress.  Musculoskeletal:  The left knee is swollen and tender to palpation, no erythema or warmth, patient is able to bend and extend greater than 90 degrees with some discomfort, no palpable bony deformity, no swelling in the calf or ankle, 2+ DP and PT pulses, sensation and strength intact  Neurological: He is alert. Coordination normal.  Skin: Skin is warm and dry. Capillary refill takes less than 2 seconds. He is not diaphoretic.  Psychiatric: He has a normal mood and affect. His behavior is normal.  Nursing note and vitals reviewed.    ED Treatments / Results  Labs (all labs ordered are listed, but only abnormal results are displayed) Labs Reviewed - No data to display  EKG None  Radiology No results found.  Procedures Procedures (including critical care time)  Medications Ordered in ED Medications  metoprolol succinate (TOPROL-XL) 24 hr tablet 25 mg (25 mg Oral Given 12/14/17 0941)     Initial Impression / Assessment and Plan /  ED Course  I have reviewed the triage vital signs and the nursing notes.  Pertinent labs & imaging results that were available during my care of the patient were reviewed by me and considered in my medical decision making (see chart for details).  She presents for knee pain and swelling that is been going on for the past month, history of gout as well as osteoarthritis within the left knee and has been told he will need an right knee replacement, swelling worsened after being on a course of steroids for prior gout flare.  He is also stopped taking his allopurinol and is not regularly taking his blood pressure medications.  On arrival patient is tachycardic and hypertensive but vitals are otherwise normal he has not taken his metoprolol her blood pressure medication this morning.  Will give dose of metoprolol and see if this improves his heart rate.  On exam he has swelling and pain over the knee but it is not red or warm to the touch and he has not had any fevers or chills he is able to range greater than 90 degrees and I have extremely low suspicion for septic arthritis especially given that this is been going on on a more chronic basis.  Will get x-ray, and plan to start patient on course of steroids but I have also discussed with the patient the importance of continuing to take his allopurinol and following up with his primary care doctor and orthopedist if this persist.  At this time I feel patient is stable for discharge home, x-ray does not show any acute fracture or bony abnormality shows a suprapatellar effusion and tricompartmental arthritis.  Will send patient with Sterapred pack and he has Tylenol 3 at home, return precautions discussed.  Patient expresses understanding and is in agreement with plan.  Vitals:   12/14/17 0848 12/14/17 0956  BP: (!) 156/115 (!) 151/99  Pulse: (!) 123 95  Resp:  18  Temp: 97.9 F (36.6 C)   SpO2: 100% 99%     Final Clinical Impressions(s) / ED Diagnoses    Final diagnoses:  Pain and swelling of left knee  Acute gout of left knee, unspecified cause  Osteoarthritis of left knee, unspecified osteoarthritis type    ED Discharge Orders         Ordered    predniSONE (STERAPRED UNI-PAK 21 TAB) 10 MG (21) TBPK tablet  Daily     12/14/17 0950           Jacqlyn Larsen, PA-C 12/14/17 8588    Julianne Rice, MD 12/15/17 [phone]

## 2017-12-23 ENCOUNTER — Encounter (INDEPENDENT_AMBULATORY_CARE_PROVIDER_SITE_OTHER): Payer: Self-pay | Admitting: Physician Assistant

## 2017-12-23 ENCOUNTER — Other Ambulatory Visit: Payer: Self-pay

## 2017-12-23 ENCOUNTER — Ambulatory Visit (INDEPENDENT_AMBULATORY_CARE_PROVIDER_SITE_OTHER): Payer: Self-pay | Admitting: Physician Assistant

## 2017-12-23 VITALS — BP 132/85 | HR 100 | Temp 97.9°F | Ht 74.0 in | Wt 241.8 lb

## 2017-12-23 DIAGNOSIS — Z8739 Personal history of other diseases of the musculoskeletal system and connective tissue: Secondary | ICD-10-CM

## 2017-12-23 DIAGNOSIS — M25462 Effusion, left knee: Secondary | ICD-10-CM

## 2017-12-23 MED ORDER — ALLOPURINOL 300 MG PO TABS: 300.0000 mg | ORAL_TABLET | Freq: Every day | ORAL | 6 refills | Status: DC

## 2017-12-23 MED ORDER — COLCHICINE 0.6 MG PO TABS: ORAL_TABLET | ORAL | 0 refills | Status: DC

## 2017-12-23 NOTE — Patient Instructions (Signed)
Please go to [facility] located on [address] in [place], adjacent to [facility].

## 2017-12-23 NOTE — Progress Notes (Signed)
Subjective:  Patient ID: Wayne [name], male    DOB: [birth year]  Age: 57 y.o. MRN: [id number]  CC: knee pain  HPI Wayne L Milleris a 57 y.o.malewith a PMH of HTN,HLD,Gout,and left knee painpresents with left knee pain and swelling. Has been diagnosed with primary OA of the knees by Dr. Erlinda Hong at orthopedics. Dr. Erlinda Hong also aspirated effusion six months ago which revealed uric acid crystals. Went to ED nine days ago for right knee pain and was diagnosed with gout flare. Had not been using allopurinol per HPI. Patient reports having right knee pain and swelling for the whole month of November and continues to this day. Unable to bear weight due to pain. Using crutches for ambulation. Does not know why ED refused to drain knee despite a finding of large suprapatellar effusion on imaging. He was sent home with a Sterapred pack and advised to continue his Tylenol #3.  Advised to f/u with PCP.     Outpatient Medications Prior to Visit  Medication Sig Dispense Refill  . amLODipine (NORVASC) 10 MG tablet Take 1 tablet (10 mg total) by mouth daily. 90 tablet 1  . aspirin EC 81 MG tablet Take 1 tablet (81 mg total) by mouth daily. 90 tablet 3  . atorvastatin (LIPITOR) 40 MG tablet Take 1 tablet (40 mg total) by mouth daily. 30 tablet 5  . colchicine 0.6 MG tablet Day one: Take two tablets together, wait one hour, then take one more tablet. Day 2 and thereafter: Take one table daily. 30 tablet 0  . isosorbide mononitrate (IMDUR) 60 MG 24 hr tablet Take 1 tablet (60 mg total) by mouth daily. 30 tablet 5  . losartan (COZAAR) 50 MG tablet Take 1 tablet (50 mg total) by mouth daily. 30 tablet 5  . metoprolol succinate (TOPROL-XL) 25 MG 24 hr tablet Take 1 tablet (25 mg total) by mouth daily. 30 tablet 5  . predniSONE (STERAPRED UNI-PAK 21 TAB) 10 MG (21) TBPK tablet Take by mouth daily. Take 6 tabs by mouth daily  for 2 days, then 5 tabs for 2 days, then 4 tabs for 2 days, then 3 tabs for 2 days, 2 tabs for  2 days, then 1 tab by mouth daily for 2 days 42 tablet 0  . allopurinol (ZYLOPRIM) 100 MG tablet Take 1 tablet (100 mg total) by mouth 2 (two) times daily. (Patient not taking: Reported on 12/23/2017) 60 tablet 5   No facility-administered medications prior to visit.      ROS Review of Systems  Constitutional: Negative for chills, fever and malaise/fatigue.  Eyes: Negative for blurred vision.  Respiratory: Negative for shortness of breath.   Cardiovascular: Negative for chest pain and palpitations.  Gastrointestinal: Negative for abdominal pain and nausea.  Genitourinary: Negative for dysuria and hematuria.  Musculoskeletal: Positive for joint pain. Negative for myalgias.  Skin: Negative for rash.  Neurological: Negative for tingling and headaches.  Psychiatric/Behavioral: Negative for depression. The patient is not nervous/anxious.     Objective:  Ht 6\' 2"  (1.88 m)   Wt 241 lb 12.8 oz (109.7 kg)   BMI 31.05 kg/m   Vitals:   12/23/17 1556  BP: 132/85  Pulse: 100  Temp: 97.9 F (36.6 C)  TempSrc: Oral  SpO2: 97%  Weight: 241 lb 12.8 oz (109.7 kg)  Height: 6\' 2"  (1.88 m)       Physical Exam  Constitutional: He is oriented to person, place, and time.  Well developed, well nourished, in  discomfort due to left knee pain and swelling, polite, using crutches for ambulation.  HENT:  Head: Normocephalic and atraumatic.  Eyes: No scleral icterus.  Neck: Normal range of motion. Neck supple. No thyromegaly present.  Pulmonary/Chest: Effort normal.  Musculoskeletal: He exhibits deformity.  Left knee with large effusion, mildly increased warmth, no overlying erythema, and with TTP  Neurological: He is alert and oriented to person, place, and time.  Skin: Skin is warm and dry. No rash noted. No erythema. No pallor.  Psychiatric: He has a normal mood and affect. His behavior is normal. Thought content normal.  Vitals reviewed.    Assessment & Plan:    1. Effusion of left  knee - No anesthestic available and no depo-medrol available. I have called [facility] and verified that provider on duty can perform knee arthrocentesis. Pt sent to [facility].  - Begin colchicine 0.6 MG tablet; Day one: Take two tablets together, wait one hour, then take one more tablet. Day 2 and thereafter: Take one table daily. Suspend Atorvastatin while taking Colchicine.  Dispense: 30 tablet; Refill: 0 - Begin allopurinol (ZYLOPRIM) 300 MG tablet; Take 1 tablet (300 mg total) by mouth daily.  Dispense: 30 tablet; Refill: 6  2. History of gout - Begin colchicine 0.6 MG tablet; Day one: Take two tablets together, wait one hour, then take one more tablet. Day 2 and thereafter: Take one table daily. Suspend Atorvastatin while taking Colchicine.  Dispense: 30 tablet; Refill: 0 - Begin allopurinol (ZYLOPRIM) 300 MG tablet; Take 1 tablet (300 mg total) by mouth daily.  Dispense: 30 tablet; Refill: 6   Meds ordered this encounter  Medications  . colchicine 0.6 MG tablet    Sig: Day one: Take two tablets together, wait one hour, then take one more tablet. Day 2 and thereafter: Take one table daily. Suspend Atorvastatin while taking Colchicine.    Dispense:  30 tablet    Refill:  0    Order Specific Question:   Supervising Provider    Answer:   Charlott Rakes [[id number]]  . allopurinol (ZYLOPRIM) 300 MG tablet    Sig: Take 1 tablet (300 mg total) by mouth daily.    Dispense:  30 tablet    Refill:  6    Order Specific Question:   Supervising Provider    Answer:   Charlott Rakes [[id number]]    Follow-up: Return if symptoms worsen or fail to improve.   Clent Demark PA

## 2017-12-24 ENCOUNTER — Other Ambulatory Visit: Payer: Self-pay

## 2017-12-24 ENCOUNTER — Ambulatory Visit (HOSPITAL_COMMUNITY)
Admission: EM | Admit: 2017-12-24 | Discharge: 2017-12-24 | Disposition: A | Discharge status: Home / Self Care | Payer: Self-pay | Attending: Family Medicine | Admitting: Family Medicine

## 2017-12-24 ENCOUNTER — Encounter (HOSPITAL_COMMUNITY): Payer: Self-pay | Admitting: Emergency Medicine

## 2017-12-24 DIAGNOSIS — M25462 Effusion, left knee: Secondary | ICD-10-CM

## 2017-12-24 DIAGNOSIS — M109 Gout, unspecified: Secondary | ICD-10-CM

## 2017-12-24 HISTORY — DX: Gout, unspecified: M10.9

## 2017-12-24 MED FILL — ?ALLOPURINOL 300 MG TABLET: 300 | 30 days supply | Qty: 30 | Fill #0

## 2017-12-24 NOTE — ED Triage Notes (Signed)
Left knee pain, chronic issues with gout.

## 2017-12-24 NOTE — ED Provider Notes (Signed)
[place]   [id number] 12/24/17 Arrival Time: 9937  CC: Left knee pain  SUBJECTIVE: History from: patient. Wayne [name] is a 57 y.o. male hx significant for gout and left knee DJD, complains of left knee pain that began a 1 month ago.  Was seen in the ED on 12/14/17 for gout flare and treated with steroid dose pak.  Reports minimal improvement with dose pak.  Patient was seen by PCP yesterday and prescribed allopurinol and colchicine.  Patient was also instructed to follow up here to have knee drained by his PCP.  Patient has not filled his prescriptions from his PCP and continues to have pain.  Request for his knee to be drained.  Localizes the pain to the left knee. Describes the pain as constant and sharp in character.  Symptoms are made worse with weight-bearing.  Reports similar symptoms in the past.  Denies fever, chills, erythema, ecchymosis, effusion, weakness, numbness and tingling.      ROS: As per HPI.  Past Medical History:  Diagnosis Date  . Gout   . High cholesterol   . Hypertension    Past Surgical History:  Procedure Laterality Date  . KNEE ARTHROSCOPY    . LUMBAR DISC SURGERY     Allergies  Allergen Reactions  . Hydralazine Other (See Comments)    Headache   No current facility-administered medications on file prior to encounter.    Current Outpatient Medications on File Prior to Encounter  Medication Sig Dispense Refill  . amLODipine (NORVASC) 10 MG tablet Take 1 tablet (10 mg total) by mouth daily. 90 tablet 1  . aspirin EC 81 MG tablet Take 1 tablet (81 mg total) by mouth daily. 90 tablet 3  . isosorbide mononitrate (IMDUR) 60 MG 24 hr tablet Take 1 tablet (60 mg total) by mouth daily. 30 tablet 5  . losartan (COZAAR) 50 MG tablet Take 1 tablet (50 mg total) by mouth daily. 30 tablet 5  . metoprolol succinate (TOPROL-XL) 25 MG 24 hr tablet Take 1 tablet (25 mg total) by mouth daily. 30 tablet 5  . predniSONE (STERAPRED UNI-PAK 21 TAB) 10 MG  (21) TBPK tablet Take by mouth daily. Take 6 tabs by mouth daily  for 2 days, then 5 tabs for 2 days, then 4 tabs for 2 days, then 3 tabs for 2 days, 2 tabs for 2 days, then 1 tab by mouth daily for 2 days 42 tablet 0  . allopurinol (ZYLOPRIM) 300 MG tablet Take 1 tablet (300 mg total) by mouth daily. (Patient not taking: Reported on 12/24/2017) 30 tablet 6  . atorvastatin (LIPITOR) 40 MG tablet Take 1 tablet (40 mg total) by mouth daily. (Patient not taking: Reported on 12/24/2017) 30 tablet 5  . colchicine 0.6 MG tablet Day one: Take two tablets together, wait one hour, then take one more tablet. Day 2 and thereafter: Take one table daily. Suspend Atorvastatin while taking Colchicine. 30 tablet 0   Social History   Socioeconomic History  . Marital status: Married    Spouse name: Not on file  . Number of children: Not on file  . Years of education: Not on file  . Highest education level: Not on file  Occupational History  . Not on file  Social Needs  . Financial resource strain: Not on file  . Food insecurity:    Worry: Not on file    Inability: Not on file  . Transportation needs:    Medical: Not on file  Non-medical: Not on file  Tobacco Use  . Smoking status: Current Every Day Smoker    Packs/day: 1.00    Types: Cigarettes  . Smokeless tobacco: Never Used  Substance and Sexual Activity  . Alcohol use: Yes    Alcohol/week: 14.0 standard drinks    Types: 14 Cans of beer per week  . Drug use: No  . Sexual activity: Not on file  Lifestyle  . Physical activity:    Days per week: Not on file    Minutes per session: Not on file  . Stress: Not on file  Relationships  . Social connections:    Talks on phone: Not on file    Gets together: Not on file    Attends religious service: Not on file    Active member of club or organization: Not on file    Attends meetings of clubs or organizations: Not on file    Relationship status: Not on file  . Intimate partner violence:    Fear  of current or ex partner: Not on file    Emotionally abused: Not on file    Physically abused: Not on file    Forced sexual activity: Not on file  Other Topics Concern  . Not on file  Social History Narrative  . Not on file   No family history on file.  OBJECTIVE:  Vitals:   12/24/17 1734 12/24/17 1737  BP: (!) 175/83 (!) 153/86  Pulse: 86   Resp: 18   Temp: 97.8 F (36.6 C)   TempSrc: Oral   SpO2: 99%     General appearance: AOx3; in no acute distress.  Head: NCAT Lungs: CTA bilaterally Heart: RRR.  Clear S1 and S2 without murmur, gallops, or rubs.  Radial pulses 2+ bilaterally. Musculoskeletal: Left knee Inspection: Skin warm, dry, clear and intact without obvious erythema, or ecchymosis. Large left knee joint effusion (see picture below) Palpation: Tender to palpation over anterior aspect of patella ROM: LROM Strength: 4+/5 knee flexion, 4+/5 knee extension Skin: warm and dry Neurologic: Antalgic gait, ambulates with crutches; Sensation intact about the lower extremities Psychological: alert and cooperative; normal mood and affect      PROCEDURE:  Consent granted.  Risk and benefits of procedure discussed with patient.  Alternative treatment options discussed.  Area cleansed with alcohol pad.  Biofreeze used to obtain local anesthetic. 18 gauge needle used to draw off approximately 5 CC of yellow fluid from supralateral approach of the left knee.  Aspiration also attempted from supramedial approach without fluid.  Patient tolerated procedure well.  Minimal bleeding.    ASSESSMENT & PLAN:  1. Acute gout of left knee, unspecified cause   2. Effusion of left knee    No orders of the defined types were placed in this encounter.  Knee aspiration performed Rest ice and elevate Fill colchicine prescription and begin taking immediately Follow up with PCP if symptoms persists Return or go to the ED if you have any new or worsening symptoms such as worsening pain,  redness, swelling, fever, chills, nausea, vomiting, etc...  Reviewed expectations re: course of current medical issues. Questions answered. Outlined signs and symptoms indicating need for more acute intervention. Patient verbalized understanding. After Visit Summary given.    Lestine Box, PA-C 12/24/17 2132

## 2017-12-24 NOTE — Discharge Instructions (Addendum)
Knee aspiration performed Rest ice and elevate Fill colchicine prescription and begin taking immediately Follow up with PCP if symptoms persists Return or go to the ED if you have any new or worsening symptoms such as worsening pain, redness, swelling, fever, chills, nausea, vomiting, etc..Marland Kitchen

## 2017-12-26 MED FILL — !COLCRYS 0.6 MG TABLET: 0.6 MG | 9 days supply | Qty: 10 | Fill #1

## 2018-01-07 ENCOUNTER — Encounter (INDEPENDENT_AMBULATORY_CARE_PROVIDER_SITE_OTHER): Payer: Self-pay | Admitting: Physician Assistant

## 2018-01-07 ENCOUNTER — Ambulatory Visit (INDEPENDENT_AMBULATORY_CARE_PROVIDER_SITE_OTHER): Payer: Self-pay | Admitting: Physician Assistant

## 2018-01-07 ENCOUNTER — Other Ambulatory Visit: Payer: Self-pay

## 2018-01-07 VITALS — BP 115/87 | HR 117 | Temp 98.0°F | Ht 74.0 in | Wt 222.2 lb

## 2018-01-07 DIAGNOSIS — M25462 Effusion, left knee: Secondary | ICD-10-CM

## 2018-01-07 DIAGNOSIS — M25561 Pain in right knee: Secondary | ICD-10-CM

## 2018-01-07 DIAGNOSIS — G4701 Insomnia due to medical condition: Secondary | ICD-10-CM

## 2018-01-07 DIAGNOSIS — M1712 Unilateral primary osteoarthritis, left knee: Secondary | ICD-10-CM

## 2018-01-07 DIAGNOSIS — G8929 Other chronic pain: Secondary | ICD-10-CM

## 2018-01-07 DIAGNOSIS — M25562 Pain in left knee: Secondary | ICD-10-CM

## 2018-01-07 MED ORDER — ZOLPIDEM TARTRATE 5 MG PO TABS: 5.0000 mg | ORAL_TABLET | Freq: Every evening | ORAL | 1 refills | Status: DC | PRN

## 2018-01-07 MED ORDER — HYDROCODONE-ACETAMINOPHEN 10-325 MG PO TABS: 1.0000 | ORAL_TABLET | Freq: Four times a day (QID) | ORAL | 0 refills | Status: AC | PRN

## 2018-01-07 NOTE — Progress Notes (Signed)
Subjective:  Patient ID: Wayne [name], male    DOB: Sep 13, 1960  Age: 57 y.o. MRN: [id number]  CC:   HPI Wayne Vangorder Milleris a 57 y.o.malewith a PMH of HTN,HLD,Gout,and left knee painpresentswith left knee pain. Has been diagnosed with primary OA of the knees by Dr. Erlinda Hong at orthopedics. Seen here 15 days ago with an effusion and advised to go to Urgent Care for arthrocentesis. Knee was aspirated with 5 cc of yellow fluid drained. Returns today with recurrence of effusion. Has begun to take colchicine. Has not been able to go to orthopedics because he does not have CFA. He can not reapply until 02/12/18. Pain currently 5/10 in the left knee but much greater when weight bearing. Does not endorse any other symptoms.      Outpatient Medications Prior to Visit  Medication Sig Dispense Refill  . amLODipine (NORVASC) 10 MG tablet Take 1 tablet (10 mg total) by mouth daily. 90 tablet 1  . aspirin EC 81 MG tablet Take 1 tablet (81 mg total) by mouth daily. 90 tablet 3  . colchicine 0.6 MG tablet Day one: Take two tablets together, wait one hour, then take one more tablet. Day 2 and thereafter: Take one table daily. Suspend Atorvastatin while taking Colchicine. 30 tablet 0  . hydrochlorothiazide (HYDRODIURIL) 25 MG tablet   3  . isosorbide mononitrate (IMDUR) 60 MG 24 hr tablet Take 1 tablet (60 mg total) by mouth daily. 30 tablet 5  . losartan (COZAAR) 50 MG tablet Take 1 tablet (50 mg total) by mouth daily. 30 tablet 5  . metoprolol succinate (TOPROL-XL) 25 MG 24 hr tablet Take 1 tablet (25 mg total) by mouth daily. 30 tablet 5  . allopurinol (ZYLOPRIM) 300 MG tablet Take 1 tablet (300 mg total) by mouth daily. (Patient not taking: Reported on 12/24/2017) 30 tablet 6  . atorvastatin (LIPITOR) 40 MG tablet Take 1 tablet (40 mg total) by mouth daily. (Patient not taking: Reported on 12/24/2017) 30 tablet 5  . predniSONE (STERAPRED UNI-PAK 21 TAB) 10 MG (21) TBPK tablet Take by mouth daily. Take 6  tabs by mouth daily  for 2 days, then 5 tabs for 2 days, then 4 tabs for 2 days, then 3 tabs for 2 days, 2 tabs for 2 days, then 1 tab by mouth daily for 2 days 42 tablet 0   No facility-administered medications prior to visit.      ROS Review of Systems  Constitutional: Negative for chills, fever and malaise/fatigue.  Eyes: Negative for blurred vision.  Respiratory: Negative for shortness of breath.   Cardiovascular: Negative for chest pain and palpitations.  Gastrointestinal: Negative for abdominal pain and nausea.  Genitourinary: Negative for dysuria and hematuria.  Musculoskeletal: Positive for joint pain. Negative for myalgias.  Skin: Negative for rash.  Neurological: Negative for tingling and headaches.  Psychiatric/Behavioral: Negative for depression. The patient is not nervous/anxious.     Objective:  BP 115/87 (BP Location: Left Arm, Patient Position: Sitting, Cuff Size: Normal)   Pulse (!) 117   Temp 98 F (36.7 C) (Oral)   Ht 6\' 2"  (1.88 m)   Wt 222 lb 3.2 oz (100.8 kg)   SpO2 97%   BMI 28.53 kg/m   BP/Weight 01/07/2018 12/24/2017 52/07/7822  Systolic BP 235 361 443  Diastolic BP 87 86 85  Wt. (Lbs) 222.2 - 241.8  BMI 28.53 - 31.05      Physical Exam Vitals signs reviewed.  Constitutional:  Comments: Well developed, well nourished, NAD, polite, using crutches.   HENT:     Head: Normocephalic and atraumatic.  Eyes:     General: No scleral icterus. Neck:     Musculoskeletal: Normal range of motion and neck supple.     Thyroid: No thyromegaly.  Cardiovascular:     Rate and Rhythm: Normal rate and regular rhythm.     Heart sounds: Normal heart sounds.  Pulmonary:     Effort: Pulmonary effort is normal.     Breath sounds: Normal breath sounds.  Musculoskeletal:     Comments: Left knee with mild erythema and fluctuation; very tender to palpation.   Neurological:     Mental Status: He is alert and oriented to person, place, and time.     Comments:  Antalgic gait due to bilateral knee pain  Psychiatric:        Behavior: Behavior normal.        Thought Content: Thought content normal.      Assessment & Plan:    1. Effusion of left knee - Pt sent to ED for knee arthrocentesis. Advised to make sure he gathers all pertinent documentation to be able to complete his CFA. Not eligible to reapply until 02/12/18.  - Advised to continue taking colchicine. He has suspended atorvastatin as directed. Will wait to take allopurinol once inflammation and pain has subsided.   2. Unilateral primary osteoarthritis, left knee - Begin HYDROcodone-acetaminophen (NORCO) 10-325 MG tablet; Take 1 tablet by mouth every 6 (six) hours as needed for up to 7 days.  Dispense: 28 tablet; Refill: 0  3. Chronic pain of both knees - Begin HYDROcodone-acetaminophen (NORCO) 10-325 MG tablet; Take 1 tablet by mouth every 6 (six) hours as needed for up to 7 days.  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  Dispense: 28 tablet; Refill: 0 - Begin zolpidem (AMBIEN) 5 MG tablet; Take 1 tablet (5 mg total) by mouth at bedtime as needed for sleep.  Dispense: 15 tablet; Refill: 1   Meds ordered this encounter  Medications  . HYDROcodone-acetaminophen (NORCO) 10-325 MG tablet    Sig: Take 1 tablet by mouth every 6 (six) hours as needed for up to 7 days.    Dispense:  28 tablet    Refill:  0    Order Specific Question:   Supervising Provider    Answer:   Charlott Rakes [[id number]]  . zolpidem (AMBIEN) 5 MG tablet    Sig: Take 1 tablet (5 mg total) by mouth at bedtime as needed for sleep.    Dispense:  15 tablet    Refill:  1    Order Specific Question:   Supervising Provider    Answer:   Charlott Rakes [[id number]]    Follow-up: Return in about 4 weeks (around 02/04/2018) for OA knees and f/u CFA.   Clent Demark  PA

## 2018-01-07 NOTE — Patient Instructions (Signed)
You will have to complete [facility] as soon as possible.   You will need to proceed to Urgent Care or Emergency Room to have the fluid in your knee removed.

## 2018-01-12 ENCOUNTER — Encounter (HOSPITAL_COMMUNITY): Payer: Self-pay

## 2018-01-12 ENCOUNTER — Emergency Department (HOSPITAL_COMMUNITY)
Admission: EM | Admit: 2018-01-12 | Discharge: 2018-01-12 | Disposition: A | Discharge status: Home / Self Care | Payer: Medicaid Other | Attending: Emergency Medicine | Admitting: Emergency Medicine

## 2018-01-12 DIAGNOSIS — F17210 Nicotine dependence, cigarettes, uncomplicated: Secondary | ICD-10-CM | POA: Diagnosis not present

## 2018-01-12 DIAGNOSIS — M25462 Effusion, left knee: Secondary | ICD-10-CM | POA: Diagnosis not present

## 2018-01-12 DIAGNOSIS — I10 Essential (primary) hypertension: Secondary | ICD-10-CM | POA: Insufficient documentation

## 2018-01-12 DIAGNOSIS — Z79899 Other long term (current) drug therapy: Secondary | ICD-10-CM | POA: Diagnosis not present

## 2018-01-12 MED ORDER — LIDOCAINE HCL (PF) 1 % IJ SOLN
5.0000 mL | Freq: Once | INTRAMUSCULAR | Status: AC
  Administered 2018-01-12: 5 mL
  Filled 2018-01-12: qty 5

## 2018-01-12 NOTE — ED Triage Notes (Signed)
Pt here for chronic left knee pain. Pt was seen at UC last week and had some fluid drawn and states he needs another "knee tap"

## 2018-01-12 NOTE — ED Provider Notes (Addendum)
Complains of left knee pain and swelling for several months, unchanged.  Patient had arthrocentesis performed on 12/24/2017.  Fluid not sent for analysis.  On exam patient nontoxic.  Left lower extremity knee is swollen and tender.  With obvious effusion.  Not red or warm. Strongly doubt infection.  Symptoms chronic.   Orlie Dakin, MD 01/12/18 1253 Ms Valere Dross attempted arthrocentesis of left knee without success.  I also attempted arthrocentesis.  Performed.  Left knee prepped with Betadine.  Numbed locally at the medial aspect of the patella with 1% lidocaine.  An 18-gauge needle was inserted at the inferomedial aspect of the left patella, no fluid was aspirated patient tolerated procedure well   Orlie Dakin, MD 01/12/18 1457

## 2018-01-12 NOTE — ED Provider Notes (Signed)
[facility] EMERGENCY DEPARTMENT Provider Note   CSN: [id number] Arrival date & time: 01/12/18  1219     History   Chief Complaint Chief Complaint  Patient presents with  . Knee Pain    HPI Wayne [name] is a 57 y.o. male.  HPI  Patient is a 57 year old male with a history of hypertension, hyperlipidemia, gout, and osteoarthritis of bilateral knees presenting for chronic left knee effusion.  He reports that his been his fullness is today for the past 2 to 3 months.  Nothing new occurred today other than his primary doctor told him to come to the emergency department for further evaluation of possible aspiration due to ongoing effusion. Denies any erythema of knee today. Denies any fever, chills.  Patient denies any history of immune compromise status.  His current outpatient regimen includes allopurinol, colchicine, and he had a recent course of steroids.  He is also on hydrocodone for the pain.  No history of immune compromise status.  Past Medical History:  Diagnosis Date  . Gout   . High cholesterol   . Hypertension     Patient Active Problem List   Diagnosis Date Noted  . 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 Essential hypertension 08/12/2017  . Unilateral primary osteoarthritis, left knee 05/25/2016  . Unilateral primary osteoarthritis, right knee 05/25/2016    Past Surgical History:  Procedure Laterality Date  . KNEE ARTHROSCOPY    . LUMBAR DISC SURGERY          Home Medications    Prior to Admission medications   Medication Sig Start Date End Date Taking? Authorizing Provider  allopurinol (ZYLOPRIM) 300 MG tablet Take 1 tablet (300 mg total) by mouth daily. Patient not taking: Reported on 12/24/2017 12/23/17   Clent Demark, PA-C  amLODipine (NORVASC) 10 MG tablet Take 1 tablet (10 mg total) by mouth daily. 07/11/17   Clent Demark, PA-C  aspirin EC 81 MG tablet Take 1 tablet (81 mg total) by mouth  daily. 07/11/17   Clent Demark, PA-C  atorvastatin (LIPITOR) 40 MG tablet Take 1 tablet (40 mg total) by mouth daily. Patient not taking: Reported on 12/24/2017 09/09/17   Clent Demark, PA-C  colchicine 0.6 MG tablet Day one: Take two tablets together, wait one hour, then take one more tablet. Day 2 and thereafter: Take one table daily. Suspend Atorvastatin while taking Colchicine. 12/23/17   Clent Demark, PA-C  HYDROcodone-acetaminophen [facility]) 10-325 MG tablet Take 1 tablet by mouth every 6 (six) hours as needed for up to 7 days. 01/07/18 01/14/18  Clent Demark, PA-C  isosorbide mononitrate (IMDUR) 60 MG 24 hr tablet Take 1 tablet (60 mg total) by mouth daily. 09/09/17   Clent Demark, PA-C  losartan (COZAAR) 50 MG tablet Take 1 tablet (50 mg total) by mouth daily. 08/12/17   Clent Demark, PA-C  metoprolol succinate (TOPROL-XL) 25 MG 24 hr tablet Take 1 tablet (25 mg total) by mouth daily. 08/12/17   Clent Demark, PA-C  zolpidem (AMBIEN) 5 MG tablet Take 1 tablet (5 mg total) by mouth at bedtime as needed for sleep. 01/07/18   Clent Demark, PA-C    Family History No family history on file.  Social History Social History   Tobacco Use  . Smoking status: Current Every Day Smoker    Packs/day: 1.00    Types: Cigarettes  . Smokeless tobacco: Never Used  Substance Use  Topics  . Alcohol use: Yes    Alcohol/week: 14.0 standard drinks    Types: 14 Cans of beer per week  . Drug use: No     Allergies   Hydralazine   Review of Systems Review of Systems  Constitutional: Negative for chills and fever.  Musculoskeletal: Positive for arthralgias, joint swelling and myalgias.  Neurological: Negative for weakness and numbness.     Physical Exam Updated Vital Signs BP (!) 153/97 (BP Location: Right Arm)   Pulse (!) 104   Temp (!) 97.5 F (36.4 C) (Oral)   Resp 17   Ht 6\' 2"  (1.88 m)   Wt 110.7 kg   SpO2 93%   BMI 31.33 kg/m   Physical  Exam Vitals signs and nursing note reviewed.  Constitutional:      General: He is not in acute distress.    Appearance: He is well-developed. He is not diaphoretic.     Comments: Sitting comfortably in bed.  HENT:     Head: Normocephalic and atraumatic.  Eyes:     General:        Right eye: No discharge.        Left eye: No discharge.     Conjunctiva/sclera: Conjunctivae normal.     Comments: EOMs normal to gross examination.  Neck:     Musculoskeletal: Normal range of motion.  Cardiovascular:     Rate and Rhythm: Normal rate and regular rhythm.     Comments: Intact, 2+ DP pulse bilaterally. Abdominal:     General: There is no distension.  Musculoskeletal:        General: Swelling present.     Comments: Left knee with tenderness to palpation of anterior medial knee. Decreased ROM 2/2 pain, but can perform passive ROM without difficulty. =No joint line tenderness.  Patient has obvious effusion particularly of the anterior medial knee.  There is no erythema or edema.  No abnormal alignment or patellar mobility. No bruising, erythema or warmth overlaying the joint. No varus/valgus laxity. Negative drawer's, Lachman's and McMurray's.  No crepitus.  2+ DP pulses bilaterally. All compartments are soft. Sensation intact distal to injury.   Skin:    General: Skin is warm and dry.  Neurological:     Mental Status: He is alert.     Comments: Cranial nerves intact to gross observation. Patient moves extremities without difficulty.  Psychiatric:        Behavior: Behavior normal.        Thought Content: Thought content normal.        Judgment: Judgment normal.        ED Treatments / Results  Labs (all labs ordered are listed, but only abnormal results are displayed) Labs Reviewed - No data to display  EKG None  Radiology No results found.  Procedures .Joint Aspiration/Arthrocentesis Date/Time: 01/12/2018 2:56 PM Performed by: Albesa Seen, PA-C Authorized by: Albesa Seen, PA-C   Consent:    Consent obtained:  Verbal   Consent given by:  Patient   Risks discussed:  Bleeding, infection, pain and incomplete drainage   Alternatives discussed:  No treatment Location:    Location:  Knee   Knee:  L knee Anesthesia (see MAR for exact dosages):    Anesthesia method:  Local infiltration   Local anesthetic:  Lidocaine 1% w/o epi Procedure details:    Preparation: Patient was prepped and draped in usual sterile fashion     Needle gauge:  18 G   Approach:  Superior   Aspirate amount:  None; unsuccessful x 2    Steroid injected: no     Specimen collected: no   Post-procedure details:    Dressing:  Adhesive bandage   Patient tolerance of procedure:  Tolerated well, no immediate complications Comments:     Patient draped and prepped in usual sterile fashion. Betadine applied and allowed to dry. 5 cc lidocaine injected in superomedial patella.  Aspiration unsuccessful.  Attempt #2 by Dr. Hoover Brunette after insertion of 5 cc of lidocaine in the inferomedial patella. Aspiration unsuccessful. Procedure terminated.  Patient tolerated well.   (including critical care time)  Medications Ordered in ED Medications - No data to display   Initial Impression / Assessment and Plan / ED Course  I have reviewed the triage vital signs and the nursing notes.  Pertinent labs & imaging results that were available during my care of the patient were reviewed by me and considered in my medical decision making (see chart for details).     Patient is nontoxic-appearing, afebrile, and in no acute distress.  Knee pain is chronic, and effusion appears chronic.  It is been tapped multiple times in the last 6 months.  Most recently on 12-24-2017.  This was not sent for fluid analysis, however review of the urgent care's note suggest return of yellow fluid.  Low suspicion for septic arthritis.  Given recent arthrocentesis without relief, do not feel that patient would get  significant improvement today.  He may have concomitant hemarthrosis.  Patient is previously seen Dr. Erlinda Hong of [facility] orthopedics.  Will consult [facility] to see if patient can get close follow-up for this, as it appears chronic and debilitating.  Spoke with Dr. Ninfa Linden of [facility] orthopedics who recommended if patient is amenable to continue to do another arthrocentesis for chronic effusion.  States that patient can call to make an appointment with Dr. Erlinda Hong.   Patient stated that he would like to do another arthrocentesis.  Reviewed risks and benefits with the patient including infection, hemarthrosis, or no improvement in pain.  He would like to proceed with arthrocentesis.   Patient instructed to follow-up with Dr. Erlinda Hong as soon as possible.  Discussed return precautions for any worsening pain, swelling, erythema, fever, chills, or any new or worsening symptoms.  Final Clinical Impressions(s) / ED Diagnoses   Final diagnoses:  Effusion, left knee  Elevated blood pressure reading with diagnosis of hypertension    ED Discharge Orders    None       Tamala Julian 01/12/18 1459    Orlie Dakin, MD 01/12/18 [id number]

## 2018-01-12 NOTE — ED Notes (Signed)
Pt. Stated "I already had that last time, it didn't do anything." pt. Refuse and stated "they might as well get fluid off my knee"

## 2018-01-12 NOTE — Discharge Instructions (Addendum)
It was my pleasure taking care of you today!   Please see the information and instructions below regarding your visit.  Your diagnoses today include:  1. Effusion, left knee   2. Elevated blood pressure reading with diagnosis of hypertension     Tests performed today include: See side panel of your discharge paperwork for testing performed today. Vital signs are listed at the bottom of these instructions.   Medications prescribed:    Take any prescribed medications only as prescribed, and any over the counter medications only as directed on the packaging.  Continue home hydrocodone as needed for pain. Use crutches as needed for comfort. Ice and elevate knee throughout the day.  Home care instructions:  Please follow any educational materials contained in this packet.   Elevate the left leg daily.  Please apply the Ace wrap we gave you today.  Follow-up instructions:  Call the orthopedist listed today or tomorrow to schedule follow up appointment for recheck of ongoing knee pain in one to two weeks. That appointment can be canceled with a 24-48 hour notice if complete resolution of pain.   Follow up with the orthopedist listed if symptoms do not improve in one week.   Return instructions:  Please return to the Emergency Department if you experience worsening symptoms.  Please return for any increasing swelling, increasing pain, loss of color to the lower leg, or increasing redness. Please return if you have any other emergent concerns.  Additional Information:   Your vital signs today were: BP (!) 153/97 (BP Location: Right Arm)    Pulse (!) 104    Temp (!) 97.5 F (36.4 C) (Oral)    Resp 17    Ht 6\' 2"  (1.88 m)    Wt 110.7 kg    SpO2 93%    BMI 31.33 kg/m  If your blood pressure (BP) was elevated on multiple readings during this visit above 130 for the top number or above 80 for the bottom number, please have this repeated by your primary care provider within one  month. --------------

## 2018-01-12 NOTE — ED Notes (Signed)
ED Provider at bedside. 

## 2018-01-28 ENCOUNTER — Ambulatory Visit (INDEPENDENT_AMBULATORY_CARE_PROVIDER_SITE_OTHER): Payer: Self-pay | Admitting: Orthopaedic Surgery

## 2018-01-31 ENCOUNTER — Encounter (INDEPENDENT_AMBULATORY_CARE_PROVIDER_SITE_OTHER): Payer: Self-pay | Admitting: Orthopaedic Surgery

## 2018-01-31 ENCOUNTER — Ambulatory Visit (INDEPENDENT_AMBULATORY_CARE_PROVIDER_SITE_OTHER): Payer: Self-pay | Admitting: Orthopaedic Surgery

## 2018-01-31 DIAGNOSIS — M1712 Unilateral primary osteoarthritis, left knee: Secondary | ICD-10-CM

## 2018-01-31 MED ORDER — BUPIVACAINE HCL 0.25 % IJ SOLN
2.0000 mL | INTRAMUSCULAR | Status: AC | PRN
  Administered 2018-01-31: 2 mL via INTRA_ARTICULAR

## 2018-01-31 MED ORDER — METHYLPREDNISOLONE ACETATE 40 MG/ML IJ SUSP
40.0000 mg | INTRAMUSCULAR | Status: AC | PRN
  Administered 2018-01-31: 40 mg via INTRA_ARTICULAR

## 2018-01-31 MED ORDER — LIDOCAINE HCL 1 % IJ SOLN
2.0000 mL | INTRAMUSCULAR | Status: AC | PRN
  Administered 2018-01-31: 2 mL

## 2018-01-31 NOTE — Progress Notes (Signed)
Subjective: He is here for ultrasound-guided knee aspiration and injection.  History of gout, swollen and painful knee for the past 3 months.  Procedure: Ultrasound-guided left knee aspiration: After sterile prep with Betadine, injected 3 cc 1% lidocaine without epinephrine, then using an 18-gauge needle guided the needle into the superior joint recess but unable to aspirate any fluid.  The synovium is severely thickened and there is probable tophaceous material in the joint.  Lidocaine and 40 mg methylprednisolone were injected, and the fluid was seen dispersing through the joint.

## 2018-01-31 NOTE — Progress Notes (Signed)
Office Visit Note   Patient: Wayne [name]           Date of Birth: 23-Jun-1960           MRN: [id number] Visit Date: 01/31/2018              Requested by: Clent Demark, PA-C [place], [place] [address] PCP: Clent Demark, PA-C   Assessment & Plan: Visit Diagnoses:  1. Unilateral primary osteoarthritis, left knee     Plan: Impression is left knee end-stage degenerative joint disease.  We will try multiple times but were unsuccessful in aspirating the left knee.  Will refer the patient to Dr. Junius Roads for an ultrasound-guided left knee joint aspiration and injection.  He will follow-up with [place] once he has more financial assistance to schedule a left total knee arthroplasty.  Follow-Up Instructions: Return if symptoms worsen or fail to improve.   Orders:  Orders Placed This Encounter  Procedures  . Large Joint Inj: L knee   No orders of the defined types were placed in this encounter.     Procedures: Large Joint Inj: L knee on 01/31/2018 9:57 AM Indications: pain Details: 22 G needle, anterolateral approach Medications: 2 mL lidocaine 1 %; 2 mL bupivacaine 0.25 %; 40 mg methylPREDNISolone acetate 40 MG/ML      Clinical Data: No additional findings.   Subjective: Chief Complaint  Patient presents with  . Left Knee - Pain    HPI patient is a pleasant 58 year old gentleman who presents to our clinic today with recurrent left knee pain.  History of end-stage renal joint disease.  This is been ongoing for the past several years and has recently worsened.  Pain he has to the entire knee.  He describes this as a constant ache worse with any movement.  He has been ambulating with crutches secondary to the pain.  He has pain all day and all night.  He has tried Tylenol arthritis with minimal relief of symptoms.  No fevers or chills.  He has had a cortisone injection in the past with mild relief of symptoms.  He has been waiting on financial assistance  before proceeding with total knee arthroplasty.  Review of Systems as detailed in HPI.  All others reviewed and are negative.   Objective: Vital Signs: There were no vitals taken for this visit.  Physical Exam well-developed well-nourished gentleman in no acute distress.  Alert and oriented x3.  Ortho Exam examination of the left knee reveals a 2+ effusion.  Range of motion 10 to 95 degrees secondary to pain.  Medial and lateral joint line tenderness.  He is neurovascular intact distally.  Specialty Comments:  No specialty comments available.  Imaging: No new imaging   PMFS History: Patient Active Problem List   Diagnosis Date Noted  . 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 Essential hypertension 08/12/2017  . Unilateral primary osteoarthritis, left knee 05/25/2016  . Unilateral primary osteoarthritis, right knee 05/25/2016   Past Medical History:  Diagnosis Date  . Gout   . High cholesterol   . Hypertension     History reviewed. No pertinent family history.  Past Surgical History:  Procedure Laterality Date  . KNEE ARTHROSCOPY    . LUMBAR DISC SURGERY     Social History   Occupational History  . Not on file  Tobacco Use  . Smoking status: Current Every Day Smoker    Packs/day: 1.00  Types: Cigarettes  . Smokeless tobacco: Never Used  Substance and Sexual Activity  . Alcohol use: Yes    Alcohol/week: 14.0 standard drinks    Types: 14 Cans of beer per week  . Drug use: No  . Sexual activity: Not on file

## 2018-02-04 ENCOUNTER — Ambulatory Visit (INDEPENDENT_AMBULATORY_CARE_PROVIDER_SITE_OTHER): Payer: Self-pay | Admitting: Family Medicine

## 2018-02-12 ENCOUNTER — Telehealth: Payer: Self-pay | Admitting: Physician Assistant

## 2018-02-12 NOTE — Telephone Encounter (Signed)
Patient would like to speak with you regarding some documentation in regards to the 2018 tax form and his IRS denial letter. Please follow up.

## 2018-02-12 NOTE — Telephone Encounter (Signed)
Spoke with Pt, he is requesting a copy of the Non filling 2018 and tax from wife, papers will be at the [facility]

## 2018-02-25 ENCOUNTER — Ambulatory Visit (INDEPENDENT_AMBULATORY_CARE_PROVIDER_SITE_OTHER): Payer: Self-pay | Admitting: Family Medicine

## 2018-02-25 ENCOUNTER — Encounter (INDEPENDENT_AMBULATORY_CARE_PROVIDER_SITE_OTHER): Payer: Self-pay | Admitting: Family Medicine

## 2018-02-25 ENCOUNTER — Other Ambulatory Visit: Payer: Self-pay

## 2018-02-25 VITALS — BP 168/93 | HR 67 | Temp 98.1°F | Ht 74.0 in | Wt 231.4 lb

## 2018-02-25 DIAGNOSIS — G8929 Other chronic pain: Secondary | ICD-10-CM

## 2018-02-25 DIAGNOSIS — R269 Unspecified abnormalities of gait and mobility: Secondary | ICD-10-CM

## 2018-02-25 DIAGNOSIS — M25562 Pain in left knee: Secondary | ICD-10-CM

## 2018-02-25 DIAGNOSIS — M1712 Unilateral primary osteoarthritis, left knee: Secondary | ICD-10-CM

## 2018-02-25 DIAGNOSIS — M109 Gout, unspecified: Secondary | ICD-10-CM

## 2018-02-25 MED ORDER — HYDROCODONE-IBUPROFEN 5-200 MG PO TABS: 1.0000 | ORAL_TABLET | Freq: Three times a day (TID) | ORAL | 0 refills | Status: DC | PRN

## 2018-02-25 MED ORDER — HYDROCODONE-IBUPROFEN 5-200 MG PO TABS: 1.0000 | ORAL_TABLET | Freq: Three times a day (TID) | ORAL | 0 refills | Status: AC | PRN

## 2018-02-25 MED ORDER — PREDNISONE 20 MG PO TABS: ORAL_TABLET | ORAL | 0 refills | Status: DC

## 2018-02-25 MED FILL — predniSONE 20 MG TABS: 20 | 6 days supply | Qty: 10 | Fill #0

## 2018-02-25 NOTE — Progress Notes (Signed)
Subjective:    Patient ID: Wayne [name], male    DOB: 06-10-1960, 58 y.o.   MRN: [id number]  HPI       58 yo male with primary osteoarthritis of the left knee who is status post recent visit on 01/31/2018 with his orthopedic doctor for ultrasound-guided knee aspiration and injection.  Patient has a history of gout as well and has had pain and swelling in his left knee knee for the 3 months prior to his orthopedic visit.  Per his note from orthopedics, left knee has end-stage degenerative joint disease and total left knee arthroplasty is recommended.  Orthopedic doctor was unable to successfully aspirate the left knee and is referring patient to another physician, Dr. Junius Roads for ultrasound-guided left knee joint aspiration.      Patient reports continued pain and swelling in his knee.  Patient states that the injection did not really help.  Patient states that the orthopedic doctor was unable to remove any fluid but could see the gouty crystals within his knee.  Patient states that he believes that he has tried colchicine at least twice since he initially developed onset of knee pain and swelling.  Patient states that he was told to discontinue his use of allopurinol.  Patient states that his knee pain is sharp, stabbing and throbbing and ranges from a 8 to greater than 10 on a 0-to-10 scale.  Patient is using crutches to help walk as he cannot really bear weight on the left leg/knee due to the pain.  Patient continues to have swelling and increased warmth in the left knee.  Patient would like a prescription for pain medication.  Patient states that he has tried tramadol and Tylenol 3 in the past and these did not significantly reduce his pain.      Patient reports he did not take his blood pressure medication today.  Patient states that there is another blood pressure medication that he has that gives him headaches and patient therefore has not been taking that medication but he cannot recall the name of  it but he will look at the bottle and call back to the clinic with the name of the medication.  Patient denies any headaches or dizziness since stopping that particular blood pressure medication within the past 2 to 3 weeks.  Past Medical History:  Diagnosis Date  . Gout   . High cholesterol   . Hypertension    Past Surgical History:  Procedure Laterality Date  . KNEE ARTHROSCOPY    . LUMBAR DISC SURGERY     Social History   Tobacco Use  . Smoking status: Current Every Day Smoker    Packs/day: 1.00    Types: Cigarettes  . Smokeless tobacco: Never Used  Substance Use Topics  . Alcohol use: Yes    Alcohol/week: 14.0 standard drinks    Types: 14 Cans of beer per week  . Drug use: No   Allergies  Allergen Reactions  . Hydralazine Other (See Comments)    Headache      Review of Systems  Constitutional: Positive for fatigue. Negative for chills and fever.  Respiratory: Negative for cough and shortness of breath.   Cardiovascular: Negative for chest pain, palpitations and leg swelling.  Gastrointestinal: Negative for abdominal pain, blood in stool, constipation and diarrhea.  Genitourinary: Negative for dysuria and flank pain.  Musculoskeletal: Positive for arthralgias, gait problem and joint swelling.  Neurological: Positive for headaches (with one of his blood pressure medications). Negative  for dizziness and numbness.  Hematological: Negative for adenopathy. Does not bruise/bleed easily.       Objective:   Physical Exam BP (!) 168/93 (BP Location: Left Arm, Patient Position: Sitting, Cuff Size: Normal)   Pulse 67   Temp 98.1 F (36.7 C) (Oral)   Ht 6\' 2"  (1.88 m)   Wt 231 lb 6.4 oz (105 kg)   SpO2 98%   BMI 29.71 kg/m  nurses notes and vital signs reviewed-patient reports that he did not take his blood pressure medication today General-well-nourished, well-developed male in no acute distress while sitting in exam chair.  Patient has crutches leaned against the  nearby wall. Lungs-clear to auscultation bilaterally Cardiovascular-regular rate and rhythm Abdomen-soft, nontender Extremities- no distal lower extremity edema Musculoskeletal- patient with enlargement of the left knee joint with visible effusion/swelling of the left knee.  Patient with increased warmth with palpation of the knee as well as tenderness which is most acute in the left upper medial knee.  Patient with limited ability to extend or flex the knee secondary to pain as well as swelling of the knee.        Assessment & Plan:  1. Unilateral primary osteoarthritis, left knee Patient's recent note from orthopedics reviewed.  Patient with end-stage degenerative joint disease of the left knee and knee replacement has been recommended.  Patient also had a joint injection but aspiration was unsuccessful.  Patient will be provided with prescription for hydrocodone ibuprofen to take 1 every 8 hours as needed for up to 5 days for acute pain related to his gouty arthritis.  Patient will likely need medication such as Mobic to help with chronic knee pain. Patient was most recently prescribed hydrocodone-apap 10/325mg  on review of chart.  - hydrocodone-ibuprofen (VICOPROFEN) 5-200 MG tablet; Take 1 tablet by mouth every 8 (eight) hours as needed for up to 5 days for pain. Take after eating  Dispense: 15 tablet; Refill: 0  2. Gouty arthritis of left knee Patient with gouty arthritis of the left knee.  Patient will have uric acid level to help determine effectiveness of therapy. He will be placed on a prednisone taper and hydrocodone-ibuprofen for pain as well as restart the use of colchicine 0.6 mg to take 2 pills x 1 then hour up to 4 pills -he is aware that he will have onset of diarrhea and possible abdominal pain/nausea related to the use of colchicine and should stop treatment when this occurs but hopefully this will help to relieve some of the pain and decrease crystal burden associated with gouty  arthritis.  Patient should drink plenty of fluids and remain well-hydrated.   - Uric Acid - predniSONE (DELTASONE) 20 MG tablet; Take 3 pills today then 2 pills daily x2 days, 1 pill daily x2 days then half pill daily x2 days  Dispense: 10 tablet; Refill: 0 - hydrocodone-ibuprofen (VICOPROFEN) 5-200 MG tablet; Take 1 tablet by mouth every 8 (eight) hours as needed for up to 5 days for pain. Take after eating  Dispense: 15 tablet; Refill: 0  3. Gait abnormality Patient with the continued use of crutches to help with mobility limitations secondary to knee pain associated with his degenerative joint disease and gout  4. Chronic pain of left knee Patient will eventually need knee replacement but in the interim, hopefully his gouty arthritis pain will improve with the use of colchicine and prednisone and patient is to restart the use of allopurinol.  Patient may then need a chronic medication such  as Mobic to help with the knee pain.  *Patient is to call back with the name of the medication for his blood pressure which he states causes him to have headaches.  Based on review of his medications, this is likely Imdur.  Patient also with elevated blood pressure today's visit but states he had not taken his blood pressure medication today.  Patient is encouraged to restart his blood pressure medication and return in approximately 2 weeks for nurse visit for recheck of blood pressure.  An After Visit Summary was printed and given to the patient.  Return in about 2 weeks (around 03/11/2018), or if symptoms worsen or fail to improve, for needs 2 week NV for BP check.

## 2018-02-25 NOTE — Patient Instructions (Signed)
Gout  Gout is painful swelling of your joints. Gout is a type of arthritis. It is caused by having too much uric acid in your body. Uric acid is a chemical that is made when your body breaks down substances called purines. If your body has too much uric acid, sharp crystals can form and build up in your joints. This causes pain and swelling. Gout attacks can happen quickly and be very painful (acute gout). Over time, the attacks can affect more joints and happen more often (chronic gout). What are the causes?  Too much uric acid in your blood. This can happen because: ? Your kidneys do not remove enough uric acid from your blood. ? Your body makes too much uric acid. ? You eat too many foods that are high in purines. These foods include organ meats, some seafood, and beer.  Trauma or stress. What increases the risk?  Having a family history of gout.  Being male and middle-aged.  Being male and having gone through menopause.  Being very overweight (obese).  Drinking alcohol, especially beer.  Not having enough water in the body (being dehydrated).  Losing weight too quickly.  Having an organ transplant.  Having lead poisoning.  Taking certain medicines.  Having kidney disease.  Having a skin condition called psoriasis. What are the signs or symptoms? An attack of acute gout usually happens in just one joint. The most common place is the big toe. Attacks often start at night. Other joints that may be affected include joints of the feet, ankle, knee, fingers, wrist, or elbow. Symptoms of an attack may include:  Very bad pain.  Warmth.  Swelling.  Stiffness.  Shiny, red, or purple skin.  Tenderness. The affected joint may be very painful to touch.  Chills and fever. Chronic gout may cause symptoms more often. More joints may be involved. You may also have white or yellow lumps (tophi) on your hands or feet or in other areas near your joints. How is this  treated?  Treatment for this condition has two phases: treating an acute attack and preventing future attacks.  Acute gout treatment may include: ? NSAIDs. ? Steroids. These are taken by mouth or injected into a joint. ? Colchicine. This medicine relieves pain and swelling. It can be given by mouth or through an IV tube.  Preventive treatment may include: ? Taking small doses of NSAIDs or colchicine daily. ? Using a medicine that reduces uric acid levels in your blood. ? Making changes to your diet. You may need to see a food expert (dietitian) about what to eat and drink to prevent gout. Follow these instructions at home: During a gout attack   If told, put ice on the painful area: ? Put ice in a plastic bag. ? Place a towel between your skin and the bag. ? Leave the ice on for 20 minutes, 2-3 times a day.  Raise (elevate) the painful joint above the level of your heart as often as you can.  Rest the joint as much as possible. If the joint is in your leg, you may be given crutches.  Follow instructions from your doctor about what you cannot eat or drink. Avoiding future gout attacks  Eat a low-purine diet. Avoid foods and drinks such as: ? Liver. ? Kidney. ? Anchovies. ? Asparagus. ? Herring. ? Mushrooms. ? Mussels. ? Beer.  Stay at a healthy weight. If you want to lose weight, talk with your doctor. Do not lose weight   drink.  Avoiding future gout attacks   Eat a low-purine diet. Avoid foods and drinks such as:  ? Liver.  ? Kidney.  ? Anchovies.  ? Asparagus.  ? Herring.  ? Mushrooms.  ? Mussels.  ? Beer.   Stay at a healthy weight. If you want to lose weight, talk with your doctor. Do not lose weight too fast.   Start or continue an exercise plan as told by your doctor.  Eating and drinking   Drink enough fluids to keep your pee (urine) pale yellow.   If you drink alcohol:  ? Limit how much you use to:   0-1 drink a day for women.   0-2 drinks a day for men.  ? Be aware of how much alcohol is in your drink. In the [place] one drink equals one 12 oz bottle of beer (355 mL), one 5 oz glass of wine (148 mL), or one 1 oz glass of hard liquor (44 mL).  General instructions   Take over-the-counter and prescription medicines only as told by your doctor.   Do  not drive or use heavy machinery while taking prescription pain medicine.   Return to your normal activities as told by your doctor. Ask your doctor what activities are safe for you.   Keep all follow-up visits as told by your doctor. This is important.  Contact a doctor if:   You have another gout attack.   You still have symptoms of a gout attack after 10 days of treatment.   You have problems (side effects) because of your medicines.   You have chills or a fever.   You have burning pain when you pee (urinate).   You have pain in your lower back or belly.  Get help right away if:   You have very bad pain.   Your pain cannot be controlled.   You cannot pee.  Summary   Gout is painful swelling of the joints.   The most common site of pain is the big toe, but it can affect other joints.   Medicines and avoiding some foods can help to prevent and treat gout attacks.  This information is not intended to replace advice given to you by your health care provider. Make sure you discuss any questions you have with your health care provider.  Document Released: 10/18/2007 Document Revised: 07/31/2017 Document Reviewed: 07/31/2017  Elsevier Interactive Patient Education  2019 Elsevier Inc.    Low-Purine Eating Plan  A low-purine eating plan involves making food choices to limit your intake of purine. Purine is a kind of uric acid. Too much uric acid in your blood can cause certain conditions, such as gout and kidney stones. Eating a low-purine diet can help control these conditions.  What are tips for following this plan?  Reading food labels     Avoid foods with saturated or Trans fat.   Check the ingredient list of grains-based foods, such as bread and cereal, to make sure that they contain whole grains.   Check the ingredient list of sauces or soups to make sure they do not contain meat or fish.   When choosing soft drinks, check the ingredient list to make sure they do not contain high-fructose corn  syrup.  Shopping   Buy plenty of fresh fruits and vegetables.   Avoid buying canned or fresh fish.   Buy dairy products labeled as low-fat or nonfat.   Avoid buying premade or processed foods. These foods are often   high in fat, salt (sodium), and added sugar.  Cooking   Use olive oil instead of butter when cooking. Oils like olive oil, canola oil, and sunflower oil contain healthy fats.  Meal planning   Learn which foods do or do not affect you. If you find out that a food tends to cause your gout symptoms to flare up, avoid eating that food. You can enjoy foods that do not cause problems. If you have any questions about a food item, talk with your dietitian or health care provider.   Limit foods high in fat, especially saturated fat. Fat makes it harder for your body to get rid of uric acid.   Choose foods that are lower in fat and are lean sources of protein.  General guidelines   Limit alcohol intake to no more than 1 drink a day for nonpregnant women and 2 drinks a day for men. One drink equals 12 oz of beer, 5 oz of wine, or 1 oz of hard liquor. Alcohol can affect the way your body gets rid of uric acid.   Drink plenty of water to keep your urine clear or pale yellow. Fluids can help remove uric acid from your body.   If directed by your health care provider, take a vitamin C supplement.   Work with your health care provider and dietitian to develop a plan to achieve or maintain a healthy weight. Losing weight can help reduce uric acid in your blood.  What foods are recommended?  The items listed may not be a complete list. Talk with your dietitian about what dietary choices are best for you.  Foods low in purines  Foods low in purines do not need to be limited. These include:   All fruits.   All low-purine vegetables, pickles, and olives.   Breads, pasta, rice, cornbread, and popcorn. Cake and other baked goods.   All dairy foods.   Eggs, nuts, and nut butters.   Spices and condiments, such  as salt, herbs, and vinegar.   Plant oils, butter, and margarine.   Water, sugar-free soft drinks, tea, coffee, and cocoa.   Vegetable-based soups, broths, sauces, and gravies.  Foods moderate in purines  Foods moderate in purines should be limited to the amounts listed.    cup of asparagus, cauliflower, spinach, mushrooms, or green peas, each day.   2/3 cup uncooked oatmeal, each day.    cup dry wheat bran or wheat germ, each day.   2-3 ounces of meat or poultry, each day.   4-6 ounces of shellfish, such as crab, lobster, oysters, or shrimp, each day.   1 cup cooked beans, peas, or lentils, each day.   Soup, broths, or bouillon made from meat or fish. Limit these foods as much as possible.  What foods are not recommended?  The items listed may not be a complete list. Talk with your dietitian about what dietary choices are best for you.  Limit your intake of foods high in purines, including:   Beer and other alcohol.   Meat-based gravy or sauce.   Canned or fresh fish, such as:  ? Anchovies, sardines, herring, and tuna.  ? Mussels and scallops.  ? Codfish, trout, and haddock.   Bacon.   Organ meats, such as:  ? Liver or kidney.  ? Tripe.  ? Sweetbreads (thymus gland or pancreas).   Wild game or goose.   Yeast or yeast extract supplements.   Drinks sweetened with high-fructose corn syrup.  Summary  

## 2018-02-26 LAB — URIC ACID: Uric Acid: 5.5 mg/dL (ref 3.7–8.6)

## 2018-02-28 ENCOUNTER — Telehealth (INDEPENDENT_AMBULATORY_CARE_PROVIDER_SITE_OTHER): Payer: Self-pay

## 2018-02-28 NOTE — Telephone Encounter (Signed)
Patient is aware that uric acid level is normal and to follow up at [facility] if knee pain does not improve. Nat Christen, CMA

## 2018-02-28 NOTE — Telephone Encounter (Signed)
-----   Message from Antony Blackbird, MD sent at 02/27/2018 11:36 PM EST ----- Notify patient that his uric acid level was normal. Please have patient follow-up at [facility] if his knee pain has not improved

## 2018-03-03 ENCOUNTER — Other Ambulatory Visit: Payer: Self-pay | Admitting: Family Medicine

## 2018-03-03 ENCOUNTER — Telehealth (INDEPENDENT_AMBULATORY_CARE_PROVIDER_SITE_OTHER): Payer: Self-pay | Admitting: Physician Assistant

## 2018-03-03 DIAGNOSIS — M25569 Pain in unspecified knee: Secondary | ICD-10-CM

## 2018-03-03 MED ORDER — HYDROCODONE-IBUPROFEN 5-200 MG PO TABS: 1.0000 | ORAL_TABLET | Freq: Three times a day (TID) | ORAL | 0 refills | Status: DC | PRN

## 2018-03-03 MED ORDER — HYDROCODONE-ACETAMINOPHEN 5-325 MG PO TABS: 1.0000 | ORAL_TABLET | Freq: Three times a day (TID) | ORAL | 0 refills | Status: DC

## 2018-03-03 NOTE — Telephone Encounter (Signed)
Patient has already check price at [facility]. Where he can use a good rx card and get the prescription for about 20 dollars. Please send Rx to [facility]. Nat Christen, CMA

## 2018-03-03 NOTE — Progress Notes (Signed)
Patient ID: Wayne [name], male   DOB: [birth year], 58 y.o.   MRN: [id number]   Call received that patient still wishes to have the Vicoprofen filled but at [facility] where he can use good Rx card to get the medication cheaper.  New prescription will be sent to [facility] and prescription at [facility] for hydrocodone APAP will be canceled.

## 2018-03-03 NOTE — Telephone Encounter (Signed)
Patient called in regard to his pain medication hydrocodone-ibuprofen (VICOPROFEN) 5-200 MG tablet states that PCP sent the Rx to [facility] and can not afford to pay $54. Patient states he would like for the Rx to be sent to   [facility] [place]  Please advice [phone]  Thank you Emmit Pomfret

## 2018-03-03 NOTE — Telephone Encounter (Signed)
I think that the price may be the same at either pharmacy. I can send in hydrocodone/APAP to [facility] which may be cheaper than the vicoprofen if you will let patient know

## 2018-03-03 NOTE — Telephone Encounter (Signed)
Medication (vicoprofen) sent to [facility] on [address]

## 2018-03-03 NOTE — Telephone Encounter (Signed)
Please send to [facility] [address], CMA

## 2018-03-03 NOTE — Progress Notes (Signed)
Patient ID: Wayne [name], male   DOB: 03/02/1960, 58 y.o.   MRN: [id number]   Message received that patient could not afford Vicoprofen RX therefore cancel vicoprofen and will send in RX for hydrocodone-acet 5/325 mg and patient is to follow-up with PCP/Orthopedics

## 2018-03-04 NOTE — Telephone Encounter (Signed)
Patient is aware that Rx has been sent to [place]. Nat Christen, CMA

## 2018-03-17 ENCOUNTER — Ambulatory Visit (INDEPENDENT_AMBULATORY_CARE_PROVIDER_SITE_OTHER): Payer: Self-pay

## 2018-04-14 MED FILL — LOSARTAN POTASSIUM 50 MG TA: 50 | 30 days supply | Qty: 30 | Fill #1

## 2018-04-14 MED FILL — METOPROLOL SUCCINATE ER 25: 25 | 30 days supply | Qty: 30 | Fill #1

## 2018-04-14 MED FILL — ?ALLOPURINOL 300 MG TABLET: 300 | 90 days supply | Qty: 90 | Fill #1

## 2018-05-07 MED FILL — LOSARTAN POTASSIUM 50 MG TA: 50 | 30 days supply | Qty: 30 | Fill #2

## 2018-05-07 MED FILL — METOPROLOL SUCCINATE ER 25: 25 | 30 days supply | Qty: 30 | Fill #2

## 2018-05-08 MED FILL — !COLCRYS 0.6 MG TABLET: 0.6 MG | 9 days supply | Qty: 10 | Fill #2

## 2018-06-02 ENCOUNTER — Telehealth: Payer: Self-pay | Admitting: Orthopaedic Surgery

## 2018-06-02 NOTE — Telephone Encounter (Signed)
Returned call to patient left message to call back to schedule an appointment with Dr Erlinda Hong for Bil knee pain [phone]

## 2018-06-04 ENCOUNTER — Ambulatory Visit (INDEPENDENT_AMBULATORY_CARE_PROVIDER_SITE_OTHER): Payer: Medicaid Other

## 2018-06-04 ENCOUNTER — Encounter: Payer: Self-pay | Admitting: Orthopaedic Surgery

## 2018-06-04 ENCOUNTER — Ambulatory Visit: Payer: Medicaid Other

## 2018-06-04 ENCOUNTER — Ambulatory Visit (INDEPENDENT_AMBULATORY_CARE_PROVIDER_SITE_OTHER): Payer: Medicaid Other | Admitting: Orthopaedic Surgery

## 2018-06-04 ENCOUNTER — Other Ambulatory Visit: Payer: Self-pay

## 2018-06-04 DIAGNOSIS — M1712 Unilateral primary osteoarthritis, left knee: Secondary | ICD-10-CM | POA: Diagnosis not present

## 2018-06-04 DIAGNOSIS — M1711 Unilateral primary osteoarthritis, right knee: Secondary | ICD-10-CM | POA: Diagnosis not present

## 2018-06-04 MED ORDER — BUPIVACAINE HCL 0.25 % IJ SOLN
0.6600 mL | INTRAMUSCULAR | Status: AC | PRN
  Administered 2018-06-04: 15:00:00 .66 mL via INTRA_ARTICULAR

## 2018-06-04 MED ORDER — METHYLPREDNISOLONE ACETATE 40 MG/ML IJ SUSP
13.3300 mg | INTRAMUSCULAR | Status: AC | PRN
  Administered 2018-06-04: 13.33 mg via INTRA_ARTICULAR

## 2018-06-04 MED ORDER — LIDOCAINE HCL 1 % IJ SOLN
3.0000 mL | INTRAMUSCULAR | Status: AC | PRN
  Administered 2018-06-04: 15:00:00 3 mL

## 2018-06-04 MED ORDER — BUPIVACAINE HCL 0.25 % IJ SOLN
0.6600 mL | INTRAMUSCULAR | Status: AC | PRN
  Administered 2018-06-04: .66 mL via INTRA_ARTICULAR

## 2018-06-04 MED ORDER — METHYLPREDNISOLONE ACETATE 40 MG/ML IJ SUSP
13.3300 mg | INTRAMUSCULAR | Status: AC | PRN
  Administered 2018-06-04: 15:00:00 13.33 mg via INTRA_ARTICULAR

## 2018-06-04 NOTE — Progress Notes (Signed)
Office Visit Note   Patient: Wayne [name]           Date of Birth: 06-Jan-1961           MRN: [id number] Visit Date: 06/04/2018              Requested by: Clent Demark, PA-C No address on file PCP: Clent Demark, PA-C   Assessment & Plan: Visit Diagnoses:  1. Unilateral primary osteoarthritis, left knee   2. Unilateral primary osteoarthritis, right knee     Plan: Impression is end-stage of joint disease both knees.  We will inject both knees with cortisone today.  We also discussed Visco supplementation injections.  He will let us know how he responds to the cortisone before obtaining approval for gel injection.  Follow-up with [place] as needed.  This patient is diagnosed with osteoarthritis of the knee(s).    Radiographs show evidence of joint space narrowing, osteophytes, subchondral sclerosis and/or subchondral cysts.  This patient has knee pain which interferes with functional and activities of daily living.    This patient has experienced inadequate response, adverse effects and/or intolerance with conservative treatments such as acetaminophen, NSAIDS, topical creams, physical therapy or regular exercise, knee bracing and/or weight loss.   This patient has experienced inadequate response or has a contraindication to intra articular steroid injections for at least 3 months.   This patient is not scheduled to have a total knee replacement within 6 months of starting treatment with viscosupplementation.   Follow-Up Instructions: Return if symptoms worsen or fail to improve.   Orders:  Orders Placed This Encounter  Procedures  . Large Joint Inj: bilateral knee  . XR KNEE 3 VIEW LEFT  . XR KNEE 3 VIEW RIGHT   No orders of the defined types were placed in this encounter.     Procedures: Large Joint Inj: bilateral knee on 06/04/2018 3:00 PM Indications: pain Details: 22 G needle, anterolateral approach Medications (Right): 0.66 mL bupivacaine 0.25 %; 3 mL  lidocaine 1 %; 13.33 mg methylPREDNISolone acetate 40 MG/ML Medications (Left): 0.66 mL bupivacaine 0.25 %; 3 mL lidocaine 1 %; 13.33 mg methylPREDNISolone acetate 40 MG/ML      Clinical Data: No additional findings.   Subjective: Chief Complaint  Patient presents with  . Right Knee - Pain  . Left Knee - Pain    HPI patient is a pleasant 58 year old gentleman who presents our clinic today with recurrent bilateral knee pain left greater than right.  History of degenerative joint disease both knees.  He was seen in our office in January of this past year for the left knee where it was injected with cortisone.  He noticed moderate relief of symptoms until a few days ago.  No new injury or change in activity.  The right knee was last injected in June 2019.  Good relief of symptoms until recently.  In regards to both knees, the pain is worse with ambulation.  He gets minimal relief at rest.  He has been taking over-the-counter anti-inflammatories with mild relief of symptoms.  He does note that he needs bilateral sequential total knee replacements, but notes that his daughter is graduating at some point this summer and does not want a miss it.  He would like repeat cortisone injections today.  Review of Systems as detailed in HPI.  All others reviewed and are negative.   Objective: Vital Signs: There were no vitals taken for this visit.  Physical Exam well-developed and well-nourished  gentleman in no acute distress.  Alert and oriented x3.  Ortho Exam examination of the left knee shows a 1+ effusion.  Range of motion 5 to 110 degrees.  Medial joint line tenderness.  Marked full femoral crepitus.  Is neurovascular intact distally.  Right knee exam shows trace effusion.  Range of motion 0 to 120 degrees.  No joint line tenderness.  Moderate patellofemoral crepitus.  Is neurovascular intact distally.  Specialty Comments:  No specialty comments available.  Imaging: Xr Knee 3 View Left   Result Date: 06/04/2018 X-rays demonstrate significant tricompartmental degenerative changes  Xr Knee 3 View Right  Result Date: 06/04/2018 X-rays show significant medial and patellofemoral compartment changes    PMFS History: Patient Active Problem List   Diagnosis Date Noted  . 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 Essential hypertension 08/12/2017  . Unilateral primary osteoarthritis, left knee 05/25/2016  . Unilateral primary osteoarthritis, right knee 05/25/2016   Past Medical History:  Diagnosis Date  . Gout   . High cholesterol   . Hypertension     History reviewed. No pertinent family history.  Past Surgical History:  Procedure Laterality Date  . KNEE ARTHROSCOPY    . LUMBAR DISC SURGERY     Social History   Occupational History  . Not on file  Tobacco Use  . Smoking status: Current Every Day Smoker    Packs/day: 1.00    Types: Cigarettes  . Smokeless tobacco: Never Used  Substance and Sexual Activity  . Alcohol use: Yes    Alcohol/week: 14.0 standard drinks    Types: 14 Cans of beer per week  . Drug use: No  . Sexual activity: Not on file

## 2018-06-10 ENCOUNTER — Telehealth: Payer: Self-pay | Admitting: Orthopaedic Surgery

## 2018-06-10 NOTE — Telephone Encounter (Signed)
Pt called to say his inj are not working and he would like to see about getting the gel injection

## 2018-06-11 ENCOUNTER — Ambulatory Visit: Payer: Medicaid Other | Admitting: Primary Care

## 2018-06-11 NOTE — Telephone Encounter (Signed)
Please submit gel injection for patient. Thank you.

## 2018-06-11 NOTE — Telephone Encounter (Signed)
yes

## 2018-06-11 NOTE — Telephone Encounter (Signed)
Did you mean to send to April instead of me?

## 2018-06-11 NOTE — Telephone Encounter (Signed)
Okay to put in for gel injection?

## 2018-06-12 NOTE — Telephone Encounter (Signed)
Please submit gel injection

## 2018-06-12 NOTE — Telephone Encounter (Signed)
Noted  

## 2018-06-12 NOTE — Telephone Encounter (Signed)
Will submit for [employer] Patient Assistance due to patient having Medicaid.

## 2018-06-23 ENCOUNTER — Telehealth (INDEPENDENT_AMBULATORY_CARE_PROVIDER_SITE_OTHER): Payer: Self-pay

## 2018-06-23 NOTE — Telephone Encounter (Signed)
Patient called stating that his allopurinol is not helping with his gout. He is wanting something else prescribed. Please advise. Nat Christen, CMA

## 2018-06-24 MED FILL — LOSARTAN POTASSIUM 50 MG TA: 50 | 30 days supply | Qty: 30 | Fill #3

## 2018-06-24 MED FILL — METOPROLOL SUCCINATE ER 25: 25 | 30 days supply | Qty: 30 | Fill #3

## 2018-06-25 NOTE — Telephone Encounter (Signed)
Last uric acid normal

## 2018-06-26 ENCOUNTER — Telehealth: Payer: Self-pay

## 2018-06-26 NOTE — Telephone Encounter (Signed)
Mailed [employer] application for patient to complete and return for Monovisc, bilateral knee.

## 2018-07-01 NOTE — Telephone Encounter (Signed)
Patient is aware that last uric acid was normal, no other medication prescribed. Nat Christen, CMA

## 2018-07-30 ENCOUNTER — Telehealth: Payer: Self-pay | Admitting: Orthopaedic Surgery

## 2018-07-30 NOTE — Telephone Encounter (Signed)
Is it ok to submit?

## 2018-07-30 NOTE — Telephone Encounter (Signed)
sure

## 2018-07-30 NOTE — Telephone Encounter (Signed)
Patient called advised he is ready to get the gel injection in both knees. The number to contact patient is [phone]

## 2018-07-30 NOTE — Telephone Encounter (Signed)
Request gel injections

## 2018-08-01 NOTE — Telephone Encounter (Signed)
[employer] patient assistance application was mailed out to patient on 06/26/2018, but patient stated that he has not received anything.  Advised patient that I would mail out another copy of the [employer] application for him to complete and return to the office for gel injection.  Patient stated that he understands.

## 2018-08-01 NOTE — Telephone Encounter (Signed)
[employer] application for gel injection has been put at the front desk to be mailed out.

## 2018-08-15 ENCOUNTER — Telehealth: Payer: Self-pay

## 2018-08-15 NOTE — Telephone Encounter (Signed)
Received completed [employer] application from patient on 08/13/2018.  Faxed completed [employer] application to [phone].

## 2018-08-25 ENCOUNTER — Telehealth: Payer: Self-pay | Admitting: Orthopaedic Surgery

## 2018-08-25 NOTE — Telephone Encounter (Signed)
Patient called and left vm in regards to some papers and how long will it take, Unaware of the papers he was speaking in regards to. Call him back [facility] and advised to call us back asap so we may assist him @ [phone]

## 2018-10-06 ENCOUNTER — Telehealth: Payer: Self-pay | Admitting: Orthopaedic Surgery

## 2018-10-06 NOTE — Telephone Encounter (Signed)
Patient called asked for a call back concerning the gel injection. The number to contact patient is [phone]

## 2018-10-06 NOTE — Telephone Encounter (Signed)
Talked with patient concerning gel injection through [employer] Patient assistance.  Per rep with [employer] Medicaid information needed to be faxed.  Faxed medicaid letter to [employer] at [phone].

## 2018-10-08 ENCOUNTER — Telehealth: Payer: Self-pay

## 2018-10-08 NOTE — Telephone Encounter (Signed)
Received PRF form from [employer]. Faxed completed PRF form to [employer] at [phone].

## 2018-10-21 ENCOUNTER — Telehealth: Payer: Self-pay

## 2018-10-21 NOTE — Telephone Encounter (Signed)
Talked with patient and advised him that we received gel injections from [employer].  Approved for Monovisc, bilateral knee. Purchased through [employer] Patient Assistance No Co-pay  Appt. 10/24/2018 with Dr. Erlinda Hong

## 2018-10-24 ENCOUNTER — Ambulatory Visit (INDEPENDENT_AMBULATORY_CARE_PROVIDER_SITE_OTHER): Payer: Medicaid Other | Admitting: Orthopaedic Surgery

## 2018-10-24 ENCOUNTER — Encounter: Payer: Self-pay | Admitting: Orthopaedic Surgery

## 2018-10-24 ENCOUNTER — Other Ambulatory Visit: Payer: Self-pay

## 2018-10-24 DIAGNOSIS — M170 Bilateral primary osteoarthritis of knee: Secondary | ICD-10-CM

## 2018-10-24 MED ORDER — LIDOCAINE HCL 1 % IJ SOLN
3.0000 mL | INTRAMUSCULAR | Status: AC | PRN
  Administered 2018-10-24: 3 mL

## 2018-10-24 MED ORDER — HYALURONAN 88 MG/4ML IX SOSY
88.0000 mg | PREFILLED_SYRINGE | INTRA_ARTICULAR | Status: AC | PRN
  Administered 2018-10-24: 88 mg via INTRA_ARTICULAR

## 2018-10-24 MED ORDER — BUPIVACAINE HCL 0.25 % IJ SOLN
0.6600 mL | INTRAMUSCULAR | Status: AC | PRN
  Administered 2018-10-24: .66 mL via INTRA_ARTICULAR

## 2018-10-24 NOTE — Progress Notes (Signed)
   Procedure Note  Patient: Wayne [name]             Date of Birth: 1960/04/28           MRN: [id number]             Visit Date: 10/24/2018  Procedures: Visit Diagnoses:  1. Bilateral primary osteoarthritis of knee     Large Joint Inj: bilateral knee on 10/24/2018 10:42 AM Indications: pain Details: 22 G needle, anterolateral approach Medications (Right): 0.66 mL bupivacaine 0.25 %; 88 mg Hyaluronan 88 MG/4ML; 3 mL lidocaine 1 % Medications (Left): 0.66 mL bupivacaine 0.25 %; 88 mg Hyaluronan 88 MG/4ML; 3 mL lidocaine 1 %

## 2019-03-13 ENCOUNTER — Telehealth: Payer: Self-pay | Admitting: Orthopaedic Surgery

## 2019-03-13 NOTE — Telephone Encounter (Signed)
We can do cortisone in the mean time if he wants

## 2019-03-13 NOTE — Telephone Encounter (Signed)
Patient called.   He wanted to know if it's been long enough to receive another gel injection in both knees  Call back: [phone]

## 2019-03-13 NOTE — Telephone Encounter (Signed)
Called patient at 4:30 pm and informed him of this information. Said he would rather wait until he's eligible for the gel injection.

## 2019-03-13 NOTE — Telephone Encounter (Signed)
Last set of gel injections was given 10/24/2018. Too early to get another set of gel injections. What else would you recommend in the mean time?

## 2019-03-13 NOTE — Telephone Encounter (Signed)
Can't have gel injections yet, but he can get cortisone injection if he wants

## 2019-05-15 ENCOUNTER — Ambulatory Visit (INDEPENDENT_AMBULATORY_CARE_PROVIDER_SITE_OTHER): Payer: Medicaid Other | Admitting: Primary Care

## 2019-05-20 ENCOUNTER — Encounter (INDEPENDENT_AMBULATORY_CARE_PROVIDER_SITE_OTHER): Payer: Self-pay | Admitting: Primary Care

## 2019-05-20 ENCOUNTER — Other Ambulatory Visit: Payer: Self-pay

## 2019-05-20 ENCOUNTER — Ambulatory Visit (INDEPENDENT_AMBULATORY_CARE_PROVIDER_SITE_OTHER): Payer: Medicaid Other | Admitting: Primary Care

## 2019-05-20 VITALS — BP 169/99 | HR 72 | Temp 97.3°F | Ht 75.0 in | Wt 253.4 lb

## 2019-05-20 DIAGNOSIS — Z8739 Personal history of other diseases of the musculoskeletal system and connective tissue: Secondary | ICD-10-CM | POA: Diagnosis not present

## 2019-05-20 DIAGNOSIS — M25462 Effusion, left knee: Secondary | ICD-10-CM | POA: Diagnosis not present

## 2019-05-20 DIAGNOSIS — I10 Essential (primary) hypertension: Secondary | ICD-10-CM

## 2019-05-20 DIAGNOSIS — E782 Mixed hyperlipidemia: Secondary | ICD-10-CM | POA: Diagnosis not present

## 2019-05-20 MED ORDER — AMLODIPINE BESYLATE 10 MG PO TABS: 10.0000 mg | ORAL_TABLET | Freq: Every day | ORAL | 1 refills | Status: DC

## 2019-05-20 MED ORDER — ALLOPURINOL 300 MG PO TABS: 300.0000 mg | ORAL_TABLET | Freq: Every day | ORAL | 1 refills | Status: DC

## 2019-05-20 MED ORDER — LOSARTAN POTASSIUM 50 MG PO TABS: 50.0000 mg | ORAL_TABLET | Freq: Every day | ORAL | 1 refills | Status: DC

## 2019-05-20 NOTE — Patient Instructions (Signed)
   Managing Your Hypertension Hypertension is commonly called high blood pressure. This is when the force of your blood pressing against the walls of your arteries is too strong. Arteries are blood vessels that carry blood from your heart throughout your body. Hypertension forces the heart to work harder to pump blood, and may cause the arteries to become narrow or stiff. Having untreated or uncontrolled hypertension can cause heart attack, stroke, kidney disease, and other problems. What are blood pressure readings? A blood pressure reading consists of a higher number over a lower number. Ideally, your blood pressure should be below 120/80. The first ("top") number is called the systolic pressure. It is a measure of the pressure in your arteries as your heart beats. The second ("bottom") number is called the diastolic pressure. It is a measure of the pressure in your arteries as the heart relaxes. What does my blood pressure reading mean? Blood pressure is classified into four stages. Based on your blood pressure reading, your health care provider may use the following stages to determine what type of treatment you need, if any. Systolic pressure and diastolic pressure are measured in a unit called mm Hg. Normal  Systolic pressure: below 120.  Diastolic pressure: below 80. Elevated  Systolic pressure: 120-129.  Diastolic pressure: below 80. Hypertension stage 1  Systolic pressure: 130-139.  Diastolic pressure: 80-89. Hypertension stage 2  Systolic pressure: 140 or above.  Diastolic pressure: 90 or above. What health risks are associated with hypertension? Managing your hypertension is an important responsibility. Uncontrolled hypertension can lead to:  A heart attack.  A stroke.  A weakened blood vessel (aneurysm).  Heart failure.  Kidney damage.  Eye damage.  Metabolic syndrome.  Memory and concentration problems. What changes can I make to manage my  hypertension? Hypertension can be managed by making lifestyle changes and possibly by taking medicines. Your health care provider will help you make a plan to bring your blood pressure within a normal range. Eating and drinking   Eat a diet that is high in fiber and potassium, and low in salt (sodium), added sugar, and fat. An example eating plan is called the DASH (Dietary Approaches to Stop Hypertension) diet. To eat this way: ? Eat plenty of fresh fruits and vegetables. Try to fill half of your plate at each meal with fruits and vegetables. ? Eat whole grains, such as whole wheat pasta, brown rice, or whole grain bread. Fill about one quarter of your plate with whole grains. ? Eat low-fat diary products. ? Avoid fatty cuts of meat, processed or cured meats, and poultry with skin. Fill about one quarter of your plate with lean proteins such as fish, chicken without skin, beans, eggs, and tofu. ? Avoid premade and processed foods. These tend to be higher in sodium, added sugar, and fat.  Reduce your daily sodium intake. Most people with hypertension should eat less than 1,500 mg of sodium a day.  Limit alcohol intake to no more than 1 drink a day for nonpregnant women and 2 drinks a day for men. One drink equals 12 oz of beer, 5 oz of wine, or 1 oz of hard liquor. Lifestyle  Work with your health care provider to maintain a healthy body weight, or to lose weight. Ask what an ideal weight is for you.  Get at least 30 minutes of exercise that causes your heart to beat faster (aerobic exercise) most days of the week. Activities may include walking, swimming, or biking.    Include exercise to strengthen your muscles (resistance exercise), such as weight lifting, as part of your weekly exercise routine. Try to do these types of exercises for 30 minutes at least 3 days a week.  Do not use any products that contain nicotine or tobacco, such as cigarettes and e-cigarettes. If you need help quitting,  ask your health care provider.  Control any long-term (chronic) conditions you have, such as high cholesterol or diabetes. Monitoring  Monitor your blood pressure at home as told by your health care provider. Your personal target blood pressure may vary depending on your medical conditions, your age, and other factors.  Have your blood pressure checked regularly, as often as told by your health care provider. Working with your health care provider  Review all the medicines you take with your health care provider because there may be side effects or interactions.  Talk with your health care provider about your diet, exercise habits, and other lifestyle factors that may be contributing to hypertension.  Visit your health care provider regularly. Your health care provider can help you create and adjust your plan for managing hypertension. Will I need medicine to control my blood pressure? Your health care provider may prescribe medicine if lifestyle changes are not enough to get your blood pressure under control, and if:  Your systolic blood pressure is 130 or higher.  Your diastolic blood pressure is 80 or higher. Take medicines only as told by your health care provider. Follow the directions carefully. Blood pressure medicines must be taken as prescribed. The medicine does not work as well when you skip doses. Skipping doses also puts you at risk for problems. Contact a health care provider if:  You think you are having a reaction to medicines you have taken.  You have repeated (recurrent) headaches.  You feel dizzy.  You have swelling in your ankles.  You have trouble with your vision. Get help right away if:  You develop a severe headache or confusion.  You have unusual weakness or numbness, or you feel faint.  You have severe pain in your chest or abdomen.  You vomit repeatedly.  You have trouble breathing. Summary  Hypertension is when the force of blood pumping  through your arteries is too strong. If this condition is not controlled, it may put you at risk for serious complications.  Your personal target blood pressure may vary depending on your medical conditions, your age, and other factors. For most people, a normal blood pressure is less than 120/80.  Hypertension is managed by lifestyle changes, medicines, or both. Lifestyle changes include weight loss, eating a healthy, low-sodium diet, exercising more, and limiting alcohol. This information is not intended to replace advice given to you by your health care provider. Make sure you discuss any questions you have with your health care provider. Document Revised: 05/02/2018 Document Reviewed: 12/07/2015 Elsevier Patient Education  2020 [employer]  

## 2019-05-20 NOTE — Progress Notes (Signed)
Established Patient Office Visit  Subjective:  Patient ID: Wayne [name], male    DOB: 1960/09/02  Age: 59 y.o. MRN: [id number]  CC:  Chief Complaint  Patient presents with  . Hypertension  . Gout    HPI Wayne [name] is a 59 year old African-American male who presents today for management of hypertension- Denies shortness of breath, headaches, chest pain or lower extremity edema, sudden onset, vision changes, unilateral weakness, dizziness, paresthesias in complains of gout.  Recently seen in urgent care per patient given colchicine which improved signs and symptoms.  He also is being followed by DrNaiping. Xu, for orthopedics bilateral arthritis of knees.  Past Medical History:  Diagnosis Date  . Gout   . High cholesterol   . Hypertension     Past Surgical History:  Procedure Laterality Date  . KNEE ARTHROSCOPY    . LUMBAR DISC SURGERY      History reviewed. No pertinent family history.  Social History   Socioeconomic History  . Marital status: Married    Spouse name: Not on file  . Number of children: Not on file  . Years of education: Not on file  . Highest education level: Not on file  Occupational History  . Not on file  Tobacco Use  . Smoking status: Current Every Day Smoker    Packs/day: 1.00    Types: Cigarettes  . Smokeless tobacco: Never Used  Substance and Sexual Activity  . Alcohol use: Yes    Alcohol/week: 14.0 standard drinks    Types: 14 Cans of beer per week  . Drug use: No  . Sexual activity: Not on file  Other Topics Concern  . Not on file  Social History Narrative  . Not on file   Social Determinants of Health   Financial Resource Strain:   . Difficulty of Paying Living Expenses:   Food Insecurity:   . Worried About Charity fundraiser in the Last Year:   . Arboriculturist in the Last Year:   Transportation Needs:   . Film/video editor (Medical):   Marland Kitchen Lack of Transportation (Non-Medical):   Physical Activity:   .  Days of Exercise per Week:   . Minutes of Exercise per Session:   Stress:   . Feeling of Stress :   Social Connections:   . Frequency of Communication with Friends and Family:   . Frequency of Social Gatherings with Friends and Family:   . Attends Religious Services:   . Active Member of Clubs or Organizations:   . Attends Archivist Meetings:   Marland Kitchen Marital Status:   Intimate Partner Violence:   . Fear of Current or Ex-Partner:   . Emotionally Abused:   Marland Kitchen Physically Abused:   . Sexually Abused:     Outpatient Medications Prior to Visit  Medication Sig Dispense Refill  . aspirin EC 81 MG tablet Take 1 tablet (81 mg total) by mouth daily. 90 tablet 3  . isosorbide mononitrate (IMDUR) 60 MG 24 hr tablet Take 1 tablet (60 mg total) by mouth daily. 30 tablet 5  . metoprolol succinate (TOPROL-XL) 25 MG 24 hr tablet Take 1 tablet (25 mg total) by mouth daily. 30 tablet 5  . allopurinol (ZYLOPRIM) 300 MG tablet Take 1 tablet (300 mg total) by mouth daily. (Patient not taking: Reported on 05/20/2019) 30 tablet 6  . amLODipine (NORVASC) 10 MG tablet Take 1 tablet (10 mg total) by mouth daily. (Patient not taking: Reported  on 05/20/2019) 90 tablet 1  . atorvastatin (LIPITOR) 40 MG tablet Take 1 tablet (40 mg total) by mouth daily. (Patient not taking: Reported on 05/20/2019) 30 tablet 5  . colchicine 0.6 MG tablet Day one: Take two tablets together, wait one hour, then take one more tablet. Day 2 and thereafter: Take one table daily. Suspend Atorvastatin while taking Colchicine. (Patient not taking: Reported on 05/20/2019) 30 tablet 0  . hydrocodone-ibuprofen (VICOPROFEN) 5-200 MG tablet Take 1 tablet by mouth every 8 (eight) hours as needed for pain. 15 tablet 0  . losartan (COZAAR) 50 MG tablet Take 1 tablet (50 mg total) by mouth daily. (Patient not taking: Reported on 05/20/2019) 30 tablet 5  . predniSONE (DELTASONE) 20 MG tablet Take 3 pills today then 2 pills daily x2 days, 1 pill daily x2  days then half pill daily x2 days 10 tablet 0  . zolpidem (AMBIEN) 5 MG tablet Take 1 tablet (5 mg total) by mouth at bedtime as needed for sleep. 15 tablet 1   No facility-administered medications prior to visit.    Allergies  Allergen Reactions  . Hydralazine Other (See Comments)    Headache    ROS Review of Systems  All other systems reviewed and are negative.     Objective:    Physical Exam  Constitutional: He is oriented to person, place, and time. He appears well-developed and well-nourished.  HENT:  Head: Normocephalic.  Eyes: Pupils are equal, round, and reactive to light. EOM are normal.  Cardiovascular: Normal rate and regular rhythm.  Pulmonary/Chest: Effort normal and breath sounds normal.  Abdominal: Soft. Bowel sounds are normal.  Musculoskeletal:        General: Normal range of motion.     Cervical back: Normal range of motion and neck supple.  Neurological: He is alert and oriented to person, place, and time. He has normal reflexes.  Skin: Skin is warm and dry.  Psychiatric: He has a normal mood and affect. His behavior is normal. Judgment and thought content normal.    BP (!) 169/99 (BP Location: Right Arm, Patient Position: Sitting, Cuff Size: Large)   Pulse 72   Temp (!) 97.3 F (36.3 C) (Temporal)   Ht _0  (1.905 m)   Wt 253 lb 6.4 oz (114.9 kg)   SpO2 100%   BMI 31.67 kg/m  Wt Readings from Last 3 Encounters:  05/20/19 253 lb 6.4 oz (114.9 kg)  02/25/18 231 lb 6.4 oz (105 kg)  01/12/18 244 lb 0.8 oz (110.7 kg)     Health Maintenance Due  Topic Date Due  . COVID-19 Vaccine (1) Never done  . Fecal DNA (Cologuard)  Never done    There are no preventive care reminders to display for this patient.  No results found for: TSH Lab Results  Component Value Date   WBC 6.9 05/22/2019   HGB 15.0 05/22/2019   HCT 44.4 05/22/2019   MCV 91 05/22/2019   PLT 266 05/22/2019   Lab Results  Component Value Date   NA 144 05/22/2019   K 4.5  05/22/2019   CO2 22 05/22/2019   GLUCOSE 105 (H) 05/22/2019   BUN 10 05/22/2019   CREATININE 1.08 05/22/2019   BILITOT 0.3 05/22/2019   ALKPHOS 108 05/22/2019   AST 17 05/22/2019   ALT 16 05/22/2019   PROT 7.0 05/22/2019   ALBUMIN 4.2 05/22/2019   CALCIUM 9.3 05/22/2019   Lab Results  Component Value Date   CHOL 198 05/22/2019  Lab Results  Component Value Date   HDL 42 05/22/2019   Lab Results  Component Value Date   LDLCALC 132 (H) 05/22/2019   Lab Results  Component Value Date   TRIG 133 05/22/2019   Lab Results  Component Value Date   CHOLHDL 4.7 05/22/2019   No results found for: HGBA1C    Assessment & Plan:  Sandy was seen today for hypertension and gout.  Diagnoses and all orders for this visit:  Mixed hyperlipidemia -     Lipid panel; Future  Essential hypertension Counseled on blood pressure goal of less than 130/80, low-sodium, DASH diet, medication compliance, 150 minutes of moderate intensity exercise per week. Discussed medication compliance, adverse effects. Continue  -     losartan (COZAAR) 50 MG tablet; Take 1 tablet (50 mg total) by mouth daily. -     amLODipine (NORVASC) 10 MG tablet; Take 1 tablet (10 mg total) by mouth daily. -     CMP14+EGFR; Future -     CBC with Differential/Platelet; Future  Effusion of left knee  Left knee pain work on losing weight to help reduce joint pain. May alternate with heat and ice application for pain relief. May also alternate with acetaminophen and Ibuprofen as prescribed pain relief. Other alternatives include massage, acupuncture and water aerobics.  You must stay active and avoid a sedentary lifestyle. -     allopurinol (ZYLOPRIM) 300 MG tablet; Take 1 tablet (300 mg total) by mouth daily.  History of gout Gout is an inflammatory arthritis related to a hyperuricemia.  Decrease foods in purines  Such as red meats and seafoods, alcohol smoking, and obesity greater than 30 -     allopurinol (ZYLOPRIM)  300 MG tablet; Take 1 tablet (300 mg total) by mouth daily. -     Uric Acid; Future    Meds ordered this encounter  Medications  . losartan (COZAAR) 50 MG tablet    Sig: Take 1 tablet (50 mg total) by mouth daily.    Dispense:  90 tablet    Refill:  1  . amLODipine (NORVASC) 10 MG tablet    Sig: Take 1 tablet (10 mg total) by mouth daily.    Dispense:  90 tablet    Refill:  1    Follow-up: Return in about 3 weeks (around 06/10/2019) for  fasting labs Bp in person separate appt.    [name], NP

## 2019-05-22 ENCOUNTER — Other Ambulatory Visit (INDEPENDENT_AMBULATORY_CARE_PROVIDER_SITE_OTHER): Payer: Medicaid Other

## 2019-05-22 ENCOUNTER — Other Ambulatory Visit: Payer: Self-pay

## 2019-05-22 DIAGNOSIS — Z8739 Personal history of other diseases of the musculoskeletal system and connective tissue: Secondary | ICD-10-CM | POA: Diagnosis not present

## 2019-05-22 DIAGNOSIS — E782 Mixed hyperlipidemia: Secondary | ICD-10-CM

## 2019-05-22 DIAGNOSIS — I10 Essential (primary) hypertension: Secondary | ICD-10-CM

## 2019-05-23 LAB — CBC WITH DIFFERENTIAL/PLATELET
Basophils Absolute: 0.1 10*3/uL (ref 0.0–0.2)
Basos: 1 %
EOS (ABSOLUTE): 0.1 10*3/uL (ref 0.0–0.4)
Eos: 2 %
Hematocrit: 44.4 % (ref 37.5–51.0)
Hemoglobin: 15 g/dL (ref 13.0–17.7)
Immature Grans (Abs): 0 10*3/uL (ref 0.0–0.1)
Immature Granulocytes: 0 %
Lymphocytes Absolute: 3.2 10*3/uL — ABNORMAL HIGH (ref 0.7–3.1)
Lymphs: 46 %
MCH: 30.9 pg (ref 26.6–33.0)
MCHC: 33.8 g/dL (ref 31.5–35.7)
MCV: 91 fL (ref 79–97)
Monocytes Absolute: 0.5 10*3/uL (ref 0.1–0.9)
Monocytes: 7 %
Neutrophils Absolute: 3 10*3/uL (ref 1.4–7.0)
Neutrophils: 44 %
Platelets: 266 10*3/uL (ref 150–450)
RBC: 4.86 x10E6/uL (ref 4.14–5.80)
RDW: 13.5 % (ref 11.6–15.4)
WBC: 6.9 10*3/uL (ref 3.4–10.8)

## 2019-05-23 LAB — CMP14+EGFR
ALT: 16 IU/L (ref 0–44)
AST: 17 IU/L (ref 0–40)
Albumin/Globulin Ratio: 1.5 (ref 1.2–2.2)
Albumin: 4.2 g/dL (ref 3.8–4.9)
Alkaline Phosphatase: 108 IU/L (ref 39–117)
BUN/Creatinine Ratio: 9 (ref 9–20)
BUN: 10 mg/dL (ref 6–24)
Bilirubin Total: 0.3 mg/dL (ref 0.0–1.2)
CO2: 22 mmol/L (ref 20–29)
Calcium: 9.3 mg/dL (ref 8.7–10.2)
Chloride: 106 mmol/L (ref 96–106)
Creatinine, Ser: 1.08 mg/dL (ref 0.76–1.27)
GFR calc Af Amer: 87 mL/min/{1.73_m2} (ref >59)
GFR calc non Af Amer: 75 mL/min/{1.73_m2} (ref >59)
Globulin, Total: 2.8 g/dL (ref 1.5–4.5)
Glucose: 105 mg/dL — ABNORMAL HIGH (ref 65–99)
Potassium: 4.5 mmol/L (ref 3.5–5.2)
Sodium: 144 mmol/L (ref 134–144)
Total Protein: 7 g/dL (ref 6.0–8.5)

## 2019-05-23 LAB — LIPID PANEL
Chol/HDL Ratio: 4.7 ratio (ref 0.0–5.0)
Cholesterol, Total: 198 mg/dL (ref 100–199)
HDL: 42 mg/dL (ref >39)
LDL Chol Calc (NIH): 132 mg/dL — ABNORMAL HIGH (ref 0–99)
Triglycerides: 133 mg/dL (ref 0–149)
VLDL Cholesterol Cal: 24 mg/dL (ref 5–40)

## 2019-05-23 LAB — URIC ACID: Uric Acid: 8 mg/dL (ref 3.8–8.4)

## 2019-05-25 ENCOUNTER — Other Ambulatory Visit (INDEPENDENT_AMBULATORY_CARE_PROVIDER_SITE_OTHER): Payer: Self-pay | Admitting: Primary Care

## 2019-05-25 MED ORDER — ATORVASTATIN CALCIUM 20 MG PO TABS
20.0000 mg | ORAL_TABLET | Freq: Every day | ORAL | 3 refills | Status: DC
Start: 2019-05-25 — End: 2019-10-13

## 2019-05-28 ENCOUNTER — Telehealth: Payer: Self-pay | Admitting: Orthopaedic Surgery

## 2019-05-28 NOTE — Telephone Encounter (Signed)
yes

## 2019-05-28 NOTE — Telephone Encounter (Signed)
Pt called stating he got gel injections last year in both knees and wanted to know if he could make an appt to get cortizone injections instead?  [phone]

## 2019-05-29 NOTE — Telephone Encounter (Signed)
I called patient, appointment scheduled.

## 2019-06-02 ENCOUNTER — Telehealth (INDEPENDENT_AMBULATORY_CARE_PROVIDER_SITE_OTHER): Payer: Self-pay | Admitting: *Deleted

## 2019-06-02 NOTE — Telephone Encounter (Signed)
Medical Clearance for dental treatment failed when patient had visit due to elevated BP readings. BP readings were 182/101 and 175/102.   Patient states he is taking 2 blood pressure medications [id number] and amlodipine.  Has taken them since his last OV. Has no recollection of Metoprolol or isosorbide.   He has an appointment scheduled for May 19. Advised if he experiences chest pain, SOB, HA, dizziness, or blurred vision prior to appt. To go the ED.   Will forward message to PCP to advise otherwise.

## 2019-06-04 ENCOUNTER — Ambulatory Visit: Payer: Self-pay

## 2019-06-04 ENCOUNTER — Encounter: Payer: Self-pay | Admitting: Orthopaedic Surgery

## 2019-06-04 ENCOUNTER — Ambulatory Visit: Payer: Medicaid Other | Admitting: Orthopaedic Surgery

## 2019-06-04 ENCOUNTER — Other Ambulatory Visit: Payer: Self-pay

## 2019-06-04 ENCOUNTER — Ambulatory Visit (INDEPENDENT_AMBULATORY_CARE_PROVIDER_SITE_OTHER): Payer: Medicaid Other

## 2019-06-04 DIAGNOSIS — M25561 Pain in right knee: Secondary | ICD-10-CM | POA: Diagnosis not present

## 2019-06-04 DIAGNOSIS — M25562 Pain in left knee: Secondary | ICD-10-CM | POA: Diagnosis not present

## 2019-06-04 DIAGNOSIS — G8929 Other chronic pain: Secondary | ICD-10-CM

## 2019-06-04 MED ORDER — LIDOCAINE HCL 1 % IJ SOLN
2.0000 mL | INTRAMUSCULAR | Status: AC | PRN
  Administered 2019-06-04: 2 mL

## 2019-06-04 MED ORDER — BUPIVACAINE HCL 0.5 % IJ SOLN
2.0000 mL | INTRAMUSCULAR | Status: AC | PRN
  Administered 2019-06-04: 2 mL via INTRA_ARTICULAR

## 2019-06-04 MED ORDER — METHYLPREDNISOLONE ACETATE 40 MG/ML IJ SUSP
40.0000 mg | INTRAMUSCULAR | Status: AC | PRN
  Administered 2019-06-04: 40 mg via INTRA_ARTICULAR

## 2019-06-04 NOTE — Progress Notes (Signed)
   Office Visit Note   Patient: Wayne [name]           Date of Birth: [birth year]           MRN: [id number] Visit Date: 06/04/2019              Requested by: [name], NP [address] [place]   [id number] PCP: [name], NP   Assessment & Plan: Visit Diagnoses:  1. Chronic pain of both knees     Plan: Bilateral cortisone injections performed today.  Patient tolerated this well.  Follow-up as needed.  Follow-Up Instructions: Return if symptoms worsen or fail to improve.   Orders:  Orders Placed This Encounter  Procedures  . XR KNEE 3 VIEW RIGHT  . XR KNEE 3 VIEW LEFT   No orders of the defined types were placed in this encounter.     Procedures: Large Joint Inj: bilateral knee on 06/04/2019 11:25 AM Indications: pain Details: 22 G needle  Arthrogram: No  Medications (Right): 2 mL lidocaine 1 %; 2 mL bupivacaine 0.5 %; 40 mg methylPREDNISolone acetate 40 MG/ML Medications (Left): 2 mL lidocaine 1 %; 2 mL bupivacaine 0.5 %; 40 mg methylPREDNISolone acetate 40 MG/ML Outcome: tolerated well, no immediate complications Patient was prepped and draped in the usual sterile fashion.       Clinical Data: No additional findings.   Subjective: Chief Complaint  Patient presents with  . Left Knee - Pain  . Right Knee - Pain    Taisean returns today for follow-up of bilateral knee pain.  The left knee is worse.  He did get relief from viscosupplementation but he felt like the injections caused a flareup a couple days afterwards and so he would rather just take with cortisone injections.  No changes in medical history otherwise.   Review of Systems   Objective: Vital Signs: There were no vitals taken for this visit.  Physical Exam  Ortho Exam Knee exams are unchanged. Specialty Comments:  No specialty comments available.  Imaging: XR KNEE 3 VIEW LEFT  Result Date: 06/04/2019 Advanced tricompartmental DJD  XR KNEE 3 VIEW  RIGHT  Result Date: 06/04/2019 Advanced tricompartmental DJD.    PMFS History: Patient Active Problem List   Diagnosis Date Noted  . 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 Essential hypertension 08/12/2017  . Unilateral primary osteoarthritis, left knee 05/25/2016  . Unilateral primary osteoarthritis, right knee 05/25/2016   Past Medical History:  Diagnosis Date  . Gout   . High cholesterol   . Hypertension     History reviewed. No pertinent family history.  Past Surgical History:  Procedure Laterality Date  . KNEE ARTHROSCOPY    . LUMBAR DISC SURGERY     Social History   Occupational History  . Not on file  Tobacco Use  . Smoking status: Current Every Day Smoker    Packs/day: 1.00    Types: Cigarettes  . Smokeless tobacco: Never Used  Substance and Sexual Activity  . Alcohol use: Yes    Alcohol/week: 14.0 standard drinks    Types: 14 Cans of beer per week  . Drug use: No  . Sexual activity: Not on file

## 2019-06-10 ENCOUNTER — Other Ambulatory Visit: Payer: Self-pay

## 2019-06-10 ENCOUNTER — Ambulatory Visit (INDEPENDENT_AMBULATORY_CARE_PROVIDER_SITE_OTHER): Payer: Medicaid Other | Admitting: Primary Care

## 2019-06-10 ENCOUNTER — Encounter (INDEPENDENT_AMBULATORY_CARE_PROVIDER_SITE_OTHER): Payer: Self-pay | Admitting: Primary Care

## 2019-06-10 VITALS — BP 141/90 | HR 68 | Temp 97.3°F | Ht 75.0 in | Wt 253.6 lb

## 2019-06-10 DIAGNOSIS — F17200 Nicotine dependence, unspecified, uncomplicated: Secondary | ICD-10-CM

## 2019-06-10 DIAGNOSIS — M25561 Pain in right knee: Secondary | ICD-10-CM | POA: Diagnosis not present

## 2019-06-10 DIAGNOSIS — Z1211 Encounter for screening for malignant neoplasm of colon: Secondary | ICD-10-CM | POA: Diagnosis not present

## 2019-06-10 DIAGNOSIS — I10 Essential (primary) hypertension: Secondary | ICD-10-CM | POA: Diagnosis not present

## 2019-06-10 DIAGNOSIS — Z125 Encounter for screening for malignant neoplasm of prostate: Secondary | ICD-10-CM | POA: Diagnosis not present

## 2019-06-10 DIAGNOSIS — E782 Mixed hyperlipidemia: Secondary | ICD-10-CM | POA: Diagnosis not present

## 2019-06-10 DIAGNOSIS — G8929 Other chronic pain: Secondary | ICD-10-CM

## 2019-06-10 DIAGNOSIS — M25562 Pain in left knee: Secondary | ICD-10-CM

## 2019-06-10 MED ORDER — HYDROCHLOROTHIAZIDE 25 MG PO TABS: 25.0000 mg | ORAL_TABLET | Freq: Every day | ORAL | 3 refills | Status: DC

## 2019-06-10 NOTE — Progress Notes (Addendum)
Established Patient Office Visit  Subjective:  Patient ID: Wayne [name], male    DOB: December 10, 1960  Age: 59 y.o. MRN: [id number]  CC:  Chief Complaint  Patient presents with  . Hypertension    HPI [name] is a 59 year old African American male presents today for elevated blood pressure. He was to have dental work postpone due to elevated blood pressure. Today Bp remains elevated but not as elevated as in the dentist office 182/101 and 175/102. These reading were taken on 06/02/2019. Denies shortness of breath, headaches, chest pain or lower extremity edema, sudden onset, vision changes, unilateral weakness, dizziness, paresthesias  Past Medical History:  Diagnosis Date  . Gout   . High cholesterol   . Hypertension     Past Surgical History:  Procedure Laterality Date  . KNEE ARTHROSCOPY    . LUMBAR DISC SURGERY      History reviewed. No pertinent family history.  Social History   Socioeconomic History  . Marital status: Married    Spouse name: Not on file  . Number of children: Not on file  . Years of education: Not on file  . Highest education level: Not on file  Occupational History  . Not on file  Tobacco Use  . Smoking status: Current Every Day Smoker    Packs/day: 1.00    Types: Cigarettes  . Smokeless tobacco: Never Used  Substance and Sexual Activity  . Alcohol use: Yes    Alcohol/week: 14.0 standard drinks    Types: 14 Cans of beer per week  . Drug use: No  . Sexual activity: Not on file  Other Topics Concern  . Not on file  Social History Narrative  . Not on file   Social Determinants of Health   Financial Resource Strain:   . Difficulty of Paying Living Expenses:   Food Insecurity:   . Worried About Charity fundraiser in the Last Year:   . Arboriculturist in the Last Year:   Transportation Needs:   . Film/video editor (Medical):   Marland Kitchen Lack of Transportation (Non-Medical):   Physical Activity:   . Days of Exercise per Week:    . Minutes of Exercise per Session:   Stress:   . Feeling of Stress :   Social Connections:   . Frequency of Communication with Friends and Family:   . Frequency of Social Gatherings with Friends and Family:   . Attends Religious Services:   . Active Member of Clubs or Organizations:   . Attends Archivist Meetings:   Marland Kitchen Marital Status:   Intimate Partner Violence:   . Fear of Current or Ex-Partner:   . Emotionally Abused:   Marland Kitchen Physically Abused:   . Sexually Abused:     Outpatient Medications Prior to Visit  Medication Sig Dispense Refill  . allopurinol (ZYLOPRIM) 300 MG tablet Take 1 tablet (300 mg total) by mouth daily. 90 tablet 1  . amLODipine (NORVASC) 10 MG tablet Take 1 tablet (10 mg total) by mouth daily. 90 tablet 1  . atorvastatin (LIPITOR) 20 MG tablet Take 1 tablet (20 mg total) by mouth daily. 90 tablet 3  . losartan (COZAAR) 50 MG tablet Take 1 tablet (50 mg total) by mouth daily. 90 tablet 1  . aspirin EC 81 MG tablet Take 1 tablet (81 mg total) by mouth daily. (Patient not taking: Reported on 06/10/2019) 90 tablet 3  . isosorbide mononitrate (IMDUR) 60 MG 24  hr tablet Take 1 tablet (60 mg total) by mouth daily. 30 tablet 5  . metoprolol succinate (TOPROL-XL) 25 MG 24 hr tablet Take 1 tablet (25 mg total) by mouth daily. 30 tablet 5   No facility-administered medications prior to visit.    Allergies  Allergen Reactions  . Hydralazine Other (See Comments)    Headache    ROS Review of Systems  All other systems reviewed and are negative.     Objective:    Physical Exam  Constitutional: He is oriented to person, place, and time. He appears well-developed and well-nourished.  HENT:  Head: Normocephalic.  Eyes: Pupils are equal, round, and reactive to light. EOM are normal.  Cardiovascular: Normal rate and regular rhythm.  Pulmonary/Chest: Effort normal and breath sounds normal.  Abdominal: Bowel sounds are normal.  Musculoskeletal:         General: Normal range of motion.     Cervical back: Normal range of motion and neck supple.  Neurological: He is oriented to person, place, and time.  Skin: Skin is warm and dry.  Psychiatric: He has a normal mood and affect. His behavior is normal. Judgment and thought content normal.    BP (!) 141/90 (BP Location: Left Arm, Patient Position: Sitting, Cuff Size: Large)   Pulse 68   Temp (!) 97.3 F (36.3 C) (Temporal)   Ht 6\' 3"  (1.905 m)   Wt 253 lb 9.6 oz (115 kg)   SpO2 94%   BMI 31.70 kg/m  Wt Readings from Last 3 Encounters:  06/10/19 253 lb 9.6 oz (115 kg)  05/20/19 253 lb 6.4 oz (114.9 kg)  02/25/18 231 lb 6.4 oz (105 kg)     Health Maintenance Due  Topic Date Due  . COVID-19 Vaccine (1) Never done  . Fecal DNA (Cologuard)  Never done    There are no preventive care reminders to display for this patient.  No results found for: TSH Lab Results  Component Value Date   WBC 6.9 05/22/2019   HGB 15.0 05/22/2019   HCT 44.4 05/22/2019   MCV 91 05/22/2019   PLT 266 05/22/2019   Lab Results  Component Value Date   NA 144 05/22/2019   K 4.5 05/22/2019   CO2 22 05/22/2019   GLUCOSE 105 (H) 05/22/2019   BUN 10 05/22/2019   CREATININE 1.08 05/22/2019   BILITOT 0.3 05/22/2019   ALKPHOS 108 05/22/2019   AST 17 05/22/2019   ALT 16 05/22/2019   PROT 7.0 05/22/2019   ALBUMIN 4.2 05/22/2019   CALCIUM 9.3 05/22/2019   Lab Results  Component Value Date   CHOL 198 05/22/2019   Lab Results  Component Value Date   HDL 42 05/22/2019   Lab Results  Component Value Date   LDLCALC 132 (H) 05/22/2019   Lab Results  Component Value Date   TRIG 133 05/22/2019   Lab Results  Component Value Date   CHOLHDL 4.7 05/22/2019   No results found for: HGBA1C    Assessment & Plan:  Wayne [name] was seen today for hypertension.  Diagnoses and all orders for this visit: Wayne [name] was seen today for hypertension.  Diagnoses and all orders for this visit:  Colon cancer  screening Normal colon cancer screening.  CDC recommends colorectal screening from ages 5-75. -     Ambulatory referral to Gastroenterology  Essential hypertension Uncontrolled on losartan 50mg  and amlodipine 10mg  both take daily and patients states he is compliant with taking his medications. Will add HCTZ 25mg   in the morning to improve Bp readings. Counseled on blood pressure goal of less than 130/80, low-sodium, DASH diet, medication compliance, 150 minutes of moderate intensity exercise per week. Discussed medication compliance, adverse effects. -     hydrochlorothiazide (HYDRODIURIL) 25 MG tablet; Take 1 tablet (25 mg total) by mouth daily.  Chronic pain of both knees Followed by orthopedics Dr. Erlinda Hong   Mixed hyperlipidemia Decrease your fatty foods, red meat, cheese, milk and increase fiber like whole grains and veggies. Continue atorvastatin 20mg  at bedtime until. Lab results reviewed .  Prostate cancer screening Explaine/discussed prostate cancer grows slowly and is initially confined to the prostate gland, where it may not cause serious harm. Recommended age is 2.  [email or web address]  Tobacco use disorder This can also contribute to elevated BP.   Increased risk for lung cancer and other respiratory diseases recommend cessation.  This will be reminded at each clinical visit.    Meds ordered this encounter  Medications  . hydrochlorothiazide (HYDRODIURIL) 25 MG tablet    Sig: Take 1 tablet (25 mg total) by mouth daily.    Dispense:  90 tablet    Refill:  3    Follow-up: Return in about 4 weeks (around 07/08/2019) for Bp follow up in person.    [name], NP

## 2019-06-10 NOTE — Patient Instructions (Signed)

## 2019-06-11 ENCOUNTER — Other Ambulatory Visit (INDEPENDENT_AMBULATORY_CARE_PROVIDER_SITE_OTHER): Payer: Self-pay | Admitting: Primary Care

## 2019-06-11 DIAGNOSIS — R972 Elevated prostate specific antigen [PSA]: Secondary | ICD-10-CM

## 2019-06-11 LAB — CBC WITH DIFFERENTIAL/PLATELET
Basophils Absolute: 0.1 10*3/uL (ref 0.0–0.2)
Basos: 1 %
EOS (ABSOLUTE): 0.1 10*3/uL (ref 0.0–0.4)
Eos: 1 %
Hematocrit: 45.8 % (ref 37.5–51.0)
Hemoglobin: 15.7 g/dL (ref 13.0–17.7)
Immature Grans (Abs): 0 10*3/uL (ref 0.0–0.1)
Immature Granulocytes: 0 %
Lymphocytes Absolute: 3.6 10*3/uL — ABNORMAL HIGH (ref 0.7–3.1)
Lymphs: 42 %
MCH: 30.7 pg (ref 26.6–33.0)
MCHC: 34.3 g/dL (ref 31.5–35.7)
MCV: 90 fL (ref 79–97)
Monocytes Absolute: 0.6 10*3/uL (ref 0.1–0.9)
Monocytes: 7 %
Neutrophils Absolute: 4.2 10*3/uL (ref 1.4–7.0)
Neutrophils: 49 %
Platelets: 320 10*3/uL (ref 150–450)
RBC: 5.11 x10E6/uL (ref 4.14–5.80)
RDW: 13.4 % (ref 11.6–15.4)
WBC: 8.6 10*3/uL (ref 3.4–10.8)

## 2019-06-11 LAB — CMP14+EGFR
ALT: 26 IU/L (ref 0–44)
AST: 21 IU/L (ref 0–40)
Albumin/Globulin Ratio: 1.5 (ref 1.2–2.2)
Albumin: 4.6 g/dL (ref 3.8–4.9)
Alkaline Phosphatase: 114 IU/L (ref 48–121)
BUN/Creatinine Ratio: 12 (ref 9–20)
BUN: 14 mg/dL (ref 6–24)
Bilirubin Total: 0.3 mg/dL (ref 0.0–1.2)
CO2: 23 mmol/L (ref 20–29)
Calcium: 10.1 mg/dL (ref 8.7–10.2)
Chloride: 104 mmol/L (ref 96–106)
Creatinine, Ser: 1.13 mg/dL (ref 0.76–1.27)
GFR calc Af Amer: 82 mL/min/{1.73_m2} (ref >59)
GFR calc non Af Amer: 71 mL/min/{1.73_m2} (ref >59)
Globulin, Total: 3.1 g/dL (ref 1.5–4.5)
Glucose: 98 mg/dL (ref 65–99)
Potassium: 4.9 mmol/L (ref 3.5–5.2)
Sodium: 141 mmol/L (ref 134–144)
Total Protein: 7.7 g/dL (ref 6.0–8.5)

## 2019-06-11 LAB — LIPID PANEL
Chol/HDL Ratio: 3 ratio (ref 0.0–5.0)
Cholesterol, Total: 165 mg/dL (ref 100–199)
HDL: 55 mg/dL (ref >39)
LDL Chol Calc (NIH): 87 mg/dL (ref 0–99)
Triglycerides: 129 mg/dL (ref 0–149)
VLDL Cholesterol Cal: 23 mg/dL (ref 5–40)

## 2019-06-11 LAB — PSA: Prostate Specific Ag, Serum: 113 ng/mL — ABNORMAL HIGH (ref 0.0–4.0)

## 2019-06-12 ENCOUNTER — Telehealth (INDEPENDENT_AMBULATORY_CARE_PROVIDER_SITE_OTHER): Payer: Self-pay

## 2019-06-12 NOTE — Telephone Encounter (Signed)
Patient called stating PCP was supposed to prescribe a medication for his prostate. He contacted the pharmacy and they do not have any prescription. There is no note or documentation in chart. Please advise or send medication if appropriate.  Wayne [name], CMA

## 2019-06-16 ENCOUNTER — Telehealth (INDEPENDENT_AMBULATORY_CARE_PROVIDER_SITE_OTHER): Payer: Self-pay

## 2019-06-16 ENCOUNTER — Other Ambulatory Visit (INDEPENDENT_AMBULATORY_CARE_PROVIDER_SITE_OTHER): Payer: Self-pay | Admitting: Primary Care

## 2019-06-16 MED ORDER — TAMSULOSIN HCL 0.4 MG PO CAPS
0.4000 mg | ORAL_CAPSULE | Freq: Every day | ORAL | 3 refills | Status: DC
Start: 2019-06-16 — End: 2019-10-13

## 2019-06-16 NOTE — Telephone Encounter (Signed)
This is the second message left by patient. Please send medication if appropriate.

## 2019-06-16 NOTE — Telephone Encounter (Signed)
Patient called stating that PCP advice that she would be prescribing some medication for his prostate. Patient went to pharmacy to pick up his medication and was advice PCP did not send any medication to help with his prostate.   Patient uses [facility] on [address]  Please advice [phone]

## 2019-07-13 ENCOUNTER — Other Ambulatory Visit: Payer: Self-pay

## 2019-07-13 ENCOUNTER — Ambulatory Visit (INDEPENDENT_AMBULATORY_CARE_PROVIDER_SITE_OTHER): Payer: Medicaid Other | Admitting: Primary Care

## 2019-07-13 ENCOUNTER — Encounter (INDEPENDENT_AMBULATORY_CARE_PROVIDER_SITE_OTHER): Payer: Self-pay | Admitting: Primary Care

## 2019-07-13 VITALS — BP 122/83 | HR 46 | Temp 98.2°F | Ht 75.0 in | Wt 249.8 lb

## 2019-07-13 DIAGNOSIS — Z125 Encounter for screening for malignant neoplasm of prostate: Secondary | ICD-10-CM | POA: Diagnosis not present

## 2019-07-13 DIAGNOSIS — F17200 Nicotine dependence, unspecified, uncomplicated: Secondary | ICD-10-CM | POA: Diagnosis not present

## 2019-07-13 DIAGNOSIS — R972 Elevated prostate specific antigen [PSA]: Secondary | ICD-10-CM

## 2019-07-13 DIAGNOSIS — Z0130 Encounter for examination of blood pressure without abnormal findings: Secondary | ICD-10-CM

## 2019-07-13 MED ORDER — BUPROPION HCL ER (SR) 150 MG PO TB12: 150.0000 mg | ORAL_TABLET | Freq: Two times a day (BID) | ORAL | 0 refills | Status: DC

## 2019-07-13 NOTE — Progress Notes (Signed)
[place] for  hypertension evaluation, on previous visit medication was adjusted to include HCTZ 27m daily   Patient reports adherence with medications.  Current Medication List Current Outpatient Medications on File Prior to Visit  Medication Sig Dispense Refill  . allopurinol (ZYLOPRIM) 300 MG tablet Take 1 tablet (300 mg total) by mouth daily. 90 tablet 1  . amLODipine (NORVASC) 10 MG tablet Take 1 tablet (10 mg total) by mouth daily. 90 tablet 1  . atorvastatin (LIPITOR) 20 MG tablet Take 1 tablet (20 mg total) by mouth daily. 90 tablet 3  . hydrochlorothiazide (HYDRODIURIL) 25 MG tablet Take 1 tablet (25 mg total) by mouth daily. 90 tablet 3  . losartan (COZAAR) 50 MG tablet Take 1 tablet (50 mg total) by mouth daily. 90 tablet 1  . tamsulosin (FLOMAX) 0.4 MG CAPS capsule Take 1 capsule (0.4 mg total) by mouth daily. 30 capsule 3  . aspirin EC 81 MG tablet Take 1 tablet (81 mg total) by mouth daily. (Patient not taking: Reported on 06/10/2019) 90 tablet 3   No current facility-administered medications on file prior to visit.   Past Medical History  Past Medical History:  Diagnosis Date  . Gout   . High cholesterol   . Hypertension    Dietary habits include: healthy heart diet  Exercise habits include: walking daily for 10- 15 mins Family / Social history:NO  ASCVD risk factors include- CMali O:  Physical Exam Vitals reviewed.  Constitutional:      Appearance: He is obese.  HENT:     Head: Normocephalic.  Cardiovascular:     Pulses: Normal pulses.     Heart sounds: Normal heart sounds.  Pulmonary:     Effort: Pulmonary effort is normal.     Breath sounds: Normal breath sounds.  Abdominal:     General: Bowel sounds are normal.  Musculoskeletal:        General: Normal range of motion.     Cervical back: Normal range of motion and neck supple.  Skin:    General: Skin is warm and dry.  Neurological:     Mental Status: He is alert  and oriented to person, place, and time.  Psychiatric:        Mood and Affect: Mood normal.        Thought Content: Thought content normal.        Judgment: Judgment normal.      Review of Systems  All other systems reviewed and are negative.   Last 3 Office BP readings: BP Readings from Last 3 Encounters:  07/13/19 122/83  06/10/19 (!) 141/90  05/20/19 (!) 169/99    BMET    Component Value Date/Time   NA 141 06/10/2019 1045   K 4.9 06/10/2019 1045   CL 104 06/10/2019 1045   CO2 23 06/10/2019 1045   GLUCOSE 98 06/10/2019 1045   GLUCOSE 107 (H) 07/28/2008 1405   BUN 14 06/10/2019 1045   CREATININE 1.13 06/10/2019 1045   CALCIUM 10.1 06/10/2019 1045   GFRNONAA 71 06/10/2019 1045   GFRAA 82 06/10/2019 1045    Renal function: CrCl cannot be calculated (Patient's most recent lab result is older than the maximum 21 days allowed.).  Clinical ASCVD: Yes  The 10-year ASCVD risk score (Mikey BussingDC Jr., et al., 2013) is: 17.4%   Values used to calculate the score:     Age: [age]years     Sex: Male  Is Non-Hispanic African American: Yes     Diabetic: No     Tobacco smoker: Yes     Systolic Blood Pressure: 520 mmHg     Is BP treated: Yes     HDL Cholesterol: 55 mg/dL     Total Cholesterol: 165 mg/dL   A/P: Blood pressure check Hypertension longstanding/newly diagnosed currently HCTZ 45m, amlodipine 595mand losartan 5017maily and on current medications. BP Goal = 130/80 mmHg. (Met)Patient is adherent with current medications.  -Continued  -F/u labs ordered - none -Counseled on lifestyle modifications for blood pressure control including reduced dietary sodium, increased exercise, adequate sleep RonDeariss seen today for blood pressure check.  Tobacco use disorder He is aware of Increased risk for lung cancer and other respiratory diseases recommend cessation.  This will be reminded at each clinical visit. -     buPROPion (WELLBUTRIN SR) 150 MG 12 hr tablet; Take 1  tablet (150 mg total) by mouth 2 (two) times daily.  Elevated PSA Prostrate Cancer Screening For men aged 59 46 69 4ars, the decision to undergo periodic prostate-specific antigen (PSA)-based screening for prostate cancer PSA future    MicKerin Perna

## 2019-07-13 NOTE — Patient Instructions (Signed)
Bupropion sustained-release tablets (smoking cessation) What is this medicine? BUPROPION (byoo PROE pee on) is used to help people quit smoking. This medicine may be used for other purposes; ask your health care provider or pharmacist if you have questions. COMMON BRAND NAME(S): Buproban, Zyban What should I tell my health care provider before I take this medicine? They need to know if you have any of these conditions:  an eating disorder, such as anorexia or bulimia  bipolar disorder or psychosis  diabetes or high blood sugar, treated with medication  glaucoma  head injury or brain tumor  heart disease, previous heart attack, or irregular heart beat  high blood pressure  kidney or liver disease  seizures  suicidal thoughts or a previous suicide attempt  Tourette's syndrome  weight loss  an unusual or allergic reaction to bupropion, other medicines, foods, dyes, or preservatives  breast-feeding  pregnant or trying to become pregnant How should I use this medicine? Take this medicine by mouth with a glass of water. Follow the directions on the prescription label. You can take it with or without food. If it upsets your stomach, take it with food. Do not cut, crush or chew this medicine. Take your medicine at regular intervals. If you take this medicine more than once a day, take your second dose at least 8 hours after you take your first dose. To limit difficulty in sleeping, avoid taking this medicine at bedtime. Do not take your medicine more often than directed. Do not stop taking this medicine suddenly except upon the advice of your doctor. Stopping this medicine too quickly may cause serious side effects. A special MedGuide will be given to you by the pharmacist with each prescription and refill. Be sure to read this information carefully each time. Talk to your pediatrician regarding the use of this medicine in children. Special care may be needed. Overdosage: If you  think you have taken too much of this medicine contact a poison control center or emergency room at once. NOTE: This medicine is only for you. Do not share this medicine with others. What if I miss a dose? If you miss a dose, skip the missed dose and take your next tablet at the regular time. There should be at least 8 hours between doses. Do not take double or extra doses. What may interact with this medicine? Do not take this medicine with any of the following medications:  linezolid  MAOIs like Azilect, Carbex, Eldepryl, Marplan, Nardil, and Parnate  methylene blue (injected into a vein)  other medicines that contain bupropion like Wellbutrin This medicine may also interact with the following medications:  alcohol  certain medicines for anxiety or sleep  certain medicines for blood pressure like metoprolol, propranolol  certain medicines for depression or psychotic disturbances  certain medicines for HIV or AIDS like efavirenz, lopinavir, nelfinavir, ritonavir  certain medicines for irregular heart beat like propafenone, flecainide  certain medicines for Parkinson's disease like amantadine, levodopa  certain medicines for seizures like carbamazepine, phenytoin, phenobarbital  cimetidine  clopidogrel  cyclophosphamide  digoxin  furazolidone  isoniazid  nicotine  orphenadrine  procarbazine  steroid medicines like prednisone or cortisone  stimulant medicines for attention disorders, weight loss, or to stay awake  tamoxifen  theophylline  thiotepa  ticlopidine  tramadol  warfarin This list may not describe all possible interactions. Give your health care provider a list of all the medicines, herbs, non-prescription drugs, or dietary supplements you use. Also tell them if you smoke,   drink alcohol, or use illegal drugs. Some items may interact with your medicine. What should I watch for while using this medicine? Visit your doctor or healthcare provider  for regular checks on your progress. This medicine should be used together with a patient support program. It is important to participate in a behavioral program, counseling, or other support program that is recommended by your healthcare provider. This medicine may cause serious skin reactions. They can happen weeks to months after starting the medicine. Contact your healthcare provider right away if you notice fevers or flu-like symptoms with a rash. The rash may be red or purple and then turn into blisters or peeling of the skin. Or, you might notice a red rash with swelling of the face, lips or lymph nodes in your neck or under your arms. Patients and their families should watch out for new or worsening thoughts of suicide or depression. Also watch out for sudden changes in feelings such as feeling anxious, agitated, panicky, irritable, hostile, aggressive, impulsive, severely restless, overly excited and hyperactive, or not being able to sleep. If this happens, especially at the beginning of treatment or after a change in dose, call your healthcare provider. Avoid alcoholic drinks while taking this medicine. Drinking excessive alcoholic beverages, using sleeping or anxiety medicines, or quickly stopping the use of these agents while taking this medicine may increase your risk for a seizure. Do not drive or use heavy machinery until you know how this medicine affects you. This medicine can impair your ability to perform these tasks. Do not take this medicine close to bedtime. It may prevent you from sleeping. Your mouth may get dry. Chewing sugarless gum or sucking hard candy, and drinking plenty of water may help. Contact your doctor if the problem does not go away or is severe. Do not use nicotine patches or chewing gum without the advice of your doctor or healthcare provider while taking this medicine. You may need to have your blood pressure taken regularly if your doctor recommends that you use both  nicotine and this medicine together. What side effects may I notice from receiving this medicine? Side effects that you should report to your doctor or health care professional as soon as possible:  allergic reactions like skin rash, itching or hives, swelling of the face, lips, or tongue  breathing problems  changes in vision  confusion  elevated mood, decreased need for sleep, racing thoughts, impulsive behavior  fast or irregular heartbeat  hallucinations, loss of contact with reality  increased blood pressure  rash, fever, and swollen lymph nodes  redness, blistering, peeling, or loosening of the skin, including inside the mouth  seizures  suicidal thoughts or other mood changes  unusually weak or tired  vomiting Side effects that usually do not require medical attention (report to your doctor or health care professional if they continue or are bothersome):  constipation  headache  loss of appetite  nausea  tremors  weight loss This list may not describe all possible side effects. Call your doctor for medical advice about side effects. You may report side effects to FDA at 1-800-FDA-1088. Where should I keep my medicine? Keep out of the reach of children. Store at room temperature between 20 and 25 degrees C (68 and 77 degrees F). Protect from light. Keep container tightly closed. Throw away any unused medicine after the expiration date. NOTE: This sheet is a summary. It may not cover all possible information. If you have questions about this medicine,   talk to your doctor, pharmacist, or health care provider.  2020 Elsevier/Gold Standard (2018-04-03 13:59:09)  

## 2019-08-03 ENCOUNTER — Other Ambulatory Visit: Payer: Self-pay

## 2019-08-03 ENCOUNTER — Ambulatory Visit (INDEPENDENT_AMBULATORY_CARE_PROVIDER_SITE_OTHER): Payer: Medicaid Other | Admitting: Primary Care

## 2019-08-03 ENCOUNTER — Encounter (INDEPENDENT_AMBULATORY_CARE_PROVIDER_SITE_OTHER): Payer: Self-pay | Admitting: Primary Care

## 2019-08-03 VITALS — BP 125/87 | HR 102 | Temp 98.0°F | Ht 75.0 in | Wt 246.0 lb

## 2019-08-03 DIAGNOSIS — Z1211 Encounter for screening for malignant neoplasm of colon: Secondary | ICD-10-CM | POA: Diagnosis not present

## 2019-08-03 DIAGNOSIS — F17200 Nicotine dependence, unspecified, uncomplicated: Secondary | ICD-10-CM

## 2019-08-03 DIAGNOSIS — Z0130 Encounter for examination of blood pressure without abnormal findings: Secondary | ICD-10-CM | POA: Diagnosis not present

## 2019-08-03 NOTE — Progress Notes (Signed)
Established Patient Office Visit  Subjective:  Patient ID: Wayne [name], male    DOB: 03-20-1960  Age: 59 y.o. MRN: [id number]  CC:  Chief Complaint  Patient presents with  . Blood Pressure Check    HPI Wayne [name] is a 59 year old male concerned about his blood pressure being elevated. Why he is concerned is at his last dentist appointment it was canceled due to elevated Bp. Home reading systolic 202-542 and diastolic hovers around the 80's. He should have brought his monitor in to compare. Explain no cigarettes prior to appointment , caffeine drinks take his Bp medication and he should be fine. Past Medical History:  Diagnosis Date  . Gout   . High cholesterol   . Hypertension     Past Surgical History:  Procedure Laterality Date  . KNEE ARTHROSCOPY    . LUMBAR DISC SURGERY      No family history on file.  Social History   Socioeconomic History  . Marital status: Married    Spouse name: Not on file  . Number of children: Not on file  . Years of education: Not on file  . Highest education level: Not on file  Occupational History  . Not on file  Tobacco Use  . Smoking status: Current Every Day Smoker    Packs/day: 1.00    Types: Cigarettes  . Smokeless tobacco: Never Used  Substance and Sexual Activity  . Alcohol use: Yes    Alcohol/week: 14.0 standard drinks    Types: 14 Cans of beer per week  . Drug use: No  . Sexual activity: Not on file  Other Topics Concern  . Not on file  Social History Narrative  . Not on file   Social Determinants of Health   Financial Resource Strain:   . Difficulty of Paying Living Expenses:   Food Insecurity:   . Worried About Charity fundraiser in the Last Year:   . Arboriculturist in the Last Year:   Transportation Needs:   . Film/video editor (Medical):   Marland Kitchen Lack of Transportation (Non-Medical):   Physical Activity:   . Days of Exercise per Week:   . Minutes of Exercise per Session:   Stress:   .  Feeling of Stress :   Social Connections:   . Frequency of Communication with Friends and Family:   . Frequency of Social Gatherings with Friends and Family:   . Attends Religious Services:   . Active Member of Clubs or Organizations:   . Attends Archivist Meetings:   Marland Kitchen Marital Status:   Intimate Partner Violence:   . Fear of Current or Ex-Partner:   . Emotionally Abused:   Marland Kitchen Physically Abused:   . Sexually Abused:     Outpatient Medications Prior to Visit  Medication Sig Dispense Refill  . allopurinol (ZYLOPRIM) 300 MG tablet Take 1 tablet (300 mg total) by mouth daily. 90 tablet 1  . amLODipine (NORVASC) 10 MG tablet Take 1 tablet (10 mg total) by mouth daily. 90 tablet 1  . atorvastatin (LIPITOR) 20 MG tablet Take 1 tablet (20 mg total) by mouth daily. 90 tablet 3  . buPROPion (WELLBUTRIN SR) 150 MG 12 hr tablet Take 1 tablet (150 mg total) by mouth 2 (two) times daily. 180 tablet 0  . hydrochlorothiazide (HYDRODIURIL) 25 MG tablet Take 1 tablet (25 mg total) by mouth daily. 90 tablet 3  . losartan (COZAAR) 50 MG tablet Take  1 tablet (50 mg total) by mouth daily. 90 tablet 1  . tamsulosin (FLOMAX) 0.4 MG CAPS capsule Take 1 capsule (0.4 mg total) by mouth daily. 30 capsule 3  . aspirin EC 81 MG tablet Take 1 tablet (81 mg total) by mouth daily. (Patient not taking: Reported on 06/10/2019) 90 tablet 3   No facility-administered medications prior to visit.    Allergies  Allergen Reactions  . Hydralazine Other (See Comments)    Headache    ROS Review of Systems  HENT: Positive for dental problem.   All other systems reviewed and are negative.     Objective:    Physical Exam Vitals reviewed.  Constitutional:      Appearance: He is obese.  Cardiovascular:     Rate and Rhythm: Normal rate and regular rhythm.  Pulmonary:     Effort: Pulmonary effort is normal.     Breath sounds: Normal breath sounds.  Abdominal:     General: Bowel sounds are normal.   Musculoskeletal:        General: Normal range of motion.     Cervical back: Normal range of motion.  Skin:    General: Skin is warm and dry.  Neurological:     Mental Status: He is alert and oriented to person, place, and time.  Psychiatric:        Mood and Affect: Mood normal.        Thought Content: Thought content normal.        Judgment: Judgment normal.     BP 125/87 (BP Location: Left Arm, Patient Position: Sitting, Cuff Size: Normal)   Pulse (!) 102   Temp 98 F (36.7 C) (Oral)   Ht 6\' 3"  (1.905 m)   Wt 246 lb (111.6 kg)   SpO2 96%   BMI 30.75 kg/m  Wt Readings from Last 3 Encounters:  08/03/19 246 lb (111.6 kg)  07/13/19 249 lb 12.8 oz (113.3 kg)  06/10/19 253 lb 9.6 oz (115 kg)     Health Maintenance Due  Topic Date Due  . COVID-19 Vaccine (1) Never done  . Fecal DNA (Cologuard)  Never done    There are no preventive care reminders to display for this patient.  No results found for: TSH Lab Results  Component Value Date   WBC 8.6 06/10/2019   HGB 15.7 06/10/2019   HCT 45.8 06/10/2019   MCV 90 06/10/2019   PLT 320 06/10/2019   Lab Results  Component Value Date   NA 141 06/10/2019   K 4.9 06/10/2019   CO2 23 06/10/2019   GLUCOSE 98 06/10/2019   BUN 14 06/10/2019   CREATININE 1.13 06/10/2019   BILITOT 0.3 06/10/2019   ALKPHOS 114 06/10/2019   AST 21 06/10/2019   ALT 26 06/10/2019   PROT 7.7 06/10/2019   ALBUMIN 4.6 06/10/2019   CALCIUM 10.1 06/10/2019   Lab Results  Component Value Date   CHOL 165 06/10/2019   Lab Results  Component Value Date   HDL 55 06/10/2019   Lab Results  Component Value Date   LDLCALC 87 06/10/2019   Lab Results  Component Value Date   TRIG 129 06/10/2019   Lab Results  Component Value Date   CHOLHDL 3.0 06/10/2019   No results found for: HGBA1C    Assessment & Plan:  Wayne [name] was seen today for blood pressure check.  Diagnoses and all orders for this visit:  Tobacco use disorder He is very much  aware of the  affect of nicotine every organ in the body second leading cause of death.  Increased risk for lung cancer and other respiratory diseases recommend cessation.  This will be reminded at each clinical visit.  Colon cancer screening Normal colon cancer screening.  CDC recommends colorectal screening from ages 71-75.  -     Ambulatory referral to Gastroenterology  Blood pressure check We have discussed his blood pressure is at goal. I have advised patient to check BP regularly if he is able . We discussed the importance of compliance with medical therapy and DASH diet recommended, consequences of uncontrolled hypertension discussed.  - continue current BP medications  No orders of the defined types were placed in this encounter.   Follow-up: Return for keep schedule appointed .    [name], NP

## 2019-08-03 NOTE — Patient Instructions (Signed)

## 2019-08-06 ENCOUNTER — Encounter: Payer: Self-pay | Admitting: Internal Medicine

## 2019-09-15 ENCOUNTER — Other Ambulatory Visit: Payer: Self-pay

## 2019-09-15 ENCOUNTER — Ambulatory Visit (AMBULATORY_SURGERY_CENTER): Payer: Self-pay | Admitting: *Deleted

## 2019-09-15 VITALS — Ht 75.0 in | Wt 244.0 lb

## 2019-09-15 DIAGNOSIS — Z1211 Encounter for screening for malignant neoplasm of colon: Secondary | ICD-10-CM

## 2019-09-15 DIAGNOSIS — Z01818 Encounter for other preprocedural examination: Secondary | ICD-10-CM

## 2019-09-15 MED ORDER — SUPREP BOWEL PREP KIT 17.5-3.13-1.6 GM/177ML PO SOLN: 1.0000 | Freq: Once | ORAL | 0 refills | Status: AC

## 2019-09-15 NOTE — Progress Notes (Signed)
No egg or soy allergy known to patient  No issues with past sedation with any surgeries or procedures no intubation problems in the past  No FH of Malignant Hyperthermia No diet pills per patient No home 02 use per patient  No blood thinners per patient  Pt denies issues with constipation  No A fib or A flutter  EMMI video to pt or via MyChart  COVID 19 guidelines implemented in [place] today with Pt and RN   Will get 2nd covid vaccine 09-19-19- will not be 14 days from 2nd vaccine - will need cov test    Due to the COVID-19 pandemic we are asking patients to follow these guidelines. Please only bring one care partner. Please be aware that your care partner may wait in the car in the parking lot or if they feel like they will be too hot to wait in the car, they may wait in the lobby on the 4th floor. All care partners are required to wear a mask the entire time (we do not have any that we can provide them), they need to practice social distancing, and we will do a Covid check for all patient's and care partners when you arrive. Also we will check their temperature and your temperature. If the care partner waits in their car they need to stay in the parking lot the entire time and we will call them on their cell phone when the patient is ready for discharge so they can bring the car to the front of the building. Also all patient's will need to wear a mask into building.

## 2019-09-16 ENCOUNTER — Telehealth: Payer: Self-pay | Admitting: Internal Medicine

## 2019-09-16 NOTE — Telephone Encounter (Signed)
Information added to family history.

## 2019-09-29 ENCOUNTER — Other Ambulatory Visit: Payer: Self-pay | Admitting: Internal Medicine

## 2019-09-29 ENCOUNTER — Ambulatory Visit (INDEPENDENT_AMBULATORY_CARE_PROVIDER_SITE_OTHER): Payer: Medicaid Other

## 2019-09-29 DIAGNOSIS — Z1159 Encounter for screening for other viral diseases: Secondary | ICD-10-CM

## 2019-09-29 LAB — SARS CORONAVIRUS 2 (TAT 6-24 HRS): SARS Coronavirus 2: NEGATIVE

## 2019-10-01 ENCOUNTER — Ambulatory Visit (AMBULATORY_SURGERY_CENTER): Payer: Medicaid Other | Admitting: Internal Medicine

## 2019-10-01 ENCOUNTER — Other Ambulatory Visit: Payer: Self-pay

## 2019-10-01 ENCOUNTER — Encounter: Payer: Self-pay | Admitting: Internal Medicine

## 2019-10-01 VITALS — BP 132/66 | HR 58 | Temp 97.8°F | Resp 16 | Ht 75.0 in | Wt 244.0 lb

## 2019-10-01 DIAGNOSIS — Z1211 Encounter for screening for malignant neoplasm of colon: Secondary | ICD-10-CM | POA: Diagnosis not present

## 2019-10-01 DIAGNOSIS — D123 Benign neoplasm of transverse colon: Secondary | ICD-10-CM | POA: Diagnosis not present

## 2019-10-01 DIAGNOSIS — D120 Benign neoplasm of cecum: Secondary | ICD-10-CM | POA: Diagnosis not present

## 2019-10-01 DIAGNOSIS — D122 Benign neoplasm of ascending colon: Secondary | ICD-10-CM

## 2019-10-01 DIAGNOSIS — D125 Benign neoplasm of sigmoid colon: Secondary | ICD-10-CM

## 2019-10-01 MED ORDER — SODIUM CHLORIDE 0.9 % IV SOLN
500.0000 mL | INTRAVENOUS | Status: DC
Start: 2019-10-01 — End: 2019-10-01

## 2019-10-01 NOTE — Progress Notes (Signed)
Vs cW Pt's states no medical or surgical changes since previsit or office visit.

## 2019-10-01 NOTE — Progress Notes (Signed)
Called to room to assist during endoscopic procedure.  Patient ID and intended procedure confirmed with present staff. Received instructions for my participation in the procedure from the performing physician.  

## 2019-10-01 NOTE — Patient Instructions (Signed)
Please read handouts provided. Continue present medications. Await pathology results.   YOU HAD AN ENDOSCOPIC PROCEDURE TODAY AT THE [facility]:   Refer to the procedure report that was given to you for any specific questions about what was found during the examination.  If the procedure report does not answer your questions, please call your gastroenterologist to clarify.  If you requested that your care partner not be given the details of your procedure findings, then the procedure report has been included in a sealed envelope for you to review at your convenience later.  YOU SHOULD EXPECT: Some feelings of bloating in the abdomen. Passage of more gas than usual.  Walking can help get rid of the air that was put into your GI tract during the procedure and reduce the bloating. If you had a lower endoscopy (such as a colonoscopy or flexible sigmoidoscopy) you may notice spotting of blood in your stool or on the toilet paper. If you underwent a bowel prep for your procedure, you may not have a normal bowel movement for a few days.  Please Note:  You might notice some irritation and congestion in your nose or some drainage.  This is from the oxygen used during your procedure.  There is no need for concern and it should clear up in a day or so.  SYMPTOMS TO REPORT IMMEDIATELY:  Following lower endoscopy (colonoscopy or flexible sigmoidoscopy):  Excessive amounts of blood in the stool  Significant tenderness or worsening of abdominal pains  Swelling of the abdomen that is new, acute  Fever of 100F or higher   For urgent or emergent issues, a gastroenterologist can be reached at any hour by calling [phone]. Do not use MyChart messaging for urgent concerns.    DIET:  We do recommend a small meal at first, but then you may proceed to your regular diet.  Drink plenty of fluids but you should avoid alcoholic beverages for 24 hours.  ACTIVITY:  You should plan to take it easy  for the rest of today and you should NOT DRIVE or use heavy machinery until tomorrow (because of the sedation medicines used during the test).    FOLLOW UP: Our staff will call the number listed on your records 48-72 hours following your procedure to check on you and address any questions or concerns that you may have regarding the information given to you following your procedure. If we do not reach you, we will leave a message.  We will attempt to reach you two times.  During this call, we will ask if you have developed any symptoms of COVID 19. If you develop any symptoms (ie: fever, flu-like symptoms, shortness of breath, cough etc.) before then, please call [phone].  If you test positive for Covid 19 in the 2 weeks post procedure, please call and report this information to us.    If any biopsies were taken you will be contacted by phone or by letter within the next 1-3 weeks.  Please call us at [phone] if you have not heard about the biopsies in 3 weeks.    SIGNATURES/CONFIDENTIALITY: You and/or your care partner have signed paperwork which will be entered into your electronic medical record.  These signatures attest to the fact that that the information above on your After Visit Summary has been reviewed and is understood.  Full responsibility of the confidentiality of this discharge information lies with you and/or your care-partner.  

## 2019-10-01 NOTE — Op Note (Signed)
[place] Patient Name: Wayne [name] Procedure Date: 10/01/2019 11:00 AM MRN: [id number] Endoscopist: Jerene Bears , MD Age: 59 Referring MD:  Date of Birth: 1960/11/19 Gender: Male Account #: [id number] Procedure:                Colonoscopy Indications:              Screening for colorectal malignant neoplasm, This                            is the patient's first colonoscopy Medicines:                Monitored Anesthesia Care Procedure:                Pre-Anesthesia Assessment:                           - Prior to the procedure, a History and Physical                            was performed, and patient medications and                            allergies were reviewed. The patient's tolerance of                            previous anesthesia was also reviewed. The risks                            and benefits of the procedure and the sedation                            options and risks were discussed with the patient.                            All questions were answered, and informed consent                            was obtained. Prior Anticoagulants: The patient has                            taken no previous anticoagulant or antiplatelet                            agents. ASA Grade Assessment: II - A patient with                            mild systemic disease. After reviewing the risks                            and benefits, the patient was deemed in                            satisfactory condition to undergo the procedure.  After obtaining informed consent, the colonoscope                            was passed under direct vision. Throughout the                            procedure, the patient's blood pressure, pulse, and                            oxygen saturations were monitored continuously. The                            Colonoscope was introduced through the anus and                            advanced to the cecum,  identified by appendiceal                            orifice and ileocecal valve. The colonoscopy was                            performed without difficulty. The patient tolerated                            the procedure well. The quality of the bowel                            preparation was good. The ileocecal valve,                            appendiceal orifice, and rectum were photographed. Scope In: 11:19:12 AM Scope Out: 11:43:50 AM Scope Withdrawal Time: 0 hours 22 minutes 32 seconds  Total Procedure Duration: 0 hours 24 minutes 38 seconds  Findings:                 The digital rectal exam was normal.                           A 4 mm polyp was found in the cecum. The polyp was                            sessile. The polyp was removed with a cold snare.                            Resection and retrieval were complete.                           Three sessile polyps were found in the ascending                            colon. The polyps were 4 to 7 mm in size. These  polyps were removed with a cold snare. Resection                            and retrieval were complete.                           Two sessile polyps were found in the transverse                            colon. The polyps were 4 and 10 mm in size. These                            polyps were removed with a cold snare. Resection                            and retrieval were complete.                           A 4 mm polyp was found in the sigmoid colon. The                            polyp was sessile. The polyp was removed with a                            cold snare. Resection and retrieval were complete.                           Internal hemorrhoids were found during                            retroflexion. The hemorrhoids were small. Complications:            No immediate complications. Estimated Blood Loss:     Estimated blood loss was minimal. Impression:               - One 4 mm  polyp in the cecum, removed with a cold                            snare. Resected and retrieved.                           - Three 4 to 7 mm polyps in the ascending colon,                            removed with a cold snare. Resected and retrieved.                           - Two 4 and 10 mm polyps in the transverse colon,                            removed with a cold snare. Resected and retrieved.                           -  One 4 mm polyp in the sigmoid colon, removed with                            a cold snare. Resected and retrieved.                           - Small internal hemorrhoids. Recommendation:           - Patient has a contact number available for                            emergencies. The signs and symptoms of potential                            delayed complications were discussed with the                            patient. Return to normal activities tomorrow.                            Written discharge instructions were provided to the                            patient.                           - Resume previous diet.                           - Continue present medications.                           - Await pathology results.                           - Repeat colonoscopy is recommended for                            surveillance. The colonoscopy date will be                            determined after pathology results from today's                            exam become available for review. Jerene Bears, MD 10/01/2019 11:51:21 AM This report has been signed electronically.

## 2019-10-01 NOTE — Progress Notes (Signed)
To PACU, VSS. Report to [email or web address] 

## 2019-10-05 ENCOUNTER — Telehealth: Payer: Self-pay

## 2019-10-05 NOTE — Telephone Encounter (Signed)
Called [phone] and left a message we tried to reach pt for a follow up call. maw

## 2019-10-05 NOTE — Telephone Encounter (Signed)
  Follow up Call-  Call back number 10/01/2019  Post procedure Call Back phone  # [phone]  Permission to leave phone message Yes  Some recent data might be hidden     Patient questions:  Do you have a fever, pain , or abdominal swelling? No. Pain Score  0 *  Have you tolerated food without any problems? Yes.    Have you been able to return to your normal activities? Yes.    Do you have any questions about your discharge instructions: Diet   No. Medications  No. Follow up visit  No.  Do you have questions or concerns about your Care? No.  Actions: * If pain score is 4 or above: No action needed, pain <4.  1. Have you developed a fever since your procedure? no  2.   Have you had an respiratory symptoms (SOB or cough) since your procedure? no  3.   Have you tested positive for COVID 19 since your procedure no  4.   Have you had any family members/close contacts diagnosed with the COVID 19 since your procedure?  no   If yes to any of these questions please route to Joylene John, RN and Joella Prince, RN

## 2019-10-05 NOTE — Telephone Encounter (Signed)
Dialed the number twice in error. maw

## 2019-10-06 ENCOUNTER — Other Ambulatory Visit (INDEPENDENT_AMBULATORY_CARE_PROVIDER_SITE_OTHER): Payer: Self-pay | Admitting: Primary Care

## 2019-10-06 DIAGNOSIS — F17200 Nicotine dependence, unspecified, uncomplicated: Secondary | ICD-10-CM

## 2019-10-06 NOTE — Telephone Encounter (Signed)
Requested Prescriptions  Pending Prescriptions Disp Refills   buPROPion (WELLBUTRIN SR) 150 MG 12 hr tablet [Pharmacy Med Name: BUPROPION SR 150MG  TABLETS (12 H)] 180 tablet 0    Sig: TAKE 1 TABLET(150 MG) BY MOUTH TWICE DAILY     Psychiatry: Antidepressants - bupropion Passed - 10/06/2019 11:16 AM      Passed - Last BP in normal range    BP Readings from Last 1 Encounters:  10/01/19 132/66         Passed - Valid encounter within last 6 months    Recent Outpatient Visits          2 months ago Tobacco use disorder   [place] [name], NP   2 months ago Tobacco use disorder   [place] [name], NP   3 months ago Colon cancer screening   [place], [name] P, NP   4 months ago Mixed hyperlipidemia   [place] [name], NP   1 year ago Unilateral primary osteoarthritis, left knee   [facility] Fulp, Cammie, MD      Future Appointments            In 1 week Oletta Lamas, Milford Cage, NP [place]

## 2019-10-07 ENCOUNTER — Encounter: Payer: Self-pay | Admitting: Internal Medicine

## 2019-10-13 ENCOUNTER — Ambulatory Visit (INDEPENDENT_AMBULATORY_CARE_PROVIDER_SITE_OTHER): Payer: Medicaid Other | Admitting: Primary Care

## 2019-10-13 ENCOUNTER — Other Ambulatory Visit: Payer: Self-pay

## 2019-10-13 ENCOUNTER — Encounter (INDEPENDENT_AMBULATORY_CARE_PROVIDER_SITE_OTHER): Payer: Self-pay | Admitting: Primary Care

## 2019-10-13 VITALS — BP 144/86 | HR 78 | Temp 97.5°F | Ht 75.0 in | Wt 251.2 lb

## 2019-10-13 DIAGNOSIS — Z8739 Personal history of other diseases of the musculoskeletal system and connective tissue: Secondary | ICD-10-CM | POA: Diagnosis not present

## 2019-10-13 DIAGNOSIS — I10 Essential (primary) hypertension: Secondary | ICD-10-CM | POA: Diagnosis not present

## 2019-10-13 DIAGNOSIS — R972 Elevated prostate specific antigen [PSA]: Secondary | ICD-10-CM | POA: Diagnosis not present

## 2019-10-13 MED ORDER — NAPROXEN 500 MG PO TABS: 500.0000 mg | ORAL_TABLET | Freq: Two times a day (BID) | ORAL | 0 refills | Status: DC

## 2019-10-13 MED ORDER — ALLOPURINOL 300 MG PO TABS: 300.0000 mg | ORAL_TABLET | Freq: Every day | ORAL | 1 refills | Status: DC

## 2019-10-13 MED ORDER — LOSARTAN POTASSIUM 100 MG PO TABS: 100.0000 mg | ORAL_TABLET | Freq: Every day | ORAL | 1 refills | Status: DC

## 2019-10-13 MED ORDER — ATORVASTATIN CALCIUM 20 MG PO TABS
20.0000 mg | ORAL_TABLET | Freq: Every day | ORAL | 3 refills | Status: DC
Start: 2019-10-13 — End: 2019-11-23

## 2019-10-13 MED ORDER — HYDROCHLOROTHIAZIDE 25 MG PO TABS: 25.0000 mg | ORAL_TABLET | Freq: Every day | ORAL | 3 refills | Status: DC

## 2019-10-13 MED ORDER — TAMSULOSIN HCL 0.4 MG PO CAPS
0.4000 mg | ORAL_CAPSULE | Freq: Every day | ORAL | 3 refills | Status: DC
Start: 2019-10-13 — End: 2020-04-05

## 2019-10-13 MED ORDER — AMLODIPINE BESYLATE 10 MG PO TABS: 10.0000 mg | ORAL_TABLET | Freq: Every day | ORAL | 1 refills | Status: DC

## 2019-10-13 NOTE — Progress Notes (Signed)
[place] for  hypertension evaluation, on previous visit medication was not adjusted   Patient reports adherence with medications.  Current Medication List Current Outpatient Medications on File Prior to Visit  Medication Sig Dispense Refill   allopurinol (ZYLOPRIM) 300 MG tablet Take 1 tablet (300 mg total) by mouth daily. 90 tablet 1   amLODipine (NORVASC) 10 MG tablet Take 1 tablet (10 mg total) by mouth daily. 90 tablet 1   atorvastatin (LIPITOR) 20 MG tablet Take 1 tablet (20 mg total) by mouth daily. 90 tablet 3   buPROPion (WELLBUTRIN SR) 150 MG 12 hr tablet TAKE 1 TABLET(150 MG) BY MOUTH TWICE DAILY 180 tablet 0   hydrochlorothiazide (HYDRODIURIL) 25 MG tablet Take 1 tablet (25 mg total) by mouth daily. 90 tablet 3   losartan (COZAAR) 50 MG tablet Take 1 tablet (50 mg total) by mouth daily. 90 tablet 1   tamsulosin (FLOMAX) 0.4 MG CAPS capsule Take 1 capsule (0.4 mg total) by mouth daily. 30 capsule 3   No current facility-administered medications on file prior to visit.   Past Medical History  Past Medical History:  Diagnosis Date   Arthritis    shoulders    Gout    High cholesterol    Hypertension    Dietary habits include: non compliant ate slim jim this AM, BBQ,  Exercise habits include:no Family / Social history: NO   O:  Physical Exam Vitals reviewed.  Constitutional:      Appearance: He is obese.  Cardiovascular:     Rate and Rhythm: Normal rate and regular rhythm.  Pulmonary:     Effort: Pulmonary effort is normal.     Breath sounds: Normal breath sounds.  Abdominal:     General: Bowel sounds are normal. There is distension.  Musculoskeletal:        General: Normal range of motion.     Cervical back: Normal range of motion.  Neurological:     Mental Status: He is alert and oriented to person, place, and time.  Psychiatric:        Mood and Affect: Mood normal.        Behavior: Behavior normal.         Thought Content: Thought content normal.        Judgment: Judgment normal.      Review of Systems  Musculoskeletal: Positive for joint pain.       From gout flare   All other systems reviewed and are negative.   Last 3 Office BP readings: BP Readings from Last 3 Encounters:  10/13/19 (!) 144/86  10/01/19 132/66  08/03/19 125/87    BMET    Component Value Date/Time   NA 141 06/10/2019 1045   K 4.9 06/10/2019 1045   CL 104 06/10/2019 1045   CO2 23 06/10/2019 1045   GLUCOSE 98 06/10/2019 1045   GLUCOSE 107 (H) 07/28/2008 1405   BUN 14 06/10/2019 1045   CREATININE 1.13 06/10/2019 1045   CALCIUM 10.1 06/10/2019 1045   GFRNONAA 71 06/10/2019 1045   GFRAA 82 06/10/2019 1045    Renal function: CrCl cannot be calculated (Patient's most recent lab result is older than the maximum 21 days allowed.).  Clinical ASCVD: Yes  The 10-year ASCVD risk score Mikey Bussing DC Jr., et al., 2013) is: 23.1%   Values used to calculate the score:     Age: 59 years     Sex: Male     Is Non-Hispanic African  American: Yes     Diabetic: No     Tobacco smoker: Yes     Systolic Blood Pressure: 213 mmHg     Is BP treated: Yes     HDL Cholesterol: 55 mg/dL     Total Cholesterol: 165 mg/dL   A/P: Wayne [name] was seen today for nicotine dependence and hypertension.  Diagnoses and all orders for this visit:  Elevated PSA -     Ambulatory referral to Urology  Essential hypertension Counseled on blood pressure goal of less than 130/80, low-sodium, DASH diet, medication compliance, 150 minutes of moderate intensity exercise per week. Discussed medication compliance, adverse effects. -     losartan (COZAAR) 100 MG tablet; Take 1 tablet (100 mg total) by mouth daily. -     hydrochlorothiazide (HYDRODIURIL) 25 MG tablet; Take 1 tablet (25 mg total) by mouth daily. -     amLODipine (NORVASC) 10 MG tablet; Take 1 tablet (10 mg total) by mouth daily.  History of gout Gout is an inflammatory arthritis  related to a hyperuricemia.  increase in purines red meats and seafoods, alcohol smoking, and obesity greater than 30 -     allopurinol (ZYLOPRIM) 300 MG tablet; Take 1 tablet (300 mg total) by mouth daily. -     naproxen (NAPROSYN) 500 MG tablet; Take 1 tablet (500 mg total) by mouth 2 (two) times daily with a meal.  Other orders -     tamsulosin (FLOMAX) 0.4 MG CAPS capsule; Take 1 capsule (0.4 mg total) by mouth daily. -     atorvastatin (LIPITOR) 20 MG tablet; Take 1 tablet (20 mg total) by mouth daily.   Hypertension longstanding currently  on current medications .  Amlodipine 10 mg hydrochlorothiazide 25 mg, COZAAR 25 mg all daily.  BP Goal = 130/80  mmHg. Patient is adherent with current medications.  -Increased dose of Cozaar from 50 mg to 100 mg daily -F/u labs ordered - -Counseled on lifestyle modifications for blood pressure control including reduced dietary sodium, increased exercise, adequate sleep  [name]

## 2019-10-13 NOTE — Patient Instructions (Signed)

## 2019-10-22 ENCOUNTER — Other Ambulatory Visit (INDEPENDENT_AMBULATORY_CARE_PROVIDER_SITE_OTHER): Payer: Self-pay | Admitting: Primary Care

## 2019-10-27 ENCOUNTER — Other Ambulatory Visit (INDEPENDENT_AMBULATORY_CARE_PROVIDER_SITE_OTHER): Payer: Self-pay | Admitting: Primary Care

## 2019-10-27 DIAGNOSIS — Z8739 Personal history of other diseases of the musculoskeletal system and connective tissue: Secondary | ICD-10-CM

## 2019-10-27 NOTE — Telephone Encounter (Signed)
Requested Prescriptions  Pending Prescriptions Disp Refills   naproxen (NAPROSYN) 500 MG tablet [Pharmacy Med Name: NAPROXEN 500MG  TABLETS] 30 tablet 0    Sig: TAKE 1 TABLET(500 MG) BY MOUTH TWICE DAILY WITH A MEAL     Analgesics:  NSAIDS Passed - 10/27/2019  3:24 AM      Passed - Cr in normal range and within 360 days    Creatinine, Ser  Date Value Ref Range Status  06/10/2019 1.13 0.76 - 1.27 mg/dL Final         Passed - HGB in normal range and within 360 days    Hemoglobin  Date Value Ref Range Status  06/10/2019 15.7 13.0 - 17.7 g/dL Final         Passed - Patient is not pregnant      Passed - Valid encounter within last 12 months    Recent Outpatient Visits          2 weeks ago Elevated PSA   [place], [name] P, NP   2 months ago Tobacco use disorder   [place] [name], NP   3 months ago Tobacco use disorder   [place] [name], NP   4 months ago Colon cancer screening   [place], [name] P, NP   5 months ago Mixed hyperlipidemia   [place] [name], NP

## 2019-11-16 ENCOUNTER — Other Ambulatory Visit (INDEPENDENT_AMBULATORY_CARE_PROVIDER_SITE_OTHER): Payer: Self-pay | Admitting: Primary Care

## 2019-11-16 DIAGNOSIS — I10 Essential (primary) hypertension: Secondary | ICD-10-CM

## 2019-11-16 DIAGNOSIS — Z8739 Personal history of other diseases of the musculoskeletal system and connective tissue: Secondary | ICD-10-CM

## 2019-11-18 ENCOUNTER — Other Ambulatory Visit: Payer: Self-pay

## 2019-11-18 ENCOUNTER — Encounter (INDEPENDENT_AMBULATORY_CARE_PROVIDER_SITE_OTHER): Payer: Self-pay | Admitting: Primary Care

## 2019-11-18 ENCOUNTER — Ambulatory Visit (INDEPENDENT_AMBULATORY_CARE_PROVIDER_SITE_OTHER): Payer: Medicaid Other | Admitting: Primary Care

## 2019-11-18 VITALS — BP 119/84 | HR 78 | Temp 97.3°F | Ht 75.0 in | Wt 249.6 lb

## 2019-11-18 DIAGNOSIS — Z0130 Encounter for examination of blood pressure without abnormal findings: Secondary | ICD-10-CM | POA: Diagnosis not present

## 2019-11-18 DIAGNOSIS — E782 Mixed hyperlipidemia: Secondary | ICD-10-CM

## 2019-11-18 DIAGNOSIS — Z716 Tobacco abuse counseling: Secondary | ICD-10-CM

## 2019-11-18 DIAGNOSIS — F17200 Nicotine dependence, unspecified, uncomplicated: Secondary | ICD-10-CM

## 2019-11-18 NOTE — Progress Notes (Signed)
Wayne [name]   [name]. [name] is a 59 year old obese male who presents for  hypertension evaluation, on previous visit medication was adjusted to include  Cozaar from 50 mg to 100 mg daily  Patient reports adherence with medications.  Current Medication List Current Outpatient Medications on File Prior to Visit  Medication Sig Dispense Refill  . allopurinol (ZYLOPRIM) 300 MG tablet Take 1 tablet (300 mg total) by mouth daily. 90 tablet 1  . amLODipine (NORVASC) 10 MG tablet Take 1 tablet (10 mg total) by mouth daily. 90 tablet 1  . atorvastatin (LIPITOR) 20 MG tablet Take 1 tablet (20 mg total) by mouth daily. 90 tablet 3  . buPROPion (WELLBUTRIN SR) 150 MG 12 hr tablet TAKE 1 TABLET(150 MG) BY MOUTH TWICE DAILY 180 tablet 0  . hydrochlorothiazide (HYDRODIURIL) 25 MG tablet Take 1 tablet (25 mg total) by mouth daily. 90 tablet 3  . losartan (COZAAR) 100 MG tablet Take 1 tablet (100 mg total) by mouth daily. 90 tablet 1  . naproxen (NAPROSYN) 500 MG tablet TAKE 1 TABLET(500 MG) BY MOUTH TWICE DAILY WITH A MEAL 30 tablet 0  . tamsulosin (FLOMAX) 0.4 MG CAPS capsule Take 1 capsule (0.4 mg total) by mouth daily. 30 capsule 3   No current facility-administered medications on file prior to visit.   Past Medical History  Past Medical History:  Diagnosis Date  . Arthritis    shoulders   . Gout   . High cholesterol   . Hypertension    Dietary habits include: dietary life style  Exercise habits include: as tolerated  Family / Social history: unknown   ASCVD risk factors include- [name]  O:  Physical Exam Vitals reviewed.  Constitutional:      Appearance: He is obese.  HENT:     Head: Normocephalic.     Nose: Nose normal.  Cardiovascular:     Rate and Rhythm: Normal rate and regular rhythm.  Pulmonary:     Effort: Pulmonary effort is normal.     Breath sounds: Normal breath sounds.  Abdominal:     General: Bowel sounds are normal. There is distension.      Palpations: Abdomen is soft.  Musculoskeletal:     Cervical back: Normal range of motion.  Neurological:     Mental Status: He is alert.      ROS  Last 3 Office BP readings: BP Readings from Last 3 Encounters:  11/18/19 119/84  10/13/19 (!) 144/86  10/01/19 132/66    BMET    Component Value Date/Time   NA 141 06/10/2019 1045   K 4.9 06/10/2019 1045   CL 104 06/10/2019 1045   CO2 23 06/10/2019 1045   GLUCOSE 98 06/10/2019 1045   GLUCOSE 107 (H) 07/28/2008 1405   BUN 14 06/10/2019 1045   CREATININE 1.13 06/10/2019 1045   CALCIUM 10.1 06/10/2019 1045   GFRNONAA 71 06/10/2019 1045   GFRAA 82 06/10/2019 1045    Renal function: CrCl cannot be calculated (Patient's most recent lab result is older than the maximum 21 days allowed.).  Clinical ASCVD: Yes  The 10-year ASCVD risk score Mikey Bussing DC Jr., et al., 2013) is: 17.3%   Values used to calculate the score:     Age: 53 years     Sex: Male     Is Non-Hispanic African American: Yes     Diabetic: No     Tobacco smoker: Yes     Systolic Blood Pressure: 157 mmHg  Is BP treated: Yes     HDL Cholesterol: 55 mg/dL     Total Cholesterol: 165 mg/dL  Tobacco use disorder He is aware of risk for lung cancer and other respiratory diseases recommend cessation.  This will be reminded at each clinical visit. On Wellbutrin dual purpose depression and smoking cessation started last visit   Mixed hyperlipidemia Currently on atorvastatin 20mg  continue to decrease your fatty foods, red meat, cheese, milk and increase fiber like whole grains and veggies. -     Lipid panel; Future -     Lipid panel  Blood pressure check Hypertension longstanding diagnosed currently on HCTZ 25mg , Cozaar 100mg  and amlodipine 10 mg daily  current medications. BP Goal = 130/80  MmHg.  Patient is adherent with current medications.  -Continued  -F/u labs ordered - none fasting labs will be done today  -Counseled on lifestyle modifications for blood  pressure control including reduced dietary sodium, increased exercise, adequate sleep  [name]

## 2019-11-18 NOTE — Patient Instructions (Signed)

## 2019-11-19 LAB — LIPID PANEL
Chol/HDL Ratio: 4.8 ratio (ref 0.0–5.0)
Cholesterol, Total: 179 mg/dL (ref 100–199)
HDL: 37 mg/dL — ABNORMAL LOW (ref >39)
LDL Chol Calc (NIH): 101 mg/dL — ABNORMAL HIGH (ref 0–99)
Triglycerides: 239 mg/dL — ABNORMAL HIGH (ref 0–149)
VLDL Cholesterol Cal: 41 mg/dL — ABNORMAL HIGH (ref 5–40)

## 2019-11-23 ENCOUNTER — Other Ambulatory Visit (INDEPENDENT_AMBULATORY_CARE_PROVIDER_SITE_OTHER): Payer: Self-pay | Admitting: Primary Care

## 2019-11-23 ENCOUNTER — Telehealth (INDEPENDENT_AMBULATORY_CARE_PROVIDER_SITE_OTHER): Payer: Self-pay

## 2019-11-23 DIAGNOSIS — E782 Mixed hyperlipidemia: Secondary | ICD-10-CM

## 2019-11-23 MED ORDER — ATORVASTATIN CALCIUM 40 MG PO TABS: 80.0000 mg | ORAL_TABLET | Freq: Every day | ORAL | 1 refills | Status: DC

## 2019-11-23 NOTE — Telephone Encounter (Signed)
Patient is aware that cholesterol has increased. Advised that atorvastatin has been increased to 40 mg. Instructed him to take two of the 20 mg until the bottle is empty then pick up Rx for 40 mg. He verbalized understanding. Nat Christen, CMA

## 2019-11-23 NOTE — Telephone Encounter (Signed)
-----   Message from [name], NP sent at 11/23/2019  9:20 AM EDT ----- Cholesterol levels have increased previously normal This can lead to heart attack and stroke. To lower your number you can decrease your fatty foods, red meat, cheese, milk and increase fiber like whole grains and veggies.  Increase your atorvastatin from 20mg  to 40mg . Take 2 (20mg ) until current bottle is complete than pick new dosage and take only (1) 40mg  atorvastatin

## 2019-12-16 DIAGNOSIS — R972 Elevated prostate specific antigen [PSA]: Secondary | ICD-10-CM | POA: Diagnosis not present

## 2019-12-24 DIAGNOSIS — R972 Elevated prostate specific antigen [PSA]: Secondary | ICD-10-CM | POA: Diagnosis not present

## 2020-01-07 DIAGNOSIS — C61 Malignant neoplasm of prostate: Secondary | ICD-10-CM | POA: Diagnosis not present

## 2020-01-08 ENCOUNTER — Telehealth: Payer: Self-pay | Admitting: *Deleted

## 2020-01-08 NOTE — Telephone Encounter (Signed)
LVM for a call back to schedule appointment with Dr. Tammi Klippel.

## 2020-01-14 ENCOUNTER — Telehealth (INDEPENDENT_AMBULATORY_CARE_PROVIDER_SITE_OTHER): Payer: Self-pay | Admitting: Primary Care

## 2020-01-14 NOTE — Telephone Encounter (Signed)
Pt seen michelle in oct 2021 and was prescribed naproxen for his gout pain and per pt he told michelle that medication was not working. Pt is calling and would like michelle to send in new rx ibuprofen to [facility] on [address] in [place]

## 2020-01-17 DIAGNOSIS — C61 Malignant neoplasm of prostate: Secondary | ICD-10-CM | POA: Insufficient documentation

## 2020-01-17 NOTE — Progress Notes (Signed)
Radiation Oncology         [phone]) [phone] ________________________________  Initial outpatient Consultation  Name: Wayne [name] MRN: [id number]  Date: 01/18/2020  DOB: Dec 04, 1960  [id number], Milford Cage, NP  Robley Fries, MD   REFERRING PHYSICIAN: Robley Fries, MD  DIAGNOSIS: 59 y.o. gentleman with stage T1c adenocarcinoma of the prostate with a Gleason's score of 4+3 and a PSA of 112    ICD-10-CM   1. Malignant neoplasm of prostate ([place])  C61     HISTORY OF PRESENT ILLNESS::Wayne [name] is a 59 y.o. gentleman.  He was noted to have an elevated PSA of 113 by his primary care nurse practitioner, Juluis Mire on 06/10/19.  Accordingly, he was referred on 10/13/19 for evaluation in urology by Dr. Claudia Desanctis on 12/16/19,  digital rectal examination was performed at that time revealing a smooth 40 gm gland without nodules.  Repeat PSA was 112.  The patient proceeded to transrectal ultrasound with 12 biopsies of the prostate on 12/24/19.  The prostate volume measured 66 cc.  Out of 12 core biopsies, 10 were positive.  The maximum Gleason score was 4+3, and this was seen in 4 areas, with 3+4 in the others.  The patient reviewed the biopsy results with his urologist and he has kindly been referred today for discussion of potential radiation treatment options.  PREVIOUS RADIATION THERAPY: No  PAST MEDICAL HISTORY:  has a past medical history of Arthritis, Gout, High cholesterol, Hypertension, and Prostate cancer ([place]).    PAST SURGICAL HISTORY: Past Surgical History:  Procedure Laterality Date  . KNEE ARTHROSCOPY    . LUMBAR DISC SURGERY      FAMILY HISTORY: family history includes Colon cancer in his mother; Pancreatic cancer in his maternal uncle.  SOCIAL HISTORY:  reports that he has been smoking cigarettes. He has been smoking about 0.50 packs per day. He has never used smokeless tobacco. He reports current alcohol use of about 14.0 standard drinks of alcohol per week. He  reports that he does not use drugs.  ALLERGIES: Hydralazine  MEDICATIONS:  Current Outpatient Medications  Medication Sig Dispense Refill  . allopurinol (ZYLOPRIM) 300 MG tablet Take 1 tablet (300 mg total) by mouth daily. 90 tablet 1  . amLODipine (NORVASC) 10 MG tablet Take 1 tablet (10 mg total) by mouth daily. 90 tablet 1  . atorvastatin (LIPITOR) 40 MG tablet Take 2 tablets (80 mg total) by mouth daily. 90 tablet 1  . buPROPion (WELLBUTRIN SR) 150 MG 12 hr tablet TAKE 1 TABLET(150 MG) BY MOUTH TWICE DAILY 180 tablet 0  . hydrochlorothiazide (HYDRODIURIL) 25 MG tablet Take 1 tablet (25 mg total) by mouth daily. 90 tablet 3  . ibuprofen (ADVIL) 800 MG tablet Take 800 mg by mouth 3 (three) times daily as needed.    Marland Kitchen losartan (COZAAR) 100 MG tablet Take 1 tablet (100 mg total) by mouth daily. 90 tablet 1  . naproxen (NAPROSYN) 500 MG tablet TAKE 1 TABLET(500 MG) BY MOUTH TWICE DAILY WITH A MEAL 30 tablet 0  . tamsulosin (FLOMAX) 0.4 MG CAPS capsule Take 1 capsule (0.4 mg total) by mouth daily. 30 capsule 3   No current facility-administered medications for this encounter.    REVIEW OF SYSTEMS:  A 15 point review of systems is documented in the electronic medical record. This was obtained by the nursing staff. However, I reviewed this with the patient to discuss relevant findings and make appropriate changes.  Pertinent items noted in HPI  and remainder of comprehensive ROS otherwise negative..  The patient completed an IPSS and IIEF questionnaire.  His IPSS score was 2 indicating mild urinary outflow obstructive symptoms.     PHYSICAL EXAM: This patient is in no acute distress.  He is alert and oriented.   height is 6\' 2"  (1.88 m) and weight is 250 lb (113.4 kg). His temperature is 98.2 F (36.8 C). His blood pressure is 157/96 (abnormal) and his pulse is 78. His respiration is 18 and oxygen saturation is 100%.  He exhibits no respiratory distress or labored breathing.  He appears  neurologically intact.  His mood is pleasant.  His affect is appropriate.  Please note the digital rectal exam findings described above.  KPS = 100  100 - Normal; no complaints; no evidence of disease. 90   - Able to carry on normal activity; minor signs or symptoms of disease. 80   - Normal activity with effort; some signs or symptoms of disease. 74   - Cares for self; unable to carry on normal activity or to do active work. 60   - Requires occasional assistance, but is able to care for most of his personal needs. 50   - Requires considerable assistance and frequent medical care. 67   - Disabled; requires special care and assistance. 71   - Severely disabled; hospital admission is indicated although death not imminent. 93   - Very sick; hospital admission necessary; active supportive treatment necessary. 10   - Moribund; fatal processes progressing rapidly. 0     - Dead  Karnofsky DA, Abelmann [place], [place] LS and [place] JH [phone]) The use of the nitrogen mustards in the palliative treatment of carcinoma: with particular reference to bronchogenic carcinoma Cancer 1 634-56   LABORATORY DATA:  Lab Results  Component Value Date   WBC 8.6 06/10/2019   HGB 15.7 06/10/2019   HCT 45.8 06/10/2019   MCV 90 06/10/2019   PLT 320 06/10/2019   Lab Results  Component Value Date   NA 141 06/10/2019   K 4.9 06/10/2019   CL 104 06/10/2019   CO2 23 06/10/2019   Lab Results  Component Value Date   ALT 26 06/10/2019   AST 21 06/10/2019   ALKPHOS 114 06/10/2019   BILITOT 0.3 06/10/2019     RADIOGRAPHY: No results found.    IMPRESSION: This gentleman is a 59 y.o. gentleman with stage T1c adenocarcinoma of the prostate with a Gleason's score of 4+3 and a PSA of 112.  His PSA puts him into the high risk group.  Accordingly he is eligible for a variety of potential treatment options including LT-ADT with external radiation versus prostatectomy.  His staging scans are scheduled for Jan  5th.  PLAN:Today I reviewed the findings and workup thus far.  We discussed the natural history of prostate cancer.  We reviewed the the implications of T-stage, Gleason's Score, and PSA on decision-making and outcomes in prostate cancer.  We discussed radiation treatment in the management of prostate cancer with regard to the logistics and delivery of external beam radiation treatment as well as the logistics and delivery of prostate brachytherapy.  We compared and contrasted each of these approaches and also compared these against prostatectomy.  The patient expressed interest in prostatectomy up-front.    If his staging studies show no or limited metastatic disease, the patient may be most interested in RA-LRP.  I will share my findings with Dr. Claudia Desanctis and look forward to updating our information when  staging is complete.  Total encounter time was 40 min.   ------------------------------------------------  Sheral Apley. Tammi Klippel, M.D.

## 2020-01-18 ENCOUNTER — Encounter: Payer: Self-pay | Admitting: Radiation Oncology

## 2020-01-18 ENCOUNTER — Other Ambulatory Visit (HOSPITAL_COMMUNITY): Payer: Self-pay | Admitting: Urology

## 2020-01-18 ENCOUNTER — Encounter: Payer: Self-pay | Admitting: Medical Oncology

## 2020-01-18 ENCOUNTER — Other Ambulatory Visit: Payer: Self-pay

## 2020-01-18 ENCOUNTER — Ambulatory Visit
Admission: RE | Admit: 2020-01-18 | Discharge: 2020-01-18 | Disposition: A | Discharge status: Home / Self Care | Payer: Medicaid Other | Source: Ambulatory Visit | Attending: Radiation Oncology | Admitting: Radiation Oncology

## 2020-01-18 VITALS — BP 157/96 | HR 78 | Temp 98.2°F | Resp 18 | Ht 74.0 in | Wt 250.0 lb

## 2020-01-18 DIAGNOSIS — Z791 Long term (current) use of non-steroidal anti-inflammatories (NSAID): Secondary | ICD-10-CM | POA: Diagnosis not present

## 2020-01-18 DIAGNOSIS — M129 Arthropathy, unspecified: Secondary | ICD-10-CM | POA: Diagnosis not present

## 2020-01-18 DIAGNOSIS — E7800 Pure hypercholesterolemia, unspecified: Secondary | ICD-10-CM | POA: Diagnosis not present

## 2020-01-18 DIAGNOSIS — C61 Malignant neoplasm of prostate: Secondary | ICD-10-CM | POA: Diagnosis not present

## 2020-01-18 DIAGNOSIS — F17210 Nicotine dependence, cigarettes, uncomplicated: Secondary | ICD-10-CM | POA: Diagnosis not present

## 2020-01-18 DIAGNOSIS — Z800 Family history of malignant neoplasm of digestive organs: Secondary | ICD-10-CM | POA: Diagnosis not present

## 2020-01-18 DIAGNOSIS — Z79899 Other long term (current) drug therapy: Secondary | ICD-10-CM | POA: Insufficient documentation

## 2020-01-18 DIAGNOSIS — I10 Essential (primary) hypertension: Secondary | ICD-10-CM | POA: Diagnosis not present

## 2020-01-18 HISTORY — DX: Malignant neoplasm of prostate: C61

## 2020-01-18 NOTE — Progress Notes (Signed)
Spoke with Noreene Larsson at [facility] to get CT and bone scan scheduled. He is scheduled 1/5 @ 9/15. Noreene Larsson will call patient with dates and instructions. Dr. Kathrynn Running notified.

## 2020-01-18 NOTE — Progress Notes (Signed)
Introduced myself to patient as the prostate nurse navigator and discussed my role. He states he does not know of any prostate cancer in his family, but his mother had colon and a maternal aunt had pancreatic. He has not completed staging and has not been contacted with appointments for bone scan and CT scan. I will follow up with [facility] and call him back. No barriers to care were identified at this time. I gave him my business card and asked him to call em with questions or concerns. He voiced understanding.

## 2020-01-18 NOTE — Progress Notes (Signed)
GU Location of Tumor / Histology: prostatic adenocarcinoma  If Prostate Cancer, Gleason Score is (4 + 3) and PSA is (112). Prostate volume: 66  Wayne [name] presented to his PCP for a routine physical in May 2021. PCP discovered a psa of 113 and referred to Dr. Arita Miss for further evaluation.  Biopsies of prostate (if applicable) revealed:    Past/Anticipated interventions by urology, if any: biopsy, referral to Dr. Kathrynn Running to discuss radiation options, bone scan and CT scan to be ordered (requested Felicita Gage, prostate navigator look into this)  Past/Anticipated interventions by medical oncology, if any: no  Weight changes, if any: no  Bowel/Bladder complaints, if any: IPSS 2. SHIM 10. Denies dysuria or hematuria. Denies urinary leakage or incontinence. Denies any bowel complaints.  Nausea/Vomiting, if any: no  Pain issues, if any:  Denies. Explains the only pain he ever has is associated with gout flares.   SAFETY ISSUES:  Prior radiation? no  Pacemaker/ICD? no  Possible current pregnancy? no, male patient  Is the patient on methotrexate? no  Current Complaints / other details:  59 year old male. Married with 4 children. Resides in [place].

## 2020-01-19 ENCOUNTER — Other Ambulatory Visit (INDEPENDENT_AMBULATORY_CARE_PROVIDER_SITE_OTHER): Payer: Self-pay | Admitting: Primary Care

## 2020-01-19 MED ORDER — IBUPROFEN 800 MG PO TABS: 800.0000 mg | ORAL_TABLET | Freq: Three times a day (TID) | ORAL | 1 refills | Status: DC | PRN

## 2020-01-20 ENCOUNTER — Ambulatory Visit: Payer: Medicaid Other

## 2020-01-20 ENCOUNTER — Ambulatory Visit: Payer: Medicaid Other | Admitting: Radiation Oncology

## 2020-01-27 ENCOUNTER — Ambulatory Visit (HOSPITAL_COMMUNITY)
Admission: RE | Admit: 2020-01-27 | Discharge: 2020-01-27 | Disposition: A | Discharge status: Home / Self Care | Payer: Medicaid Other | Source: Ambulatory Visit | Attending: Urology | Admitting: Urology

## 2020-01-27 ENCOUNTER — Encounter (HOSPITAL_COMMUNITY)
Admission: RE | Admit: 2020-01-27 | Discharge: 2020-01-27 | Disposition: A | Discharge status: Home / Self Care | Payer: Medicaid Other | Source: Ambulatory Visit | Attending: Urology | Admitting: Urology

## 2020-01-27 ENCOUNTER — Other Ambulatory Visit: Payer: Self-pay

## 2020-01-27 DIAGNOSIS — C61 Malignant neoplasm of prostate: Secondary | ICD-10-CM | POA: Diagnosis not present

## 2020-01-27 DIAGNOSIS — M47816 Spondylosis without myelopathy or radiculopathy, lumbar region: Secondary | ICD-10-CM | POA: Diagnosis not present

## 2020-01-27 DIAGNOSIS — M170 Bilateral primary osteoarthritis of knee: Secondary | ICD-10-CM | POA: Diagnosis not present

## 2020-01-27 MED ORDER — TECHNETIUM TC 99M MEDRONATE IV KIT
19.8000 | PACK | Freq: Once | INTRAVENOUS | Status: AC
  Administered 2020-01-27: 19.8 via INTRAVENOUS

## 2020-02-05 ENCOUNTER — Encounter: Payer: Self-pay | Admitting: Medical Oncology

## 2020-02-05 NOTE — Progress Notes (Signed)
Patient called stating he has not heard from his scans and does not know steps are next. He is interested in surgery to treat his cancer. I will follow up with Dr. Keane Scrape office and call him back. He voiced understanding.

## 2020-02-05 NOTE — Progress Notes (Signed)
Spoke with patient to let him know I left a message with [facility] that he needs scan results and he needs follow up with Dr. Claudia Desanctis to discuss surgery. I asked him to call me back if he does not get a call in a couple of days. He voiced understanding and appreciation.

## 2020-02-12 ENCOUNTER — Ambulatory Visit: Payer: Medicaid Other

## 2020-02-12 ENCOUNTER — Ambulatory Visit: Payer: Medicaid Other | Admitting: Radiation Oncology

## 2020-02-17 ENCOUNTER — Other Ambulatory Visit (HOSPITAL_COMMUNITY): Payer: Self-pay | Admitting: Urology

## 2020-02-17 DIAGNOSIS — C61 Malignant neoplasm of prostate: Secondary | ICD-10-CM

## 2020-02-25 ENCOUNTER — Ambulatory Visit (INDEPENDENT_AMBULATORY_CARE_PROVIDER_SITE_OTHER): Payer: Medicaid Other | Admitting: Orthopaedic Surgery

## 2020-02-25 DIAGNOSIS — M170 Bilateral primary osteoarthritis of knee: Secondary | ICD-10-CM | POA: Diagnosis not present

## 2020-02-25 NOTE — Progress Notes (Signed)
Office Visit Note   Patient: Wayne [name]           Date of Birth: August 27, 1960           MRN: [id number] Visit Date: 02/25/2020              Requested by: [name], NP [address] [place],  [place] [address] PCP: [name], NP   Assessment & Plan: Visit Diagnoses:  1. Bilateral primary osteoarthritis of knee     Plan: Impression is advanced degenerative joint disease both knees.  Patient is elected to repeat cortisone injections today.  We also discussed viscosupplementation injections in the future if needed.  Follow-up with [place] as needed.  Follow-Up Instructions: Return if symptoms worsen or fail to improve.   Orders:  Orders Placed This Encounter  Procedures  . Large Joint Inj: bilateral knee   No orders of the defined types were placed in this encounter.     Procedures: Large Joint Inj: bilateral knee on 02/25/2020 10:17 AM Indications: pain Details: 22 G needle, anterolateral approach Medications (Right): 0.66 mL bupivacaine 0.25 %; 3 mL lidocaine 1 %; 13.33 mg methylPREDNISolone acetate 40 MG/ML Medications (Left): 0.66 mL bupivacaine 0.25 %; 3 mL lidocaine 1 %; 13.33 mg methylPREDNISolone acetate 40 MG/ML      Clinical Data: No additional findings.   Subjective: Chief Complaint  Patient presents with  . Left Knee - Pain    HPI patient is a pleasant 60 year old gentleman who comes in today with recurrent bilateral knee pain left greater than right.  History of advanced degenerative joint disease for which she has seen by [place] in the past.  He was seen this past May where both knees were injected with cortisone.  Great relief of symptoms until about 2 days ago.  No new injury or change in activity.  The pain he has primarily to the medial aspect and is worse with ambulation.  He gets relief with putting pillows between his legs while sleeping.  He has taken ibuprofen with mild relief of symptoms.  Review of Systems as detailed in HPI.   All others reviewed and are negative.   Objective: Vital Signs: There were no vitals taken for this visit.  Physical Exam well-developed well-nourished gentleman in no acute distress.  Alert oriented x3.  Ortho Exam bilateral knee exams show trace effusion.  Range of motion 0 to 115 degrees.  Medial joint line tenderness both sides.  Marked L femoral crepitus both sides.  He is neurovascular intact distally.  Specialty Comments:  No specialty comments available.  Imaging: No new imaging   PMFS History: Patient Active Problem List   Diagnosis Date Noted  . Malignant neoplasm of prostate ([place]) 01/17/2020  . 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 Essential hypertension 08/12/2017  . Unilateral primary osteoarthritis, left knee 05/25/2016  . Unilateral primary osteoarthritis, right knee 05/25/2016   Past Medical History:  Diagnosis Date  . Arthritis    shoulders   . Gout   . High cholesterol   . Hypertension   . Prostate cancer [facility])     Family History  Problem Relation Age of Onset  . Colon cancer Mother   . Pancreatic cancer Maternal Uncle   . Colon polyps Neg Hx   . Esophageal cancer Neg Hx   . Rectal cancer Neg Hx     Past Surgical History:  Procedure Laterality Date  . KNEE ARTHROSCOPY    .  LUMBAR DISC SURGERY     Social History   Occupational History    Comment: patient does not work full time  Tobacco Use  . Smoking status: Current Every Day Smoker    Packs/day: 0.50    Types: Cigarettes  . Smokeless tobacco: Never Used  . Tobacco comment: Started smoking at the age of 48  Vaping Use  . Vaping Use: Never used  Substance and Sexual Activity  . Alcohol use: Yes    Alcohol/week: 14.0 standard drinks    Types: 14 Cans of beer per week  . Drug use: No  . Sexual activity: Yes

## 2020-02-26 MED ORDER — METHYLPREDNISOLONE ACETATE 40 MG/ML IJ SUSP
13.3300 mg | INTRAMUSCULAR | Status: AC | PRN
  Administered 2020-02-25: 13.33 mg via INTRA_ARTICULAR

## 2020-02-26 MED ORDER — LIDOCAINE HCL 1 % IJ SOLN
3.0000 mL | INTRAMUSCULAR | Status: AC | PRN
  Administered 2020-02-25: 3 mL

## 2020-02-26 MED ORDER — BUPIVACAINE HCL 0.25 % IJ SOLN
0.6600 mL | INTRAMUSCULAR | Status: AC | PRN
  Administered 2020-02-25: .66 mL via INTRA_ARTICULAR

## 2020-03-03 ENCOUNTER — Ambulatory Visit (HOSPITAL_COMMUNITY)
Admission: RE | Admit: 2020-03-03 | Discharge: 2020-03-03 | Disposition: A | Discharge status: Home / Self Care | Payer: Medicaid Other | Source: Ambulatory Visit | Attending: Urology | Admitting: Urology

## 2020-03-03 ENCOUNTER — Other Ambulatory Visit: Payer: Self-pay

## 2020-03-03 DIAGNOSIS — C61 Malignant neoplasm of prostate: Secondary | ICD-10-CM | POA: Insufficient documentation

## 2020-03-03 MED ORDER — PIFLIFOLASTAT F 18 (PYLARIFY) INJECTION
9.0000 | Freq: Once | INTRAVENOUS | Status: AC
  Administered 2020-03-03: 9.4 via INTRAVENOUS

## 2020-03-22 ENCOUNTER — Other Ambulatory Visit: Payer: Self-pay | Admitting: Urology

## 2020-03-22 DIAGNOSIS — C61 Malignant neoplasm of prostate: Secondary | ICD-10-CM | POA: Diagnosis not present

## 2020-03-28 ENCOUNTER — Other Ambulatory Visit: Payer: Self-pay

## 2020-03-28 ENCOUNTER — Ambulatory Visit: Payer: Medicaid Other | Admitting: Physical Therapy

## 2020-03-28 ENCOUNTER — Ambulatory Visit: Payer: Medicaid Other | Attending: Urology | Admitting: Physical Therapy

## 2020-03-28 ENCOUNTER — Encounter: Payer: Self-pay | Admitting: Physical Therapy

## 2020-03-28 DIAGNOSIS — R252 Cramp and spasm: Secondary | ICD-10-CM | POA: Insufficient documentation

## 2020-03-28 DIAGNOSIS — R279 Unspecified lack of coordination: Secondary | ICD-10-CM | POA: Insufficient documentation

## 2020-03-28 NOTE — Patient Instructions (Addendum)
When to call the doctor 1. Blood in the urine 2. Increased pain in right leg at night  Bed Positioning after surgery 1. Pillows to support right hip and knee   After surgery- ice to area for 10 min 3 times per day  Self massage to perineum  Sitting posture  Resume: Gluteal squeeze, pelvic floor exercise, transverse abdominal contraction  Prior to surgery -Hamstring stretch -piriformis -butterfly stretch -Bridge -pelvic floor exercise laying down and sitting  Toileting Techniques for Bowel Movements (Defecation) Using your belly (abdomen) and pelvic floor muscles to have a bowel movement is usually instinctive.  Sometimes people can have problems with these muscles and have to relearn proper defecation (emptying) techniques.  If you have weakness in your muscles, organs that are falling out, decreased sensation in your pelvis, or ignore your urge to go, you may find yourself straining to have a bowel movement.  You are straining if you are: . holding your breath or taking in a huge gulp of air and holding it  . keeping your lips and jaw tensed and closed tightly . turning red in the face because of excessive pushing or forcing . developing or worsening your  hemorrhoids . getting faint while pushing . not emptying completely and have to defecate many times a day  If you are straining, you are actually making it harder for yourself to have a bowel movement.  Many people find they are pulling up with the pelvic floor muscles and closing off instead of opening the anus. Due to lack pelvic floor relaxation and coordination the abdominal muscles, one has to work harder to push the feces out.  Many people have never been taught how to defecate efficiently and effectively.  Notice what happens to your body when you are having a bowel movement.  While you are sitting on the toilet pay attention to the following areas: . Jaw and mouth position . Angle of your hips   . Whether your feet touch  the ground or not . Arm placement  . Spine position . Waist . Belly tension . Anus (opening of the anal canal)  An Evacuation/Defecation Plan   Here are the 4 basic points:  1. Lean forward enough for your elbows to rest on your knees 2. Support your feet on the floor or use a low stool if your feet don't touch the floor  3. Push out your belly as if you have swallowed a beach ball--you should feel a widening of your waist 4. Open and relax your pelvic floor muscles, rather than tightening around the anus      The following conditions my require modifications to your toileting posture:  . If you have had surgery in the past that limits your back, hip, pelvic, knee or ankle flexibility . Constipation   Your healthcare practitioner may make the following additional suggestions and adjustments:  1) Sit on the toilet  a) Make sure your feet are supported. b) Notice your hip angle and spine position--most people find it effective to lean forward or raise their knees, which can help the muscles around the anus to relax  c) When you lean forward, place your forearms on your thighs for support  2) Relax suggestions a) Breath deeply in through your nose and out slowly through your mouth as if you are smelling the flowers and blowing out the candles. b) To become aware of how to relax your muscles, contracting and releasing muscles can be helpful.  Pull your  pelvic floor muscles in tightly by using the image of holding back gas, or closing around the anus (visualize making a circle smaller) and lifting the anus up and in.  Then release the muscles and your anus should drop down and feel open. Repeat 5 times ending with the feeling of relaxation. c) Keep your pelvic floor muscles relaxed; let your belly bulge out. d) The digestive tract starts at the mouth and ends at the anal opening, so be sure to relax both ends of the tube.  Place your tongue on the roof of your mouth with your teeth  separated.  This helps relax your mouth and will help to relax the anus at the same time.  3) Empty (defecation) a) Keep your pelvic floor and sphincter relaxed, then bulge your anal muscles.  Make the anal opening wide.  b) Stick your belly out as if you have swallowed a beach ball. c) Make your belly wall hard using your belly muscles while continuing to breathe. Doing this makes it easier to open your anus. d) Breath out and give a grunt (or try using other sounds such as ahhhh, shhhhh, ohhhh or grrrrrrr).  4) Finish a) As you finish your bowel movement, pull the pelvic floor muscles up and in.  This will leave your anus in the proper place rather than remaining pushed out and down. If you leave your anus pushed out and down, it will start to feel as though that is normal and give you incorrect signals about needing to have a bowel movement.    [facility] [address] [place], [place] [address] Access Code: [email or web address] URL: [email or web address] Date: 03/28/2020 Prepared by: Jari Favre  Exercises Standing Hamstring Stretch with Step - 1 x daily - 7 x weekly - 1 sets - 3 reps - 30 sec hold Supine Butterfly Groin Stretch - 1 x daily - 7 x weekly - 3 sets - 10 reps Supine Figure 4 Piriformis Stretch - 1 x daily - 7 x weekly - 1 sets - 3 reps - 30 sec hold Correct Seated Posture - 1 x daily - 7 x weekly - 3 sets - 10 reps Seated Diaphragmatic Breathing - 3 x daily - 7 x weekly - 1 sets - 10 reps Hooklying Pelvic Floor Contraction - 1 x daily - 7 x weekly - 3 sets - 10 reps - 1-20 sec hold Seated Pelvic Floor Contraction - 3 x daily - 7 x weekly - 1 sets - 10 reps - 2 hold

## 2020-03-28 NOTE — Therapy (Signed)
[facility] [address], [place], [place], [address] Phone: [phone]   Fax:  [phone]  Physical Therapy Evaluation  Patient Details  Name: Wayne [name] MRN: [id number] Date of Birth: 24-Jan-1960 Referring Provider (PT): Raynelle Bring, MD   Encounter Date: 03/28/2020   PT End of Session - 03/28/20 1105    Visit Number 1    Date for PT Re-Evaluation 07/18/20    Authorization Type medicaid UHC community    PT Start Time [id number]    PT Stop Time 1015    PT Time Calculation (min) 50 min    Activity Tolerance Patient tolerated treatment well    Behavior During Therapy [facility] for tasks assessed/performed           Past Medical History:  Diagnosis Date  . Arthritis    shoulders   . Gout   . High cholesterol   . Hypertension   . Prostate cancer [facility])     Past Surgical History:  Procedure Laterality Date  . KNEE ARTHROSCOPY    . LUMBAR DISC SURGERY      There were no vitals filed for this visit.    Subjective Assessment - 03/28/20 1106    Subjective Pt is here for pre-op information.  Pt is not currently experiencing any bowel or bladder symptoms.    Patient Stated Goals know how to exercise    Currently in Pain? No/denies              [facility] PT Assessment - 03/28/20 0001      Assessment   Medical Diagnosis C61 (ICD-10-CM) - Malignant neoplasm of prostate    Referring Provider (PT) Raynelle Bring, MD    Prior Therapy No      Precautions   Precautions None      Balance Screen   Has the patient fallen in the past 6 months No      [place] residence    Living Arrangements Spouse/significant other      Prior Function   Level of Independence Independent    Vocation --      Cognition   Overall Cognitive Status Within Functional Limits for tasks assessed      Posture/Postural Control   Posture/Postural Control Postural limitations    Postural  Limitations Anterior pelvic tilt;Rounded Shoulders;Flexed trunk      ROM / Strength   AROM / PROM / Strength PROM;AROM      AROM   Overall AROM Comments lumbar flexion 50%      PROM   Overall PROM Comments hip ER 50% Rt; 60% Lt      Flexibility   Soft Tissue Assessment /Muscle Length yes    Hamstrings 70%      Palpation   Palpation comment lumbar, gluteals tight, upper traps and pecs tight      Ambulation/Gait   Gait Pattern Within Functional Limits                      Objective measurements completed on examination: See above findings.     Pelvic Floor Special Questions - 03/28/20 0001    External Palpation palpated externally to clothing; able to contract and relax and educated in bulging            [facility] Adult PT Treatment/Exercise - 03/28/20 0001      Self-Care   Self-Care Other Self-Care Comments    Other Self-Care Comments  as seen  in chart                  PT Education - 03/28/20 1008    Education Details Access Code: [email or web address], toileting, prostatectomy info    Person(s) Educated Patient    Methods Explanation;Demonstration;Handout    Comprehension Verbalized understanding;Returned demonstration            PT Short Term Goals - 03/28/20 1020      PT SHORT TERM GOAL #1   Title ind with initial HEP    Time 4    Period Weeks    Status New    Target Date 04/25/20             PT Long Term Goals - 03/28/20 1012      PT LONG TERM GOAL #1   Title Pt will be ind with HEP for core stability and bladder control after surgery    Time 12    Period Weeks    Status New    Target Date 07/18/20      PT LONG TERM GOAL #2   Title Pt will be able to do normal daily activities without leakage    Time 16    Period Weeks    Status New    Target Date 07/18/20      PT LONG TERM GOAL #3   Title Pt will have normal bowel and bladder habits without straining    Time 16    Period Weeks    Status New    Target Date 07/18/20      PT  LONG TERM GOAL #4   Title Pt will report pain due to sugery is better due to management with self massage and stretches    Time 16    Period Weeks    Status New    Target Date 07/18/20                  Plan - 03/28/20 1021    Clinical Impression Statement Pt presents to skilled PT due to prostate cancer and upcoming prostatectomy.  Pt has tension in gluteals, lumbar paraspinals and hamstrings.  Decreased hip ER bilat  Rt.Lt.  Pt was assessed for ability to engaged the pelvic floor done externally.  Pt was able to demonstrate good contract and relax.  Due to tension throughout the posterior chain, he was given stretches and performed breathing and bulging techniques.  Pt was given basic information about how to perform initial stretches and exercises as well as to begin walking routine.  Pt will benefit from follow up after sugery to ensure he is correctly able to progress his exercises for maximum function and bladder control as well as pain mangement after surgery.    Personal Factors and Comorbidities Comorbidity 2    Comorbidities hx of back pain and surgery; prostate cancer; prostatectomy    Examination-Activity Limitations Toileting;Continence;Bed Mobility;Carry;Lift    Examination-Participation Restrictions Community Activity    Stability/Clinical Decision Making Evolving/Moderate complexity    Clinical Decision Making Moderate    Rehab Potential Excellent    PT Frequency 2x / week   might be only 1x/ week if 2 not needed   PT Duration --   16 weeks   PT Treatment/Interventions ADLs/Self Care Home Management;Biofeedback;Cryotherapy;Electrical Stimulation;Moist Heat;Therapeutic activities;Therapeutic exercise;Neuromuscular re-education;Patient/family education;Manual techniques;Dry needling;Taping    PT Next Visit Plan review initial HEP; log roll; walking program, progress HEP    PT Home Exercise Plan Access Code: [email or web address], toileting, prostatectomy info  Consulted and Agree  with Plan of Care Patient           Patient will benefit from skilled therapeutic intervention in order to improve the following deficits and impairments:  Pain,Postural dysfunction,Impaired flexibility,Increased fascial restricitons,Decreased strength,Decreased coordination,Decreased range of motion  Visit Diagnosis: Unspecified lack of coordination  Cramp and spasm     Problem List Patient Active Problem List   Diagnosis Date Noted  . Malignant neoplasm of prostate ([place]) 01/17/2020  . 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 Essential hypertension 08/12/2017  . Unilateral primary osteoarthritis, left knee 05/25/2016  . Unilateral primary osteoarthritis, right knee 05/25/2016    Jule Ser, PT 03/28/2020, 12:27 PM  [facility] [address] [place], [place], [address] Phone: [phone]   Fax:  [phone]  Name: Wayne [name] MRN: [id number] Date of Birth: 1960/03/27

## 2020-03-29 NOTE — Patient Instructions (Addendum)
DUE TO COVID-19 ONLY ONE VISITOR IS ALLOWED TO COME WITH YOU AND STAY IN THE WAITING ROOM ONLY DURING PRE OP AND PROCEDURE DAY OF SURGERY. THE 1 VISITOR  MAY VISIT WITH YOU AFTER SURGERY IN YOUR PRIVATE ROOM DURING VISITING HOURS ONLY!  YOU NEED TO HAVE A COVID 19 TEST ON__3/10_____ @__10 :00_____, THIS TEST MUST BE DONE BEFORE SURGERY,  COVID TESTING SITE [place] [address], IT IS ON THE RIGHT GOING OUT [address] APPROXIMATELY  2 MINUTES PAST [employer] ON THE RIGHT. ONCE YOUR COVID TEST IS COMPLETED,  PLEASE BEGIN THE QUARANTINE INSTRUCTIONS AS OUTLINED IN YOUR HANDOUT.                Wayne [name]    Your procedure is scheduled on: 04/04/20   Report to [facility] Main  Entrance   Report to admitting at 9:30 AM     Call this number if you have problems the morning of surgery [phone]    Remember: Do not eat food or drink liquids :After Midnight.   BRUSH YOUR TEETH MORNING OF SURGERY AND RINSE YOUR MOUTH OUT, NO CHEWING GUM CANDY OR MINTS.     Take these medicines the morning of surgery with A SIP OF WATER: Allopurinol. Call Dr. Janace Hoard Amlodipine                                You may not have any metal on your body including               piercings  Do not wear jewelry, lotions, powders or deodorant                      Men may shave face and neck.   Do not bring valuables to the hospital. [name].  Contacts, dentures or bridgework may not be worn into surgery.  Leave suitcase in the car. After surgery it may be brought to your room.               [name] - Preparing for Surgery Before surgery, you can play an important role.  Because skin is not sterile, your skin needs to be as free of germs as possible.  You can reduce the number of germs on your skin by washing with CHG (chlorahexidine gluconate) soap before surgery.  CHG is an antiseptic cleaner which kills germs  and bonds with the skin to continue killing germs even after washing. Please DO NOT use if you have an allergy to CHG or antibacterial soaps.  If your skin becomes reddened/irritated stop using the CHG and inform your nurse when you arrive at Short Stay.  You may shave your face/neck. Please follow these instructions carefully:  1.  Shower with CHG Soap the night before surgery and the  morning of Surgery.  2.  If you choose to wash your hair, wash your hair first as usual with your  normal  shampoo.  3.  After you shampoo, rinse your hair and body thoroughly to remove the  shampoo.  4.  Use CHG as you would any other liquid soap.  You can apply chg directly  to the skin and wash                       Gently with a scrungie or clean washcloth.  5.  Apply the CHG Soap to your body ONLY FROM THE NECK DOWN.   Do not use on face/ open                           Wound or open sores. Avoid contact with eyes, ears mouth and genitals (private parts).                       Wash face,  Genitals (private parts) with your normal soap.             6.  Wash thoroughly, paying special attention to the area where your surgery  will be performed.  7.  Thoroughly rinse your body with warm water from the neck down.  8.  DO NOT shower/wash with your normal soap after using and rinsing off  the CHG Soap.             9.  Pat yourself dry with a clean towel.            10.  Wear clean pajamas.            11.  Place clean sheets on your bed the night of your first shower and do not  sleep with pets. Day of Surgery : Do not apply any lotions/deodorants the morning of surgery.  Please wear clean clothes to the hospital/surgery center.  FAILURE TO FOLLOW THESE INSTRUCTIONS MAY RESULT IN THE CANCELLATION OF YOUR SURGERY PATIENT SIGNATURE_________________________________  NURSE  SIGNATURE__________________________________  ________________________________________________________________________

## 2020-03-30 ENCOUNTER — Encounter (HOSPITAL_COMMUNITY)
Admission: RE | Admit: 2020-03-30 | Discharge: 2020-03-30 | Disposition: A | Discharge status: Home / Self Care | Payer: Medicaid Other | Source: Ambulatory Visit | Attending: Urology | Admitting: Urology

## 2020-03-30 ENCOUNTER — Encounter (HOSPITAL_COMMUNITY): Payer: Self-pay

## 2020-03-30 ENCOUNTER — Other Ambulatory Visit: Payer: Self-pay

## 2020-03-30 DIAGNOSIS — Z01818 Encounter for other preprocedural examination: Secondary | ICD-10-CM | POA: Diagnosis not present

## 2020-03-30 LAB — BASIC METABOLIC PANEL
Anion gap: 14 (ref 5–15)
BUN: 12 mg/dL (ref 6–20)
CO2: 21 mmol/L — ABNORMAL LOW (ref 22–32)
Calcium: 9.4 mg/dL (ref 8.9–10.3)
Chloride: 106 mmol/L (ref 98–111)
Creatinine, Ser: 0.85 mg/dL (ref 0.61–1.24)
GFR, Estimated: >60 mL/min (ref >60)
Glucose, Bld: 105 mg/dL — ABNORMAL HIGH (ref 70–99)
Potassium: 3.6 mmol/L (ref 3.5–5.1)
Sodium: 141 mmol/L (ref 135–145)

## 2020-03-30 LAB — CBC
HCT: 43.9 % (ref 39.0–52.0)
Hemoglobin: 14.6 g/dL (ref 13.0–17.0)
MCH: 30 pg (ref 26.0–34.0)
MCHC: 33.3 g/dL (ref 30.0–36.0)
MCV: 90.3 fL (ref 80.0–100.0)
Platelets: 277 10*3/uL (ref 150–400)
RBC: 4.86 MIL/uL (ref 4.22–5.81)
RDW: 14.9 % (ref 11.5–15.5)
WBC: 6.2 10*3/uL (ref 4.0–10.5)
nRBC: 0 % (ref 0.0–0.2)

## 2020-03-30 NOTE — Progress Notes (Signed)
COVID Vaccine Completed:yes Date COVID Vaccine completed:09/19/19 COVID vaccine manufacturer: [employer]      PCP - Juluis Mire Cardiologist - none  Chest x-ray - no EKG - 03/30/20-chart , epic Stress Test - no ECHO - no Cardiac Cath -  Pacemaker/ICD noevice last checked:NA  Sleep Study - no CPAP -   Fasting Blood Sugar - NA Checks Blood Sugar _____ times a day  Blood Thinner Instructions:NA Aspirin Instructions: Last Dose:  Anesthesia review:   Patient denies shortness of breath, fever, cough and chest pain at PAT appointment yes Patient verbalized understanding of instructions that were given to them at the PAT appointment. Patient was also instructed that they will need to review over the PAT instructions again at home before surgery.yes Pt has no SOB with any activities. Pt has stopped his amlodipine because of " no refills" His BP was elevated at PAT 168/103 and 165/93. I told him to call his PCP and discuss if he should be continuing his amlodipine.

## 2020-03-31 ENCOUNTER — Other Ambulatory Visit (HOSPITAL_COMMUNITY)
Admission: RE | Admit: 2020-03-31 | Discharge: 2020-03-31 | Disposition: A | Discharge status: Home / Self Care | Payer: Medicaid Other | Source: Ambulatory Visit | Attending: Urology | Admitting: Urology

## 2020-03-31 DIAGNOSIS — Z20822 Contact with and (suspected) exposure to covid-19: Secondary | ICD-10-CM | POA: Insufficient documentation

## 2020-03-31 DIAGNOSIS — Z01812 Encounter for preprocedural laboratory examination: Secondary | ICD-10-CM | POA: Insufficient documentation

## 2020-03-31 LAB — SARS CORONAVIRUS 2 (TAT 6-24 HRS): SARS Coronavirus 2: NEGATIVE

## 2020-04-01 NOTE — H&P (Signed)
Office Visit Report     03/22/2020   --------------------------------------------------------------------------------   Wayne [name]  MRN: [id number]  DOB: December 09, 1960, 60 year old Male  SSN:    PRIMARY CARE:  Juluis Mire, NP  REFERRING:  MaryEllen D. Claudia Desanctis, MD  PROVIDER:  Jacalyn Lefevre, M.D.  TREATING:  Raynelle Bring, M.D.  LOCATION:  [facility] [phone]     --------------------------------------------------------------------------------   CC/HPI: CC: Prostate Cancer   Physician requesting consult: Dr. Jacalyn Lefevre  PCP: Juluis Mire, NP   Wayne [name] is a 60 year old gentleman who was found to have an elevated PSA of 113 in May 2021. He presented to Dr. Claudia Desanctis in November 2021 for evaluation and a repeat PSA was still quite elevated at 112. He underwent a TRUS biopsy of the prostate on 12/24/19 that confirmed Gleason 4+3=7 adenocarcinoma with 10 out of 12 biopsy cores positive for malignancy. He underwent extensive staging imaging including a bone scan, CT scan of the abdomen and pelvis, and PSMA PET scan all of which were negative for obvious metastatic disease.   Family history: None.   Imaging studies:  Bone scan (01/27/20): Uptake at L4-L5 and L2-L3 favored to be degenerative when correlated with CT scan. Questionable rib metastases at L 5th and R 4th ribs.  CT abdomen/pelvis (01/27/20): Negative for metastatic disease.  PSMA PET scan (03/03/20): Intense uptake in the right prostate but no evidence of metastatic disease.   PMH: He has a history of gout, hypertension, hyperlipidemia, and depression.  PSH: No abdominal surgeries.   TNM stage: cT1c N0 M0  PSA: 112  Gleason score: 4+3=7 (GG 3)  Biopsy (12/24/19): 10/12 cores positive  Left: L lateral apex (30%, 3+4=7), L apex (20%, 3+4=7), L lateral mid (20%, 4+3=7), L mid (20%, 3+4=7), L base (70%, 3+4=7)  Right: R apex (20%, 4+3=7), R lateral apex (50%, 3+4=7), R mid (90%, 4+3=7), R lateral mid  (90%, 4+3=7), R lateral base (80%, 3+4=7)  Prostate volume: 66 cc   Nomogram  OC disease: 7%  EPE: 95%  SVI: 28%  LNI: 64%  PFS (5 year, 10 year): 25%, 15%   Urinary function: IPSS is 3.  Erectile function: SHIM score is 11. He has previously used PDE 5 inhibitors with affected results but stop using these medications at of concern for potential side effects.     ALLERGIES: Hydralazine    MEDICATIONS: Allopurinol 300 mg tablet  Hydrochlorothiazide 25 mg tablet  Tamsulosin Hcl 0.4 mg capsule  Amlodipine Besylate 10 mg tablet  Atorvastatin Calcium 40 mg tablet  Bupropion Xl 150 mg tablet, extended release 24 hr  Losartan Potassium 50 mg tablet     GU PSH: Prostate Needle Biopsy - 12/24/2019     NON-GU PSH: Back Surgery (Unspecified) Surgical Pathology, Gross And Microscopic Examination For Prostate Needle - 12/24/2019     GU PMH: Prostate Cancer - 01/27/2020, Reviewed patient's pathology report and went over Gleason scoring and tumor stage. Based on PSA of 112 patient falls into the high risk prostate cancer category. We discussed that next steps are proceed with CT of the abdomen pelvis as well as bone scan for staging workup. Based on these results we can discuss if this is localized prostate cancer versus metastatic disease and then treat based on that. We did briefly discuss radiation therapy, ADT, and surgical intervention but again need more information. We discussed although his Gleason score is 7 he is high risk based on his PSA score  that is significantly elevated. He was given informational patient handout on prostate cancer as well as [facility] nomogram pre radical prostatectomy., - 01/07/2020 Elevated PSA - 12/24/2019, Patient's PSA significantly elevated at 113. He denies any recent catheter, UTI or trauma to the area. PSA has not been repeated. DRE does not reveal any concerning nodules. Will repeat PSA today. If remains elevated will proceed immediately with prostate biopsy.  Risks and benefits of prostate biopsy as well as has reformed discussed with the patient. I will call him as soon as I have the results., - 12/16/2019    NON-GU PMH: Arthritis Gout Hypercholesterolemia Hypertension    FAMILY HISTORY: None   SOCIAL HISTORY: Marital Status: Married Preferred Language: English; Ethnicity: Not Hispanic Or Latino; Race: Black or African American Current Smoking Status: Patient smokes. Smokes 1/2 pack per day.   Tobacco Use Assessment Completed: Used Tobacco in last 30 days? Drinks 4 drinks per day.  Does not drink caffeine.    REVIEW OF SYSTEMS:    GU Review Male:   Patient denies have to strain to urinate , burning/ pain with urination, stream starts and stops, frequent urination, leakage of urine, get up at night to urinate, hard to postpone urination, and trouble starting your streams.  Gastrointestinal (Lower):   Patient denies diarrhea and constipation.  Gastrointestinal (Upper):   Patient denies nausea and vomiting.  Constitutional:   Patient denies fever, night sweats, weight loss, and fatigue.  Skin:   Patient denies skin rash/ lesion and itching.  Eyes:   Patient denies blurred vision and double vision.  Ears/ Nose/ Throat:   Patient denies sore throat and sinus problems.  Hematologic/Lymphatic:   Patient denies swollen glands and easy bruising.  Cardiovascular:   Patient denies leg swelling and chest pains.  Respiratory:   Patient denies cough and shortness of breath.  Endocrine:   Patient denies excessive thirst.  Musculoskeletal:   Patient denies back pain and joint pain.  Neurological:   Patient denies headaches and dizziness.  Psychologic:   Patient denies depression and anxiety.   VITAL SIGNS:      03/22/2020 09:28 AM  Weight 250 lb / 113.4 kg  Height 75 in / 190.5 cm  BP 154/86 mmHg  Pulse 88 /min  Temperature 97.3 F / 36.2 C  BMI 31.2 kg/m   GU PHYSICAL EXAMINATION:    Prostate: Prostate about 40 grams. Left lobe normal  consistency, right lobe normal consistency. Symmetrical lobes. No prostate nodule. Left lobe no tenderness, right lobe no tenderness.    MULTI-SYSTEM PHYSICAL EXAMINATION:    Constitutional: Well-nourished. No physical deformities. Normally developed. Good grooming.  Respiratory: No labored breathing, no use of accessory muscles. Clear bilaterally.  Cardiovascular: Normal temperature, normal extremity pulses, no swelling, no varicosities. Regular rate and rhythm.  Gastrointestinal: No mass, no tenderness, no rigidity, mildly obese.     Complexity of Data:  Lab Test Review:   PSA  Records Review:   Pathology Reports, Previous Patient Records  X-Ray Review: PET Scan: Reviewed Films.  C.T. Abdomen/Pelvis: Reviewed Films.  Bone Scan: Reviewed Films.     12/16/19  PSA  Total PSA 112.00 ng/mL   Notes:                     CLINICAL DATA: New diagnosis prostate carcinoma. PSA equal 112.   EXAM:  NUCLEAR MEDICINE PET SKULL BASE TO THIGH   TECHNIQUE:  9.4 mCi F18 Piflufolastat (Pylarify) was injected intravenously.  Full-ring PET imaging  was performed from the skull base to thigh  after the radiotracer. CT data was obtained and used for attenuation  correction and anatomic localization.   COMPARISON: Whole-body bone scan 01/27/2020   FINDINGS:  NECK   No radiotracer activity in neck lymph nodes.   Incidental CT finding: None   CHEST   No radiotracer accumulation within mediastinal or hilar lymph nodes.  No suspicious pulmonary nodules on the CT scan.   Incidental CT finding: None   ABDOMEN/PELVIS   Prostate: Intense focal uptake in the RIGHT prostate gland with SUV  max equal 21.6. (Image 206).   Lymph nodes: No abnormal radiotracer accumulation within pelvic or  abdominal nodes.   Liver: No evidence of liver metastasis   Incidental CT finding: None   SKELETON   No focal activity to suggest skeletal metastasis.   IMPRESSION:  1. Focal activity in the RIGHT  prostate gland consistent with  high-grade carcinoma.  2. No evidence of metastatic lymphadenopathy in the pelvis or  periaortic retroperitoneum.  3. No visceral metastasis or skeletal metastasis.    Electronically Signed  By: Suzy Bouchard M.D.  On: 03/03/2020 14:19   CLINICAL DATA: Prostate cancer, PSA 112, BILATERAL knee pain for  several years, remote lumbar surgery   EXAM:  NUCLEAR MEDICINE WHOLE BODY BONE SCAN   TECHNIQUE:  Whole body anterior and posterior images were obtained approximately  3 hours after intravenous injection of radiopharmaceutical.   RADIOPHARMACEUTICALS: 19.8 mCi Technetium-12m MDP IV   COMPARISON: None   Radiographic correlation: CT abdomen and pelvis 01/27/2020   FINDINGS:  Uptake at shoulders, sternoclavicular joints, knees, typically  degenerative.   Uptake at RIGHT lateral lower lumbar spine L4-L5 and LEFT L2-L3  corresponding to degenerative changes on CT.   Questionable tiny foci of uptake at posterior ribs bilaterally, LEFT  fifth and RIGHT fourth, cannot exclude metastatic lesions.   No additional sites of worrisome osseous tracer accumulation are  identified.   Expected urinary tract and soft tissue distribution of tracer.   IMPRESSION:  Questionable foci of abnormal tracer uptake at the posterior LEFT  fifth and RIGHT fourth ribs, cannot exclude osseous metastases.   No additional worrisome scintigraphic abnormalities.    Electronically Signed  By: Lavonia Dana M.D.  On: 01/27/2020 15:02   CLINICAL DATA: New diagnosis of prostate cancer. Elevated PSA.  Current smoker.   EXAM:  CT ABDOMEN AND PELVIS WITHOUT CONTRAST   TECHNIQUE:  Multidetector CT imaging of the abdomen and pelvis was performed  following the standard protocol without IV contrast.   COMPARISON: None.   FINDINGS:  Lower chest: No significant pulmonary nodules or acute consolidative  airspace disease.   Hepatobiliary: Mild diffuse hepatic  steatosis. Normal liver size. No  definite liver surface irregularity. No liver masses. Normal  gallbladder with no radiopaque cholelithiasis. No biliary ductal  dilatation.   Pancreas: Normal, with no mass or duct dilation.   Spleen: Normal size. No mass.   Adrenals/Urinary Tract: Left adrenal 2.7 cm nodule with density 9  HU, compatible with an adenoma. No right adrenal nodules. No renal  stones. No hydronephrosis. No contour deforming renal masses. Normal  bladder.   Stomach/Bowel: Normal non-distended stomach. Normal caliber small  bowel with no small bowel wall thickening. Normal appendix. Normal  large bowel with no diverticulosis, large bowel wall thickening or  pericolonic fat stranding.   Vascular/Lymphatic: Atherosclerotic nonaneurysmal abdominal aorta.  No pathologically enlarged lymph nodes in the abdomen or pelvis.   Reproductive: Mildly enlarged prostate  without discrete prostatic  mass on this noncontrast CT.   Other: No pneumoperitoneum, ascites or focal fluid collection.   Musculoskeletal: No aggressive appearing focal osseous lesions.  Marked lower lumbar spondylosis. Mild patchy subchondral sclerosis  in the medial lower iliac bones bilaterally, favor degenerative  change.   IMPRESSION:  1. No evidence of metastatic disease in the abdomen or pelvis.  2. Mildly enlarged prostate.  3. Mild diffuse hepatic steatosis.  4. Left adrenal adenoma.  5. Aortic Atherosclerosis (ICD10-I70.0).    Electronically Signed  By: Ilona Sorrel M.D.  On: 01/27/2020 11:04   PROCEDURES:          Urinalysis - 81003 Dipstick Dipstick Cont'd  Color: Yellow Bilirubin: Neg mg/dL  Appearance: Clear Ketones: Neg mg/dL  Specific Gravity: 1.025 Blood: Neg ery/uL  pH: 7.0 Protein: Neg mg/dL  Glucose: Neg mg/dL Urobilinogen: 0.2 mg/dL    Nitrites: Neg    Leukocyte Esterase: Neg leu/uL    ASSESSMENT:      ICD-10 Details  1 GU:   Prostate Cancer - C61    PLAN:            Schedule Return Visit/Planned Activity: Other See Visit Notes             Note: Will call to finalize his eligibility for the clinical trial vs. Scheduling surgery immediately.  Return Visit/Planned Activity: Next Available Appointment - PT Referral             Note: Please schedule patient for preoperative PT prior to radical prostatectomy.            Document Letter(s):  Created for Patient: Clinical Summary         Notes:   1. High-risk prostate cancer: I had a detailed discussion with Wayne [name] regarding his prostate cancer situation. Considering the fact that his PSA may PET scan is negative for metastatic disease, although he has an extremely high PSA, he would be an appropriate candidate to consider curative therapy. As such, we discussed options including primary surgical therapy likely in the setting of multimodality treatment, external beam radiation therapy with long-term androgen deprivation, and the potential option of a clinical trial. The patient was counseled about the natural history of prostate cancer and the standard treatment options that are available for prostate cancer. It was explained to him how his age and life expectancy, clinical stage, Gleason score, and PSA affect his prognosis, the decision to proceed with additional staging studies, as well as how that information influences recommended treatment strategies. We discussed the roles for active surveillance, radiation therapy, surgical therapy, androgen deprivation, as well as ablative therapy options for the treatment of prostate cancer as appropriate to his individual cancer situation. We discussed the risks and benefits of these options with regard to their impact on cancer control and also in terms of potential adverse events, complications, and impact on quality of life particularly related to urinary and sexual function. The patient was encouraged to ask questions throughout the discussion today and all questions were  answered to his stated satisfaction. In addition, the patient was provided with and/or directed to appropriate resources and literature for further education about prostate cancer and treatment options. We discussed surgical therapy for prostate cancer including the different available surgical approaches. We discussed, in detail, the risks and expectations of surgery with regard to cancer control, urinary control, and erectile function as well as the expected postoperative recovery process. Additional risks of surgery including but not limited to  bleeding, infection, hernia formation, nerve damage, lymphocele formation, bowel/rectal injury potentially necessitating colostomy, damage to the urinary tract resulting in urine leakage, urethral stricture, and the cardiopulmonary risks such as myocardial infarction, stroke, death, venothromboembolism, etc. were explained. The risk of open surgical conversion for robotic/laparoscopic prostatectomy was also discussed.   He is most interested in being quite aggressive about treatment of his prostate cancer would like to proceed with surgical therapy. We are going to check to see if he meets criteria for enrollment in [employer]. If eligible, he is interested. If not, he does wish to proceed with a non nerve-sparing robot assisted laparoscopic radical prostatectomy and pelvic lymphadenectomy. We will confirm his eligibility for the trial later today and make a final decision.   Cc: Rowan Blase, NP  Dr. Jacalyn Lefevre     E & M CODES: We spent 62 minutes dedicated to evaluation and management time, including face to face interaction, discussions on coordination of care, documentation, result review, and discussion with others as applicable.     * Signed by Raynelle Bring, M.D. on 03/22/20 at 12:05 PM (EST)*

## 2020-04-04 ENCOUNTER — Encounter (HOSPITAL_COMMUNITY)
Admission: RE | Disposition: A | Discharge status: Home / Self Care | Payer: Self-pay | Source: Home / Self Care | Attending: Urology

## 2020-04-04 ENCOUNTER — Ambulatory Visit (HOSPITAL_COMMUNITY): Payer: Medicaid Other | Admitting: Certified Registered Nurse Anesthetist

## 2020-04-04 ENCOUNTER — Encounter (HOSPITAL_COMMUNITY): Payer: Self-pay | Admitting: Urology

## 2020-04-04 ENCOUNTER — Other Ambulatory Visit: Payer: Self-pay

## 2020-04-04 ENCOUNTER — Observation Stay (HOSPITAL_COMMUNITY)
Admission: RE | Admit: 2020-04-04 | Discharge: 2020-04-05 | Disposition: A | Discharge status: Home / Self Care | Payer: Medicaid Other | Attending: Urology | Admitting: Urology

## 2020-04-04 DIAGNOSIS — C61 Malignant neoplasm of prostate: Principal | ICD-10-CM | POA: Diagnosis present

## 2020-04-04 DIAGNOSIS — M170 Bilateral primary osteoarthritis of knee: Secondary | ICD-10-CM | POA: Diagnosis not present

## 2020-04-04 DIAGNOSIS — F17210 Nicotine dependence, cigarettes, uncomplicated: Secondary | ICD-10-CM | POA: Diagnosis not present

## 2020-04-04 DIAGNOSIS — I10 Essential (primary) hypertension: Secondary | ICD-10-CM | POA: Insufficient documentation

## 2020-04-04 DIAGNOSIS — M10349 Gout due to renal impairment, unspecified hand: Secondary | ICD-10-CM | POA: Diagnosis not present

## 2020-04-04 HISTORY — PX: LYMPHADENECTOMY: SHX5960

## 2020-04-04 HISTORY — PX: ROBOT ASSISTED LAPAROSCOPIC RADICAL PROSTATECTOMY: SHX5141

## 2020-04-04 LAB — TYPE AND SCREEN
ABO/RH(D): A POS
Antibody Screen: NEGATIVE

## 2020-04-04 LAB — HEMOGLOBIN AND HEMATOCRIT, BLOOD
HCT: 44.4 % (ref 39.0–52.0)
Hemoglobin: 14.4 g/dL (ref 13.0–17.0)

## 2020-04-04 LAB — ABO/RH: ABO/RH(D): A POS

## 2020-04-04 SURGERY — XI ROBOTIC ASSISTED LAPAROSCOPIC RADICAL PROSTATECTOMY LEVEL 2
Anesthesia: General

## 2020-04-04 MED ORDER — HYDROCHLOROTHIAZIDE 25 MG PO TABS
25.0000 mg | ORAL_TABLET | Freq: Every day | ORAL | Status: DC
  Administered 2020-04-04 – 2020-04-05 (×2): 25 mg via ORAL
  Filled 2020-04-04 (×2): qty 1

## 2020-04-04 MED ORDER — SULFAMETHOXAZOLE-TRIMETHOPRIM 800-160 MG PO TABS: 1.0000 | ORAL_TABLET | Freq: Two times a day (BID) | ORAL | 0 refills | Status: DC

## 2020-04-04 MED ORDER — MEPERIDINE HCL 50 MG/ML IJ SOLN
INTRAMUSCULAR | Status: AC
  Filled 2020-04-04: qty 1

## 2020-04-04 MED ORDER — LABETALOL HCL 5 MG/ML IV SOLN
INTRAVENOUS | Status: DC | PRN
  Administered 2020-04-04: 10 mg via INTRAVENOUS

## 2020-04-04 MED ORDER — ONDANSETRON HCL 4 MG/2ML IJ SOLN: 4.0000 mg | INTRAMUSCULAR | Status: DC | PRN

## 2020-04-04 MED ORDER — ORAL CARE MOUTH RINSE: 15.0000 mL | Freq: Once | OROMUCOSAL | Status: AC

## 2020-04-04 MED ORDER — CHLORHEXIDINE GLUCONATE 0.12 % MT SOLN
15.0000 mL | Freq: Once | OROMUCOSAL | Status: AC
  Administered 2020-04-04: 15 mL via OROMUCOSAL

## 2020-04-04 MED ORDER — DOCUSATE SODIUM 100 MG PO CAPS
100.0000 mg | ORAL_CAPSULE | Freq: Two times a day (BID) | ORAL | Status: DC
  Administered 2020-04-04 – 2020-04-05 (×2): 100 mg via ORAL
  Filled 2020-04-04 (×2): qty 1

## 2020-04-04 MED ORDER — MIDAZOLAM HCL 5 MG/5ML IJ SOLN
INTRAMUSCULAR | Status: DC | PRN
  Administered 2020-04-04: 2 mg via INTRAVENOUS

## 2020-04-04 MED ORDER — ROCURONIUM BROMIDE 10 MG/ML (PF) SYRINGE
PREFILLED_SYRINGE | INTRAVENOUS | Status: DC | PRN
  Administered 2020-04-04: 20 mg via INTRAVENOUS
  Administered 2020-04-04: 10 mg via INTRAVENOUS
  Administered 2020-04-04: 100 mg via INTRAVENOUS

## 2020-04-04 MED ORDER — CEFAZOLIN SODIUM-DEXTROSE 1-4 GM/50ML-% IV SOLN
1.0000 g | Freq: Three times a day (TID) | INTRAVENOUS | Status: AC
  Administered 2020-04-04 – 2020-04-05 (×2): 1 g via INTRAVENOUS
  Filled 2020-04-04 (×2): qty 50

## 2020-04-04 MED ORDER — ONDANSETRON HCL 4 MG/2ML IJ SOLN
INTRAMUSCULAR | Status: DC | PRN
  Administered 2020-04-04: 4 mg via INTRAVENOUS

## 2020-04-04 MED ORDER — STERILE WATER FOR IRRIGATION IR SOLN
Status: DC | PRN
  Administered 2020-04-04: 1000 mL

## 2020-04-04 MED ORDER — KETOROLAC TROMETHAMINE 15 MG/ML IJ SOLN
15.0000 mg | Freq: Four times a day (QID) | INTRAMUSCULAR | Status: DC
  Administered 2020-04-04 – 2020-04-05 (×4): 15 mg via INTRAVENOUS
  Filled 2020-04-04 (×4): qty 1

## 2020-04-04 MED ORDER — SUGAMMADEX SODIUM 200 MG/2ML IV SOLN
INTRAVENOUS | Status: DC | PRN
  Administered 2020-04-04: 400 mg via INTRAVENOUS

## 2020-04-04 MED ORDER — LIDOCAINE 2% (20 MG/ML) 5 ML SYRINGE
INTRAMUSCULAR | Status: AC
  Filled 2020-04-04: qty 5

## 2020-04-04 MED ORDER — HYDROMORPHONE HCL 1 MG/ML IJ SOLN: 0.2500 mg | INTRAMUSCULAR | Status: DC | PRN

## 2020-04-04 MED ORDER — MAGNESIUM CITRATE PO SOLN: 1.0000 | Freq: Once | ORAL | Status: DC

## 2020-04-04 MED ORDER — AMISULPRIDE (ANTIEMETIC) 5 MG/2ML IV SOLN: 10.0000 mg | Freq: Once | INTRAVENOUS | Status: DC | PRN

## 2020-04-04 MED ORDER — LACTATED RINGERS IV SOLN: INTRAVENOUS | Status: DC | PRN

## 2020-04-04 MED ORDER — CEFAZOLIN SODIUM-DEXTROSE 2-4 GM/100ML-% IV SOLN
2.0000 g | Freq: Once | INTRAVENOUS | Status: AC
  Administered 2020-04-04: 2 g via INTRAVENOUS
  Filled 2020-04-04: qty 100

## 2020-04-04 MED ORDER — OXYCODONE HCL 5 MG/5ML PO SOLN: 5.0000 mg | Freq: Once | ORAL | Status: DC | PRN

## 2020-04-04 MED ORDER — FLEET ENEMA 7-19 GM/118ML RE ENEM: 1.0000 | ENEMA | Freq: Once | RECTAL | Status: DC

## 2020-04-04 MED ORDER — OXYCODONE HCL 5 MG PO TABS: 5.0000 mg | ORAL_TABLET | Freq: Once | ORAL | Status: DC | PRN

## 2020-04-04 MED ORDER — ROCURONIUM BROMIDE 10 MG/ML (PF) SYRINGE
PREFILLED_SYRINGE | INTRAVENOUS | Status: AC
  Filled 2020-04-04: qty 10

## 2020-04-04 MED ORDER — BACITRACIN-NEOMYCIN-POLYMYXIN 400-5-5000 EX OINT: 1.0000 "application " | TOPICAL_OINTMENT | Freq: Three times a day (TID) | CUTANEOUS | Status: DC | PRN

## 2020-04-04 MED ORDER — PROPOFOL 10 MG/ML IV BOLUS
INTRAVENOUS | Status: DC | PRN
  Administered 2020-04-04: 200 mg via INTRAVENOUS

## 2020-04-04 MED ORDER — FENTANYL CITRATE (PF) 250 MCG/5ML IJ SOLN
INTRAMUSCULAR | Status: AC
  Filled 2020-04-04: qty 5

## 2020-04-04 MED ORDER — MIDAZOLAM HCL 2 MG/2ML IJ SOLN
INTRAMUSCULAR | Status: AC
  Filled 2020-04-04: qty 2

## 2020-04-04 MED ORDER — MORPHINE SULFATE (PF) 2 MG/ML IV SOLN
2.0000 mg | INTRAVENOUS | Status: DC | PRN
  Administered 2020-04-04 – 2020-04-05 (×2): 2 mg via INTRAVENOUS
  Filled 2020-04-04 (×2): qty 1

## 2020-04-04 MED ORDER — KCL IN DEXTROSE-NACL 20-5-0.45 MEQ/L-%-% IV SOLN
INTRAVENOUS | Status: DC
  Filled 2020-04-04 (×3): qty 1000

## 2020-04-04 MED ORDER — ONDANSETRON HCL 4 MG/2ML IJ SOLN
INTRAMUSCULAR | Status: AC
  Filled 2020-04-04: qty 2

## 2020-04-04 MED ORDER — FENTANYL CITRATE (PF) 100 MCG/2ML IJ SOLN
INTRAMUSCULAR | Status: AC
  Filled 2020-04-04: qty 2

## 2020-04-04 MED ORDER — FENTANYL CITRATE (PF) 100 MCG/2ML IJ SOLN
INTRAMUSCULAR | Status: DC | PRN
  Administered 2020-04-04 (×4): 50 ug via INTRAVENOUS
  Administered 2020-04-04: 100 ug via INTRAVENOUS
  Administered 2020-04-04: 50 ug via INTRAVENOUS
  Administered 2020-04-04: 100 ug via INTRAVENOUS

## 2020-04-04 MED ORDER — BUPIVACAINE-EPINEPHRINE (PF) 0.25% -1:200000 IJ SOLN
INTRAMUSCULAR | Status: AC
  Filled 2020-04-04: qty 30

## 2020-04-04 MED ORDER — HEPARIN SODIUM (PORCINE) 1000 UNIT/ML IJ SOLN
INTRAMUSCULAR | Status: AC
  Filled 2020-04-04: qty 1

## 2020-04-04 MED ORDER — MEPERIDINE HCL 50 MG/ML IJ SOLN
6.2500 mg | INTRAMUSCULAR | Status: DC | PRN
Start: 2020-04-04 — End: 2020-04-04
  Administered 2020-04-04: 12.5 mg via INTRAVENOUS

## 2020-04-04 MED ORDER — DIPHENHYDRAMINE HCL 50 MG/ML IJ SOLN
12.5000 mg | Freq: Four times a day (QID) | INTRAMUSCULAR | Status: DC | PRN
Start: 2020-04-04 — End: 2020-04-05

## 2020-04-04 MED ORDER — TRAMADOL HCL 50 MG PO TABS: 50.0000 mg | ORAL_TABLET | Freq: Four times a day (QID) | ORAL | 0 refills | Status: DC | PRN

## 2020-04-04 MED ORDER — BUPIVACAINE-EPINEPHRINE 0.25% -1:200000 IJ SOLN
INTRAMUSCULAR | Status: DC | PRN
  Administered 2020-04-04: 30 mL

## 2020-04-04 MED ORDER — LACTATED RINGERS IV SOLN: INTRAVENOUS | Status: DC

## 2020-04-04 MED ORDER — LOSARTAN POTASSIUM 50 MG PO TABS
100.0000 mg | ORAL_TABLET | Freq: Every day | ORAL | Status: DC
  Administered 2020-04-04 – 2020-04-05 (×2): 100 mg via ORAL
  Filled 2020-04-04 (×2): qty 2

## 2020-04-04 MED ORDER — SODIUM CHLORIDE 0.9 % IV BOLUS: 1000.0000 mL | Freq: Once | INTRAVENOUS | Status: DC

## 2020-04-04 MED ORDER — ACETAMINOPHEN 325 MG PO TABS
650.0000 mg | ORAL_TABLET | ORAL | Status: DC | PRN
  Administered 2020-04-05: 650 mg via ORAL
  Filled 2020-04-04: qty 2

## 2020-04-04 MED ORDER — ZOLPIDEM TARTRATE 5 MG PO TABS: 5.0000 mg | ORAL_TABLET | Freq: Every evening | ORAL | Status: DC | PRN

## 2020-04-04 MED ORDER — LIDOCAINE 2% (20 MG/ML) 5 ML SYRINGE
INTRAMUSCULAR | Status: DC | PRN
  Administered 2020-04-04: 50 mg via INTRAVENOUS

## 2020-04-04 MED ORDER — ATORVASTATIN CALCIUM 40 MG PO TABS
80.0000 mg | ORAL_TABLET | Freq: Every day | ORAL | Status: DC
  Administered 2020-04-04 – 2020-04-05 (×2): 80 mg via ORAL
  Filled 2020-04-04 (×2): qty 2

## 2020-04-04 MED ORDER — SODIUM CHLORIDE 0.9 % IR SOLN
Status: DC | PRN
  Administered 2020-04-04: 1000 mL

## 2020-04-04 MED ORDER — CHLORHEXIDINE GLUCONATE CLOTH 2 % EX PADS
6.0000 | MEDICATED_PAD | Freq: Every day | CUTANEOUS | Status: DC
  Administered 2020-04-05: 6 via TOPICAL

## 2020-04-04 MED ORDER — ALLOPURINOL 300 MG PO TABS
300.0000 mg | ORAL_TABLET | Freq: Every day | ORAL | Status: DC
  Administered 2020-04-04 – 2020-04-05 (×2): 300 mg via ORAL
  Filled 2020-04-04 (×2): qty 1

## 2020-04-04 MED ORDER — PROMETHAZINE HCL 25 MG/ML IJ SOLN
6.2500 mg | INTRAMUSCULAR | Status: DC | PRN
Start: 2020-04-04 — End: 2020-04-04

## 2020-04-04 MED ORDER — DIPHENHYDRAMINE HCL 12.5 MG/5ML PO ELIX
12.5000 mg | ORAL_SOLUTION | Freq: Four times a day (QID) | ORAL | Status: DC | PRN
Start: 2020-04-04 — End: 2020-04-05

## 2020-04-04 MED ORDER — LOSARTAN POTASSIUM 50 MG PO TABS: 100.0000 mg | ORAL_TABLET | Freq: Every day | ORAL | Status: DC

## 2020-04-04 MED ORDER — DEXAMETHASONE SODIUM PHOSPHATE 4 MG/ML IJ SOLN
INTRAMUSCULAR | Status: DC | PRN
  Administered 2020-04-04: 10 mg via INTRAVENOUS

## 2020-04-04 MED ORDER — BELLADONNA ALKALOIDS-OPIUM 16.2-60 MG RE SUPP
1.0000 | Freq: Four times a day (QID) | RECTAL | Status: DC | PRN
Start: 2020-04-04 — End: 2020-04-05

## 2020-04-04 SURGICAL SUPPLY — 62 items
ADH SKN CLS APL DERMABOND .7 (GAUZE/BANDAGES/DRESSINGS) ×2
APL PRP STRL LF DISP 70% ISPRP (MISCELLANEOUS) ×2
APL SWBSTK 6 STRL LF DISP (MISCELLANEOUS) ×2
APPLICATOR COTTON TIP 6 STRL (MISCELLANEOUS) ×2 IMPLANT
APPLICATOR COTTON TIP 6IN STRL (MISCELLANEOUS) ×3
CATH FOLEY 2WAY SLVR 18FR 30CC (CATHETERS) ×3 IMPLANT
CATH ROBINSON RED A/P 16FR (CATHETERS) ×3 IMPLANT
CATH ROBINSON RED A/P 8FR (CATHETERS) ×3 IMPLANT
CATH TIEMANN FOLEY 18FR 5CC (CATHETERS) ×3 IMPLANT
CHLORAPREP W/TINT 26 (MISCELLANEOUS) ×3 IMPLANT
CLIP VESOLOCK LG 6/CT PURPLE (CLIP) ×6 IMPLANT
COVER SURGICAL LIGHT HANDLE (MISCELLANEOUS) ×3 IMPLANT
COVER TIP SHEARS 8 DVNC (MISCELLANEOUS) ×2 IMPLANT
COVER TIP SHEARS 8MM DA VINCI (MISCELLANEOUS) ×3
COVER WAND RF STERILE (DRAPES) IMPLANT
CUTTER ECHEON FLEX ENDO 45 340 (ENDOMECHANICALS) ×3 IMPLANT
DECANTER SPIKE VIAL GLASS SM (MISCELLANEOUS) ×3 IMPLANT
DERMABOND ADVANCED (GAUZE/BANDAGES/DRESSINGS) ×1
DERMABOND ADVANCED .7 DNX12 (GAUZE/BANDAGES/DRESSINGS) ×2 IMPLANT
DRAIN CHANNEL RND F F (WOUND CARE) IMPLANT
DRAPE ARM DVNC X/XI (DISPOSABLE) ×8 IMPLANT
DRAPE COLUMN DVNC XI (DISPOSABLE) ×2 IMPLANT
DRAPE DA VINCI XI ARM (DISPOSABLE) ×12
DRAPE DA VINCI XI COLUMN (DISPOSABLE) ×3
DRAPE SURG IRRIG POUCH 19X23 (DRAPES) ×3 IMPLANT
DRSG TEGADERM 4X4.75 (GAUZE/BANDAGES/DRESSINGS) ×3 IMPLANT
ELECT PENCIL ROCKER SW 15FT (MISCELLANEOUS) ×3 IMPLANT
ELECT REM PT RETURN 15FT ADLT (MISCELLANEOUS) ×3 IMPLANT
GLOVE SURG ENC MOIS LTX SZ6.5 (GLOVE) ×3 IMPLANT
GLOVE SURG ENC TEXT LTX SZ7.5 (GLOVE) ×6 IMPLANT
GOWN STRL REUS W/TWL LRG LVL3 (GOWN DISPOSABLE) ×9 IMPLANT
HOLDER FOLEY CATH W/STRAP (MISCELLANEOUS) ×3 IMPLANT
IRRIG SUCT STRYKERFLOW 2 WTIP (MISCELLANEOUS) ×3
IRRIGATION SUCT STRKRFLW 2 WTP (MISCELLANEOUS) ×2 IMPLANT
IV LACTATED RINGERS 1000ML (IV SOLUTION) ×3 IMPLANT
KIT TURNOVER KIT A (KITS) ×3 IMPLANT
NDL SAFETY ECLIPSE 18X1.5 (NEEDLE) ×2 IMPLANT
NEEDLE HYPO 18GX1.5 SHARP (NEEDLE) ×3
PACK ROBOT UROLOGY CUSTOM (CUSTOM PROCEDURE TRAY) ×3 IMPLANT
PENCIL SMOKE EVACUATOR (MISCELLANEOUS) IMPLANT
RELOAD STAPLE 45 4.1 GRN THCK (STAPLE) ×2 IMPLANT
SEAL CANN UNIV 5-8 DVNC XI (MISCELLANEOUS) ×8 IMPLANT
SEAL XI 5MM-8MM UNIVERSAL (MISCELLANEOUS) ×12
SET TUBE SMOKE EVAC HIGH FLOW (TUBING) ×3 IMPLANT
SOLUTION ELECTROLUBE (MISCELLANEOUS) ×3 IMPLANT
STAPLE RELOAD 45 GRN (STAPLE) ×2 IMPLANT
STAPLE RELOAD 45MM GREEN (STAPLE) ×3
SUT ETHILON 3 0 PS 1 (SUTURE) ×3 IMPLANT
SUT MNCRL 3 0 RB1 (SUTURE) ×2 IMPLANT
SUT MNCRL 3 0 VIOLET RB1 (SUTURE) ×2 IMPLANT
SUT MNCRL AB 4-0 PS2 18 (SUTURE) ×6 IMPLANT
SUT MONOCRYL 3 0 RB1 (SUTURE) ×6
SUT VIC AB 0 CT1 27 (SUTURE) ×3
SUT VIC AB 0 CT1 27XBRD ANTBC (SUTURE) ×2 IMPLANT
SUT VIC AB 0 UR5 27 (SUTURE) ×3 IMPLANT
SUT VIC AB 2-0 SH 27 (SUTURE) ×6
SUT VIC AB 2-0 SH 27X BRD (SUTURE) ×2 IMPLANT
SUT VICRYL 0 UR6 27IN ABS (SUTURE) ×8 IMPLANT
SYR 27GX1/2 1ML LL SAFETY (SYRINGE) ×3 IMPLANT
TOWEL OR NON WOVEN STRL DISP B (DISPOSABLE) ×3 IMPLANT
TROCAR XCEL NON-BLD 5MMX100MML (ENDOMECHANICALS) IMPLANT
WATER STERILE IRR 1000ML POUR (IV SOLUTION) ×3 IMPLANT

## 2020-04-04 NOTE — Transfer of Care (Signed)
Immediate Anesthesia Transfer of Care Note  Patient: Wayne [name]  Procedure(s) Performed: XI ROBOTIC ASSISTED LAPAROSCOPIC RADICAL PROSTATECTOMY LEVEL 2 (N/A ) LYMPHADENECTOMY, PELVIC (Bilateral )  Patient Location: PACU  Anesthesia Type:General  Level of Consciousness: awake, alert , oriented and patient cooperative  Airway & Oxygen Therapy: Patient Spontanous Breathing and Patient connected to face mask  Post-op Assessment: Report given to RN and Post -op Vital signs reviewed and stable  Post vital signs: Reviewed and stable  Last Vitals:  Vitals Value Taken Time  BP 184/102 04/04/20 1415  Temp    Pulse 60 04/04/20 1418  Resp 21 04/04/20 1418  SpO2 100 % 04/04/20 1418  Vitals shown include unvalidated device data.  Last Pain:  Vitals:   04/04/20 0940  TempSrc: Oral  PainSc: 0-No pain         Complications: No complications documented.

## 2020-04-04 NOTE — Interval H&P Note (Signed)
History and Physical Interval Note:  04/04/2020 10:05 AM  Wayne [name]  has presented today for surgery, with the diagnosis of PROSTATE CANCER.  The various methods of treatment have been discussed with the patient and family. After consideration of risks, benefits and other options for treatment, the patient has consented to  Procedure(s): XI ROBOTIC ASSISTED LAPAROSCOPIC RADICAL PROSTATECTOMY LEVEL 2 (N/A) LYMPHADENECTOMY, PELVIC (Bilateral) as a surgical intervention.  The patient's history has been reviewed, patient examined, no change in status, stable for surgery.  I have reviewed the patient's chart and labs.  Questions were answered to the patient's satisfaction.     [employer]

## 2020-04-04 NOTE — Op Note (Signed)
Preoperative diagnosis: Clinically localized adenocarcinoma of the prostate (clinical stage T1c N0 M0)  Postoperative diagnosis: Clinically localized adenocarcinoma of the prostate (clinical stage T1c N0 M0)  Procedure:  1. Robotic assisted laparoscopic radical prostatectomy (non nerve sparing) 2. Bilateral robotic assisted laparoscopic pelvic lymphadenectomy  Surgeon: Pryor Curia. M.D.  Assistant(s): Debbrah Alar, PA-C  An assistant was required for this surgical procedure.  The duties of the assistant included but were not limited to suctioning, passing suture, camera manipulation, retraction. This procedure would not be able to be performed without an Environmental consultant.  Resident: Dr. Celene Squibb  Anesthesia: General  Complications: None  EBL: 50 mL  IVF:  1000 mL crystalloid  Specimens: 1. Prostate and seminal vesicles 2. Right pelvic lymph nodes 3. Left pelvic lymph nodes  Disposition of specimens: Pathology  Drains: 1. 20 Fr coude catheter 2. # 19 Blake pelvic drain  Indication: Wayne [name] is a 60 y.o. year old patient with clinically localized prostate cancer.  After a thorough review of the management options for treatment of prostate cancer, he elected to proceed with surgical therapy and the above procedure(s).  We have discussed the potential benefits and risks of the procedure, side effects of the proposed treatment, the likelihood of the patient achieving the goals of the procedure, and any potential problems that might occur during the procedure or recuperation. Informed consent has been obtained.  Description of procedure:  The patient was taken to the operating room and a general anesthetic was administered. He was given preoperative antibiotics, placed in the dorsal lithotomy position, and prepped and draped in the usual sterile fashion. Next a preoperative timeout was performed. A urethral catheter was placed into the bladder and a site was  selected near the umbilicus for placement of the camera port. This was placed using a standard open Hassan technique which allowed entry into the peritoneal cavity under direct vision and without difficulty. A 12 mm port was placed and a pneumoperitoneum established. The camera was then used to inspect the abdomen and there was no evidence of any intra-abdominal injuries or other abnormalities. The remaining abdominal ports were then placed. 8 mm robotic ports were placed in the right lower quadrant, left lower quadrant, and far left lateral abdominal wall. A 5 mm port was placed in the right upper quadrant and a 12 mm port was placed in the right lateral abdominal wall for laparoscopic assistance. All ports were placed under direct vision without difficulty. The surgical cart was then docked.   Utilizing the cautery scissors, the bladder was reflected posteriorly allowing entry into the space of Retzius and identification of the endopelvic fascia and prostate. The periprostatic fat was then removed from the prostate allowing full exposure of the endopelvic fascia. The endopelvic fascia was then incised from the apex back to the base of the prostate bilaterally and the underlying levator muscle fibers were swept laterally off the prostate thereby isolating the dorsal venous complex. The dorsal vein was then stapled and divided with a 45 mm Flex Echelon stapler. Attention then turned to the bladder neck which was divided anteriorly thereby allowing entry into the bladder and exposure of the urethral catheter. The catheter balloon was deflated and the catheter was brought into the operative field and used to retract the prostate anteriorly. The posterior bladder neck was then examined and was divided allowing further dissection between the bladder and prostate posteriorly until the vasa deferentia and seminal vessels were identified. The vasa deferentia were isolated,  divided, and lifted anteriorly. The seminal  vesicles were dissected down to their tips with care to control the seminal vascular arterial blood supply. These structures were then lifted anteriorly and the space between Denonvillier's fascia and the anterior rectum was developed with a combination of sharp and blunt dissection. This isolated the vascular pedicles of the prostate.  A wide non nerve sparing dissection was performed with Weck clips used to ligate the vascular pedicles of the prostate bilaterally. The vascular pedicles of the prostate were then divided.  The urethra was then sharply transected allowing the prostate specimen to be disarticulated. The pelvis was copiously irrigated and hemostasis was ensured. There was no evidence for rectal injury.  Attention then turned to the right pelvic sidewall. The fibrofatty tissue between the external iliac vein, confluence of the iliac vessels, hypogastric artery, and Cooper's ligament was dissected free from the pelvic sidewall with care to preserve the obturator nerve. Weck clips were used for lymphostasis and hemostasis. An identical procedure was performed on the contralateral side and the lymphatic packets were removed for permanent pathologic analysis.  Attention then turned to the urethral anastomosis. A 2-0 Vicryl slip knot was placed between Denonvillier's fascia, the posterior bladder neck, and the posterior urethra to reapproximate these structures. A double-armed 3-0 Monocryl suture was then used to perform a 360 running tension-free anastomosis between the bladder neck and urethra. A new urethral catheter was then placed into the bladder and irrigated. There were no blood clots within the bladder and the anastomosis appeared to be watertight. A #19 Blake drain was then brought through the left lateral 8 mm port site and positioned appropriately within the pelvis. It was secured to the skin with a nylon suture. The surgical cart was then undocked. The right lateral 12 mm port site was  closed at the fascial level with a 0 Vicryl suture placed laparoscopically. All remaining ports were then removed under direct vision. The prostate specimen was removed intact within the Endopouch retrieval bag via the periumbilical camera port site. This fascial opening was closed with two running 0 Vicryl sutures. 0.25% Marcaine was then injected into all port sites and all incisions were reapproximated at the skin level with 3-0 Monocryl subcuticular sutures. Dermabond was applied to the skin. The patient appeared to tolerate the procedure well and without complications. The patient was able to be extubated and transferred to the recovery unit in satisfactory condition.  Pryor Curia MD

## 2020-04-04 NOTE — Anesthesia Postprocedure Evaluation (Signed)
Anesthesia Post Note  Patient: Wayne [name]  Procedure(s) Performed: XI ROBOTIC ASSISTED LAPAROSCOPIC RADICAL PROSTATECTOMY LEVEL 2 (N/A ) LYMPHADENECTOMY, PELVIC (Bilateral )     Patient location during evaluation: PACU Anesthesia Type: General Level of consciousness: awake and alert Pain management: pain level controlled Vital Signs Assessment: post-procedure vital signs reviewed and stable Respiratory status: spontaneous breathing, nonlabored ventilation and respiratory function stable Cardiovascular status: blood pressure returned to baseline and stable Postop Assessment: no apparent nausea or vomiting Anesthetic complications: no   No complications documented.  Last Vitals:  Vitals:   04/04/20 1500 04/04/20 1525  BP: (!) 147/89 133/78  Pulse: (!) 57 (!) 57  Resp: 20 18  Temp: 36.7 C 36.8 C  SpO2: 98% 99%    Last Pain:  Vitals:   04/04/20 1528  TempSrc:   PainSc: 3                  Lynda Rainwater

## 2020-04-04 NOTE — Anesthesia Procedure Notes (Signed)
Procedure Name: Intubation Date/Time: 04/04/2020 10:58 AM Performed by: Claudia Desanctis, CRNA Pre-anesthesia Checklist: Patient identified, Emergency Drugs available, Suction available and Patient being monitored Patient Re-evaluated:Patient Re-evaluated prior to induction Oxygen Delivery Method: Circle system utilized Preoxygenation: Pre-oxygenation with 100% oxygen Induction Type: IV induction Ventilation: Mask ventilation without difficulty Laryngoscope Size: 2 and Bonser Grade View: Grade I Tube type: Oral Tube size: 8.0 mm Number of attempts: 1 Airway Equipment and Method: Stylet Placement Confirmation: ETT inserted through vocal cords under direct vision,  positive ETCO2 and breath sounds checked- equal and bilateral Secured at: 23 cm Tube secured with: Tape Dental Injury: Teeth and Oropharynx as per pre-operative assessment

## 2020-04-04 NOTE — Progress Notes (Signed)
Patient ID: Wayne [name], male   DOB: 1961-01-19, 60 y.o.   MRN: [id number]  Post-op note  Subjective: The patient is doing well.  No complaints.  Objective: Vital signs in last 24 hours: Temp:  [98.1 F (36.7 C)-98.7 F (37.1 C)] 98.2 F (36.8 C) (03/14 1525) Pulse Rate:  [56-85] 57 (03/14 1525) Resp:  [16-20] 18 (03/14 1525) BP: (133-172)/(78-99) 133/78 (03/14 1525) SpO2:  [98 %-100 %] 99 % (03/14 1525) Weight:  [115.2 kg] 115.2 kg (03/14 0940)  Intake/Output from previous day: No intake/output data recorded. Intake/Output this shift: Total I/O In: 100 [IV Piggyback:100] Out: 100 [Blood:100]  Physical Exam:  General: Alert and oriented. Abdomen: Soft, Nondistended. Incisions: Clean and dry. Urine: Yellow and draining well.  Lab Results: Recent Labs    04/04/20 1421  HGB 14.4  HCT 44.4    Assessment/Plan: POD#0   1) Continue to monitor, ambulate, IS   Pryor Curia. MD   LOS: 0 days   Wayne [name] 04/04/2020, 3:58 PM

## 2020-04-04 NOTE — Anesthesia Preprocedure Evaluation (Signed)
Anesthesia Evaluation  Patient identified by MRN, date of birth, ID band Patient awake    Reviewed: Allergy & Precautions, NPO status , Patient's Chart, lab work & pertinent test results  Airway Mallampati: II  TM Distance: >3 FB Neck ROM: Full    Dental no notable dental hx.    Pulmonary neg pulmonary ROS, Current Smoker and Patient abstained from smoking.,    Pulmonary exam normal breath sounds clear to auscultation       Cardiovascular hypertension, Pt. on medications negative cardio ROS Normal cardiovascular exam Rhythm:Regular Rate:Normal     Neuro/Psych negative neurological ROS  negative psych ROS   GI/Hepatic negative GI ROS, Neg liver ROS,   Endo/Other  negative endocrine ROS  Renal/GU negative Renal ROS  negative genitourinary   Musculoskeletal  (+) Arthritis , Osteoarthritis,    Abdominal (+) + obese,   Peds negative pediatric ROS (+)  Hematology negative hematology ROS (+)   Anesthesia Other Findings Prostate cancer  Reproductive/Obstetrics negative OB ROS                             Anesthesia Physical Anesthesia Plan  ASA: III  Anesthesia Plan: General   Post-op Pain Management:    Induction: Intravenous  PONV Risk Score and Plan: 1 and Ondansetron and Treatment may vary due to age or medical condition  Airway Management Planned: Oral ETT  Additional Equipment:   Intra-op Plan:   Post-operative Plan: Extubation in OR  Informed Consent: I have reviewed the patients History and Physical, chart, labs and discussed the procedure including the risks, benefits and alternatives for the proposed anesthesia with the patient or authorized representative who has indicated his/her understanding and acceptance.     Dental advisory given  Plan Discussed with: CRNA  Anesthesia Plan Comments:         Anesthesia Quick Evaluation

## 2020-04-05 ENCOUNTER — Encounter: Payer: Self-pay | Admitting: Medical Oncology

## 2020-04-05 ENCOUNTER — Encounter (HOSPITAL_COMMUNITY): Payer: Self-pay | Admitting: Urology

## 2020-04-05 DIAGNOSIS — C61 Malignant neoplasm of prostate: Secondary | ICD-10-CM | POA: Diagnosis not present

## 2020-04-05 LAB — HEMOGLOBIN AND HEMATOCRIT, BLOOD
HCT: 40.7 % (ref 39.0–52.0)
Hemoglobin: 13.1 g/dL (ref 13.0–17.0)

## 2020-04-05 MED ORDER — PROPOFOL 500 MG/50ML IV EMUL
INTRAVENOUS | Status: AC
  Filled 2020-04-05: qty 50

## 2020-04-05 MED ORDER — FENTANYL CITRATE (PF) 100 MCG/2ML IJ SOLN
INTRAMUSCULAR | Status: AC
  Filled 2020-04-05: qty 2

## 2020-04-05 MED ORDER — LIDOCAINE 2% (20 MG/ML) 5 ML SYRINGE
INTRAMUSCULAR | Status: AC
  Filled 2020-04-05: qty 5

## 2020-04-05 MED ORDER — ROCURONIUM BROMIDE 10 MG/ML (PF) SYRINGE
PREFILLED_SYRINGE | INTRAVENOUS | Status: AC
  Filled 2020-04-05: qty 10

## 2020-04-05 MED ORDER — EPHEDRINE 5 MG/ML INJ
INTRAVENOUS | Status: AC
  Filled 2020-04-05: qty 10

## 2020-04-05 MED ORDER — TRAMADOL HCL 50 MG PO TABS
50.0000 mg | ORAL_TABLET | Freq: Four times a day (QID) | ORAL | Status: DC | PRN
Start: 2020-04-05 — End: 2020-04-05

## 2020-04-05 MED ORDER — BISACODYL 10 MG RE SUPP
10.0000 mg | Freq: Once | RECTAL | Status: AC
  Administered 2020-04-05: 10 mg via RECTAL
  Filled 2020-04-05: qty 1

## 2020-04-05 MED ORDER — PHENYLEPHRINE 40 MCG/ML (10ML) SYRINGE FOR IV PUSH (FOR BLOOD PRESSURE SUPPORT)
PREFILLED_SYRINGE | INTRAVENOUS | Status: AC
  Filled 2020-04-05: qty 10

## 2020-04-05 NOTE — Discharge Summary (Signed)
  Date of admission: 04/04/2020  Date of discharge: 04/05/2020  Admission diagnosis: Prostate Cancer  Discharge diagnosis: Prostate Cancer  History and Physical: For full details, please see admission history and physical. Briefly, Wayne [name] is a 60 y.o. gentleman with localized prostate cancer.  After discussing management/treatment options, he elected to proceed with surgical treatment.  Hospital Course: Wayne [name] was taken to the operating room on 04/04/2020 and underwent a robotic assisted laparoscopic radical prostatectomy. He tolerated this procedure well and without complications. Postoperatively, he was able to be transferred to a regular hospital room following recovery from anesthesia.  He was able to begin ambulating the night of surgery. He remained hemodynamically stable overnight.  He had excellent urine output with appropriately minimal output from his pelvic drain and his pelvic drain was removed on POD #1.  He was transitioned to oral pain medication, tolerated a clear liquid diet, and had met all discharge criteria and was able to be discharged home later on POD#1.  Laboratory values: Recent Labs    04/04/20 1421 04/05/20 0451  HGB 14.4 13.1  HCT 44.4 40.7    Disposition: Home  Discharge instruction: He was instructed to be ambulatory but to refrain from heavy lifting, strenuous activity, or driving. He was instructed on urethral catheter care.  Discharge medications:   Allergies as of 04/05/2020      Reactions   Hydralazine Other (See Comments)   Headache      Medication List    STOP taking these medications   ibuprofen 800 MG tablet Commonly known as: ADVIL   tamsulosin 0.4 MG Caps capsule Commonly known as: FLOMAX     TAKE these medications   allopurinol 300 MG tablet Commonly known as: ZYLOPRIM Take 1 tablet (300 mg total) by mouth daily.   amLODipine 10 MG tablet Commonly known as: NORVASC Take 1 tablet (10 mg total) by mouth daily.    atorvastatin 40 MG tablet Commonly known as: LIPITOR Take 2 tablets (80 mg total) by mouth daily.   buPROPion 150 MG 12 hr tablet Commonly known as: WELLBUTRIN SR TAKE 1 TABLET(150 MG) BY MOUTH TWICE DAILY   hydrochlorothiazide 25 MG tablet Commonly known as: HYDRODIURIL Take 1 tablet (25 mg total) by mouth daily.   losartan 100 MG tablet Commonly known as: COZAAR Take 1 tablet (100 mg total) by mouth daily.   sulfamethoxazole-trimethoprim 800-160 MG tablet Commonly known as: BACTRIM DS Take 1 tablet by mouth 2 (two) times daily. Start the day prior to foley removal appointment   traMADol 50 MG tablet Commonly known as: Ultram Take 1-2 tablets (50-100 mg total) by mouth every 6 (six) hours as needed for moderate pain or severe pain.       Followup: He will followup in 1 week for catheter removal and to discuss his surgical pathology results.

## 2020-04-05 NOTE — Plan of Care (Signed)

## 2020-04-05 NOTE — Progress Notes (Signed)
Pt discharged home today per [name]. Pt's IV site D/C'd and WDL. Pt's VSS. Pt and wife provided with home medication list, discharge instructions and prescriptions. Verbalized understanding. Foley catheter teaching provided. Wife and patient return demonstrated understanding. Pt left floor via WC in stable condition accompanied by NT.

## 2020-04-05 NOTE — Discharge Instructions (Signed)

## 2020-04-05 NOTE — Progress Notes (Addendum)
1 Day Post-Op Subjective: The patient is doing well.  Tolerating CLD without nausea or vomiting. Pain is adequately controlled. Hasn't yet ambulated.   Objective: Vital signs in last 24 hours: Temp:  [98.1 F (36.7 C)-99.3 F (37.4 C)] 98.9 F (37.2 C) (03/15 0430) Pulse Rate:  [56-85] 71 (03/15 0430) Resp:  [16-20] 16 (03/15 0430) BP: (127-172)/(75-99) 131/83 (03/15 0430) SpO2:  [98 %-100 %] 99 % (03/15 0430) Weight:  [115.2 kg] 115.2 kg (03/14 0940)  Intake/Output from previous day: 03/14 0701 - 03/15 0700 In: 1404.2 [P.O.:240; I.V.:1014.2; IV Piggyback:150] Out: 1925 [POIPP:8984; Drains:175; Blood:100] Intake/Output this shift: No intake/output data recorded.  Physical Exam:  General: Alert and oriented CV: Regular rate Lungs: NWOB on RA GI: Soft, appropriately tender, nondistended. Incisions intact and covered with dermabond; no signs of infection. LLQ JP drain with minimal SS output  Urine: Foley in place draining yellow urine with sediment in tubing  Extremities: Warm and well-perfused   Lab Results: Recent Labs    04/04/20 1421 04/05/20 0451  HGB 14.4 13.1  HCT 44.4 40.7      Assessment/Plan: POD# 1 s/p robotic prostatectomy. Doing well   1) SL IVF 2) Ambulate, Incentive spirometry 3) Transition to oral pain medication 4) Dulcolax suppository 5) D/C pelvic drain 6) Plan for likely discharge later today    LOS: 0 days   Wayne [name] 04/05/2020, 7:23 AM

## 2020-04-06 ENCOUNTER — Telehealth: Payer: Self-pay

## 2020-04-06 NOTE — Telephone Encounter (Signed)
Transition Care Management Unsuccessful Follow-up Telephone Call  Date of discharge and from where:  04/05/2020, [facility]   Attempts:  1st Attempt  Reason for unsuccessful TCM follow-up call:  Left voice message on # [phone].  Call back requested to this CM.  Patient has appointment with Ms Oletta Lamas, NP on 05/18/2020.

## 2020-04-06 NOTE — Telephone Encounter (Signed)
Transition Care Management Follow-up Telephone Call  Date of discharge and from where: 04/05/2020, [facility]   How have you been since you were released from the hospital? He stated that he is feeling okay, no pain, has been walking around   Any questions or concerns? No  Items Reviewed:  Did the pt receive and understand the discharge instructions provided? Yes   Medications obtained and verified? Yes  - he said that he has all medications including the new ones and did not have any questions about the med regime  Other? No   Any new allergies since your discharge? No   Do you have support at home? Yes , his wife  [place] and Equipment/Supplies: Were home health services ordered? no If so, what is the name of the agency? n/a  Has the agency set up a time to come to the patient's home? not applicable Were any new equipment or medical supplies ordered?  No What is the name of the medical supply agency? n/a Were you able to get the supplies/equipment? not applicable Do you have any questions related to the use of the equipment or supplies? No   No new supplies ordered but he was discharged with a foley catheter in place. He said that is has been draining, pink tinged urine. Foley to be removed at urology appointment.   Functional Questionnaire: (I = Independent and D = Dependent) ADLs: I  He said he has a cane and walker to use if needed but he does not need them at this time.    Follow up appointments reviewed:   PCP Hospital f/u appt confirmed? Yes  Juluis Mire, NP 04/18/2020.  [facility] f/u appt confirmed? Yes  - urology - 04/13/2020.    Are transportation arrangements needed? No   If their condition worsens, is the pt aware to call PCP or go to the Emergency Dept.? Yes  Was the patient provided with contact information for the PCP's office or ED? Yes  Was to pt encouraged to call back with questions or concerns? Yes

## 2020-04-08 LAB — SURGICAL PATHOLOGY

## 2020-04-18 ENCOUNTER — Encounter (INDEPENDENT_AMBULATORY_CARE_PROVIDER_SITE_OTHER): Payer: Self-pay | Admitting: Primary Care

## 2020-04-18 ENCOUNTER — Other Ambulatory Visit: Payer: Self-pay

## 2020-04-18 ENCOUNTER — Ambulatory Visit (INDEPENDENT_AMBULATORY_CARE_PROVIDER_SITE_OTHER): Payer: Medicaid Other | Admitting: Primary Care

## 2020-04-18 VITALS — BP 112/74 | HR 105 | Temp 97.3°F | Ht 75.0 in | Wt 243.2 lb

## 2020-04-18 DIAGNOSIS — R1084 Generalized abdominal pain: Secondary | ICD-10-CM

## 2020-04-18 DIAGNOSIS — Z716 Tobacco abuse counseling: Secondary | ICD-10-CM | POA: Diagnosis not present

## 2020-04-18 DIAGNOSIS — Z09 Encounter for follow-up examination after completed treatment for conditions other than malignant neoplasm: Secondary | ICD-10-CM | POA: Diagnosis not present

## 2020-04-18 DIAGNOSIS — I10 Essential (primary) hypertension: Secondary | ICD-10-CM

## 2020-04-18 DIAGNOSIS — E782 Mixed hyperlipidemia: Secondary | ICD-10-CM | POA: Diagnosis not present

## 2020-04-18 DIAGNOSIS — Z760 Encounter for issue of repeat prescription: Secondary | ICD-10-CM | POA: Diagnosis not present

## 2020-04-18 MED ORDER — BUPROPION HCL ER (SR) 150 MG PO TB12: 150.0000 mg | ORAL_TABLET | Freq: Two times a day (BID) | ORAL | 1 refills | Status: DC

## 2020-04-18 MED ORDER — LOSARTAN POTASSIUM 100 MG PO TABS: 100.0000 mg | ORAL_TABLET | Freq: Every day | ORAL | 1 refills | Status: DC

## 2020-04-18 MED ORDER — HYDROCHLOROTHIAZIDE 25 MG PO TABS
25.0000 mg | ORAL_TABLET | Freq: Every day | ORAL | 3 refills | Status: DC
Start: 2020-04-18 — End: 2021-03-13

## 2020-04-18 MED ORDER — AMLODIPINE BESYLATE 10 MG PO TABS: 10.0000 mg | ORAL_TABLET | Freq: Every day | ORAL | 1 refills | Status: DC

## 2020-04-18 NOTE — Progress Notes (Signed)
CC: Hospital follow up     HPI [name] is a 60 y.o. obese male presents for follow up from the hospital. Admit date to the hospital was 04/04/20, patient was discharged from the hospital on 04/05/20,open surgical conversion for robotic/laparoscopic prostatectomy. Patient was admitted for:  elevated PSA of 113 in May 2021. He presented to Dr. Claudia [name] in November 2021 for evaluation and a repeat PSA was still quite elevated at 112. He underwent a TRUS biopsy of the prostate on 12/24/19 that confirmed  adenocarcinoma  biopsy cores positive for malignancy. Today he present with abdominal pain increase with trying to sit up and bend over 7/10 pain scare. Incisions burns - healing well no s/s of infection.  Past Medical History:  Diagnosis Date  . Arthritis    shoulders   . Gout   . High cholesterol   . Hypertension   . Prostate cancer ([place])      Allergies  Allergen Reactions  . Hydralazine Other (See Comments)    Headache      Current Outpatient Medications on File Prior to Visit  Medication Sig Dispense Refill  . allopurinol (ZYLOPRIM) 300 MG tablet Take 1 tablet (300 mg total) by mouth daily. 90 tablet 1  . amLODipine (NORVASC) 10 MG tablet Take 1 tablet (10 mg total) by mouth daily. 90 tablet 1  . atorvastatin (LIPITOR) 40 MG tablet Take 2 tablets (80 mg total) by mouth daily. 90 tablet 1  . buPROPion (WELLBUTRIN SR) 150 MG 12 hr tablet TAKE 1 TABLET(150 MG) BY MOUTH TWICE DAILY 180 tablet 0  . hydrochlorothiazide (HYDRODIURIL) 25 MG tablet Take 1 tablet (25 mg total) by mouth daily. 90 tablet 3  . losartan (COZAAR) 100 MG tablet Take 1 tablet (100 mg total) by mouth daily. 90 tablet 1  . traMADol (ULTRAM) 50 MG tablet Take 1-2 tablets (50-100 mg total) by mouth every 6 (six) hours as needed for moderate pain or severe pain. 20 tablet 0   No current facility-administered medications on file prior to visit.    ROS: all negative except above.   Physical Exam: Filed Weights    04/18/20 1034  Weight: 243 lb 3.2 oz (110.3 kg)   BP 112/74 (BP Location: Right Arm, Patient Position: Sitting, Cuff Size: Large)   Pulse (!) 105   Temp (!) 97.3 F (36.3 C) (Temporal)   Ht 6\' 3"  (1.905 m)   Wt 243 lb 3.2 oz (110.3 kg)   SpO2 98%   BMI 30.40 kg/m  General Appearance: Well nourished,obese male  in mild distress.  Eyes: PERRLA, EOMs, conjunctiva no swelling or erythema Sinuses: No Frontal/maxillary tenderness ENT/Mouth: Ext aud canals clear, TMs without erythema, bulging. Hearing normal.  Neck: Supple, thyroid normal.  Respiratory: Respiratory effort normal, BS equal bilaterally without rales, rhonchi, wheezing or stridor.  Cardio: RRR with no MRGs. Brisk peripheral pulses without edema.  Abdomen: Soft, + BS diminished . Tender to palpation especially on right side left side has no pain  Lymphatics: Non tender without lymphadenopathy.  Musculoskeletal: Full ROM, 5/5 strength, normal gait.  Skin: Warm, dry without rashes, lesions, ecchymosis. ( incision site with healing present) Neuro: Cranial nerves intact. Normal muscle tone, no cerebellar symptoms. Sensation intact.  Psych: Awake and oriented X 3, normal affect, Insight and Judgment appropriate.    [name] was seen today for hospitalization follow-up.  Diagnoses and all orders for this visit: [place] was seen today for hospitalization follow-up.  Diagnoses and all orders for this visit:  Hospital discharge follow-up S/p open surgical conversion for robotic/laparoscopic prostatectomy in today requesting pain medication referred to urology   Essential hypertension Blood pressure goal is less than 130/80,( well controlled) low-sodium, DASH diet, medication compliance,.( unable to exercise due to recent surgery ) Discussed medication compliance, adverse effects. -     hydrochlorothiazide (HYDRODIURIL) 25 MG tablet; Take 1 tablet (25 mg total) by mouth daily. -     losartan (COZAAR) 100 MG tablet; Take 1 tablet (100  mg total) by mouth daily.  amLODipine (NORVASC) 10 MG tablet; Take 1 tablet (10 mg total) by mouth daily.   Tobacco abuse counseling Discussed cessation agreed to medication Wellbutrin XL 150mg  daily. Increase risk factor HTN, respiratory complications and cancer   Mixed hyperlipidemia Continue to decrease your fatty foods, red meat, cheese, milk and increase fiber like whole grains and veggies.  Continue atorvastatin 80mg  until blood work is completed  -     Lipid panel; Future  Generalized abdominal pain Refer to Urology for pain medication s/p surgery. If any problems call the office.   Medication refill -     hydrochlorothiazide (HYDRODIURIL) 25 MG tablet; Take 1 tablet (25 mg total) by mouth daily. -     losartan (COZAAR) 100 MG tablet; Take 1 tablet (100 mg total) by mouth daily. -     amLODipine (NORVASC) 10 MG tablet; Take 1 tablet (10 mg total) by mouth daily.   [name], NP 10:45 AM

## 2020-04-20 ENCOUNTER — Telehealth: Payer: Self-pay | Admitting: Primary Care

## 2020-04-20 NOTE — Telephone Encounter (Signed)
I attempted to reach out to Mr.Dyk today to get him scheduled with the Managed Medicaid team. I left my contact info on his VM. I will reach out again in the 14 days.

## 2020-04-25 ENCOUNTER — Other Ambulatory Visit: Payer: Self-pay

## 2020-04-25 ENCOUNTER — Other Ambulatory Visit: Payer: Self-pay | Admitting: Obstetrics and Gynecology

## 2020-04-25 NOTE — Patient Instructions (Addendum)
Hi [name], thank you for speaking with me today.  [name] was given information about Medicaid Managed Care team care coordination services as a part of their [place] Medicaid benefit. Wayne [name] verbally consented to engagement with the [facility] Managed Care team.   For questions related to your [facility], please call: [phone] or visit the homepage here: [email or web address]  If you would like to schedule transportation through your [facility], please call the following number at least 2 days in advance of your appointment: [phone].   Call the [place] at [phone], at any time, 24 hours a day, 7 days a week. If you are in danger or need immediate medical attention call 911.  Wayne [name] - following are the goals we discussed in your visit today:  Goals Addressed            This Visit's Progress   . Disease Progression Prevented or Minimized       Evidence-based guidance:    Prepare patient for laboratory tests, such as lipid profile, microalbuminuria, estimated glomerular filtration rate and diagnostic exams based on [name] and presentation.   Assess signs/symptoms and [name] factors for hypertension, dyslipidemia, nephropathy, neuropathy and retinopathy.   Encourage lifestyle changes, such as increased intake of plant-based foods, stress reduction, regular physical activity and smoking cessation to prevent long-term complications and chronic diseases.   Discourage sedentary behavior or sitting longer than 30 minutes without interruption; individualize exercise or activity recommendations with potential limitations, such as neuropathy, retinopathy or the ability to prevent    hyperglycemia or hypoglycemia.   Prepare patient for use of pharmacologic therapy that may include antihypertensive, analgesic, statin (after  age 71 years); anticipate periodic adjustments based on chronic condition and laboratory results  Implement additional individualized goals and interventions based on identified [name] factors.   Prepare patient for consultation or referral for specialist care, such as ophthalmology, neurology, cardiology, podiatry, nephrology or perinatology.        Patient verbalizes understanding of instructions provided today.   The Managed Medicaid care management team will reach out to the patient again over the next 7 days.  The patient has been provided with contact information for the Managed Medicaid care management team and has been advised to call with any health related questions or concerns.   Wayne Raider RN, Wayne [name]  Wayne [name] [phone].  Following is a copy of your plan of care:  Patient Care Plan: General Plan of Care (Adult)    Problem Identified: Health Promotion or Disease Self-Management (General Plan of Care)   Priority: High  Onset Date: 04/25/2020    Long-Range Goal: Self-Management Plan Developed   Start Date: 04/25/2020  Expected End Date: 07/25/2020  This Visit's Progress: On track  Priority: High  Note:   Current Barriers:    Knowledge Deficits related to short term plan for care coordination needs and long term plans for chronic disease management needs.  Patient with recent diagnosis of prostate cancer s/p prostatectomy 04/04/20, some incisional soreness remains. Nurse Case Manager Clinical Goal(s):   patient will work with care management team to address care coordination and chronic disease management needs related to Disease Management  Educational Needs  Care Coordination  Medication Management and Education  Medication Reconciliation  Medication Assistance   Psychosocial Support   Interventions:   Evaluation of current treatment plan and patient's  adherence to plan as  established by provider.  Provided education to patient re: post surgical pain.  Reviewed medications with patient.  Discussed OTC pain medications.  Patient states he has stopped Wellbutrin, but is restarting.  Collaborated with Pharmacy  regarding medications.  Discussed plans with patient for ongoing care management follow up and provided patient with direct contact information for care management team  Reviewed scheduled/upcoming provider appointments.  Pharmacy referral for medication review Self Care Activities:  . Patient will self administer medications as prescribed . Patient will attend all scheduled provider appointments . Patient will call pharmacy for medication refills . Patient will call provider office for new concerns or questions Patient Goals: Over the next 30 days, patient will restart Wellbutrin. Over the next 30 days, patient will attend all provider appointments. Over the next 30 days, patient will speak with Pharmacist regarding medications. - Follow Up Plan: The care management team will reach out to the patient again over the next 7 days.    Evidence-based guidance:    Review biopsychosocial determinants of health screens.   Review need for preventive screening based on age, sex, family history and health history.   Determine level of modifiable health [name].   Discuss identified risks.   Identify areas where behavior change may lead to improved health.   Promote healthy lifestyle.   Evoke change talk using open-ended questions, pros and cons, as well as looking forward.   Identify and manage conditions or preconditions to reduce health [name].   Implement additional goals and interventions based on identified [name] factors.

## 2020-04-25 NOTE — Patient Outreach (Signed)
Medicaid Managed Care   Nurse Care Manager Note  04/25/2020 Name:  Wayne [name] MRN:  [id number] DOB:  02-26-1960  Wayne [name] is an 60 y.o. year old male who is a primary patient of [name], NP.  The [facility] Managed Care Coordination team was consulted for assistance with:    chronic healthcare management needs.  Wayne [name] was given information about Medicaid Managed Care Coordination team services today. Wayne [name] agreed to services and verbal consent obtained.  Engaged with patient by telephone for initial visit in response to provider referral for case management and/or care coordination services.   Assessments/Interventions:  Review of past medical history, allergies, medications, health status, including review of consultants reports, laboratory and other test data, was performed as part of comprehensive evaluation and provision of chronic care management services.  SDOH (Social Determinants of Health) assessments and interventions performed:   Care Plan  Allergies  Allergen Reactions  . Hydralazine Other (See Comments)    Headache    Medications Reviewed Today    Reviewed by Gayla Medicus, RN (Registered Nurse) on 04/25/20 at 1437  Med List Status: <None>  Medication Order Taking? Sig Documenting Provider Last Dose Status Informant  allopurinol (ZYLOPRIM) 300 MG tablet [id number] Yes Take 1 tablet (300 mg total) by mouth daily. [name], NP Taking Active Self  amLODipine (NORVASC) 10 MG tablet [id number] Yes Take 1 tablet (10 mg total) by mouth daily. [name], NP Taking Active   atorvastatin (LIPITOR) 40 MG tablet [id number] Yes Take 2 tablets (80 mg total) by mouth daily. [name], NP Taking Active Self           Med Note Wilmon Pali, MELISSA R   Wed Mar 23, 2020  1:42 PM) Pt has only been taking 1 tablet daily - just realized the script says 2 tabs daily   buPROPion (WELLBUTRIN SR) 150 MG 12 hr tablet [id number] No Take 1  tablet (150 mg total) by mouth 2 (two) times daily.  Patient not taking: Reported on 04/25/2020   [name], NP Not Taking Active   hydrochlorothiazide (HYDRODIURIL) 25 MG tablet [id number] Yes Take 1 tablet (25 mg total) by mouth daily. [name], NP Taking Active   losartan (COZAAR) 100 MG tablet [id number] Yes Take 1 tablet (100 mg total) by mouth daily. [name], NP Taking Active   traMADol (ULTRAM) 50 MG tablet [id number] No Take 1-2 tablets (50-100 mg total) by mouth every 6 (six) hours as needed for moderate pain or severe pain.  Patient not taking: Reported on 04/25/2020   Debbrah Alar, PA-C Not Taking Active   Med List Note [name], NP 06/16/19 1725): flosomax              Patient Active Problem List   Diagnosis Date Noted  . Prostate cancer ([place]) 04/04/2020  . Malignant neoplasm of prostate ([place]) 01/17/2020  . 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 Essential hypertension 08/12/2017  . Unilateral primary osteoarthritis, left knee 05/25/2016  . Unilateral primary osteoarthritis, right knee 05/25/2016    Conditions to be addressed/monitored per PCP order:  chronic healthcare management needs, obese, protate cancer s/p prostatectomy, HTN, tobacco use.  Care Plan : General Plan of Care (Adult)  Updates made by Gayla Medicus, RN since 04/25/2020 12:00 AM    Problem: Health Promotion or Disease Self-Management (General Plan of Care)   Priority: High  Onset Date:  04/25/2020    Long-Range Goal: Self-Management Plan Developed   Start Date: 04/25/2020  Expected End Date: 07/25/2020  This Visit's Progress: On track  Priority: High  Note:    Current Barriers:    Knowledge Deficits related to short term plan for care coordination needs and long term plans for chronic disease management needs.  Patient with recent diagnosis of prostate cancer s/p prostatectomy 04/04/20, some incisional soreness remains. Nurse Case  Manager Clinical Goal(s):   patient will work with care management team to address care coordination and chronic disease management needs related to Disease Management  Educational Needs  Care Coordination  Medication Management and Education  Medication Reconciliation  Medication Assistance   Psychosocial Support   Interventions:   Evaluation of current treatment plan and patient's adherence to plan as established by provider.  Provided education to patient re: post surgical pain.  Reviewed medications with patient.  Discussed OTC pain medications.  Patient states he has stopped Wellbutrin, but is restarting.  Collaborated with Pharmacy  regarding medications.  Discussed plans with patient for ongoing care management follow up and provided patient with direct contact information for care management team  Reviewed scheduled/upcoming provider appointments.  Pharmacy referral for medication review Self Care Activities:  . Patient will self administer medications as prescribed . Patient will attend all scheduled provider appointments . Patient will call pharmacy for medication refills . Patient will call provider office for new concerns or questions Patient Goals: Over the next 30 days, patient will restart Wellbutrin. Over the next 30 days, patient will attend all provider appointments. Over the next 30 days, patient will speak with Pharmacist regarding medications. - Follow Up Plan: The care management team will reach out to the patient again over the next 7 days.    Evidence-based guidance:    Review biopsychosocial determinants of health screens.   Review need for preventive screening based on age, sex, family history and health history.   Determine level of modifiable health risk.   Discuss identified risks.   Identify areas where behavior change may lead to improved health.   Promote healthy lifestyle.   Evoke change talk using open-ended questions, pros  and cons, as well as looking forward.   Identify and manage conditions or preconditions to reduce health risk.   Implement additional goals and interventions based on identified risk factors.            Follow Up:  Patient agrees to Care Plan and Follow-up.  Plan: The Managed Medicaid care management team will reach out to the patient again over the next 7 days. and The patient has been provided with contact information for the Managed Medicaid care management team and has been advised to call with any health related questions or concerns.  Date/time of next scheduled RN care management/care coordination outreach:  05/03/20 at 0900.

## 2020-04-27 ENCOUNTER — Encounter (INDEPENDENT_AMBULATORY_CARE_PROVIDER_SITE_OTHER): Payer: Self-pay | Admitting: Primary Care

## 2020-04-27 ENCOUNTER — Other Ambulatory Visit: Payer: Self-pay

## 2020-04-27 NOTE — Patient Outreach (Signed)
Medicaid Managed Care    Pharmacy Note  04/27/2020 Name: Wayne [name] MRN: [id number] DOB: December 19, 1960  Wayne [name] is a 60 y.o. year old male who is a primary care patient of [name], NP. The [facility] Managed Care Coordination team was consulted for assistance with disease management and care coordination needs.    Engaged with patient Engaged with patient by telephone for initial visit in response to referral for case management and/or care coordination services.  Wayne [name] was given information about Managed Medicaid Care Coordination team services today. Wayne [name] agreed to services and verbal consent obtained.   Objective:  Lab Results  Component Value Date   CREATININE 0.85 03/30/2020   CREATININE 1.13 06/10/2019   CREATININE 1.08 05/22/2019    No results found for: HGBA1C     Component Value Date/Time   CHOL 179 11/18/2019 0949   TRIG 239 (H) 11/18/2019 0949   HDL 37 (L) 11/18/2019 0949   CHOLHDL 4.8 11/18/2019 0949   LDLCALC 101 (H) 11/18/2019 0949    Other: (TSH, CBC, Vit D, etc.)  Clinical ASCVD: Yes  The 10-year ASCVD risk score Mikey Bussing DC Jr., et al., 2013) is: 17.8%   Values used to calculate the score:     Age: 68 years     Sex: Male     Is Non-Hispanic African American: Yes     Diabetic: No     Tobacco smoker: Yes     Systolic Blood Pressure: 284 mmHg     Is BP treated: Yes     HDL Cholesterol: 37 mg/dL     Total Cholesterol: 179 mg/dL    Other: (CHADS2VASc if Afib, PHQ9 if depression, MMRC or CAT for COPD, ACT, DEXA)  BP Readings from Last 3 Encounters:  04/18/20 112/74  04/05/20 131/83  03/30/20 (!) 169/93    Assessment/Interventions: Review of patient past medical history, allergies, medications, health status, including review of consultants reports, laboratory and other test data, was performed as part of comprehensive evaluation and provision of chronic care management services.   HTN Home readings:  112/74 Amlodipine HCTZ Losartan Plan: At goal,  patient stable/ symptoms controlled   Cholesterol Atorvastatin 40mg  2QD Plan: At goal,  patient stable/ symptoms controlled, will ask PCP to change to 80mg  QD  Gout Allopurinol Plan: At goal,  patient stable/ symptoms controlled   Smoking Bupropion Plan: At goal,  patient stable/ symptoms controlled   Medications: -Patient would like medications delivered and packaged. Would also like someone double checking who can optimize medications (Ex. Atorvastatin 40mg  2QD vs 80mg  QD) Plan: Verbal consent obtained for [facility] enhanced pharmacy services (medication synchronization, adherence packaging, delivery coordination). A medication sync plan was created to allow patient to get all medications delivered once every 30 to 90 days per patient preference. Patient understands they have freedom to choose pharmacy and clinical pharmacist will coordinate care between all prescribers and [facility].     SDOH (Social Determinants of Health) assessments and interventions performed:    Care Plan  Allergies  Allergen Reactions  . Hydralazine Other (See Comments)    Headache    Medications Reviewed Today    Reviewed by Gayla Medicus, RN (Registered Nurse) on 04/25/20 at 1437  Med List Status: <None>  Medication Order Taking? Sig Documenting Provider Last Dose Status Informant  allopurinol (ZYLOPRIM) 300 MG tablet [id number] Yes Take 1 tablet (300 mg total) by mouth daily. [name], NP Taking Active Self  amLODipine (NORVASC)  10 MG tablet [id number] Yes Take 1 tablet (10 mg total) by mouth daily. [name], NP Taking Active   atorvastatin (LIPITOR) 40 MG tablet [id number] Yes Take 2 tablets (80 mg total) by mouth daily. [name], NP Taking Active Self           Med Note Wilmon Pali, MELISSA R   Wed Mar 23, 2020  1:42 PM) Pt has only been taking 1 tablet daily - just realized the script says 2 tabs daily    buPROPion (WELLBUTRIN SR) 150 MG 12 hr tablet [id number] No Take 1 tablet (150 mg total) by mouth 2 (two) times daily.  Patient not taking: Reported on 04/25/2020   [name], NP Not Taking Active   hydrochlorothiazide (HYDRODIURIL) 25 MG tablet [id number] Yes Take 1 tablet (25 mg total) by mouth daily. [name], NP Taking Active   losartan (COZAAR) 100 MG tablet [id number] Yes Take 1 tablet (100 mg total) by mouth daily. [name], NP Taking Active   traMADol (ULTRAM) 50 MG tablet [id number] No Take 1-2 tablets (50-100 mg total) by mouth every 6 (six) hours as needed for moderate pain or severe pain.  Patient not taking: Reported on 04/25/2020   Debbrah Alar, PA-C Not Taking Active   Med List Note [name], NP 06/16/19 1725): flosomax              Patient Active Problem List   Diagnosis Date Noted  . Prostate cancer ([place]) 04/04/2020  . Malignant neoplasm of prostate ([place]) 01/17/2020  . 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 Essential hypertension 08/12/2017  . Unilateral primary osteoarthritis, left knee 05/25/2016  . Unilateral primary osteoarthritis, right knee 05/25/2016    Conditions to be addressed/monitored: HTN and Hypertriglyceridemia  Care Plan : Medication Management  Updates made by Lane Hacker, [place] since 04/27/2020 12:00 AM    Problem: Health Promotion or Disease Self-Management (General Plan of Care)     Goal: Medication Management   Note:   Current Barriers:  . Unable to self administer medications as prescribed . Does not adhere to prescribed medication regimen . Does not maintain contact with provider office . Does not contact provider office for questions/concerns .   Pharmacist Clinical Goal(s):  Marland Kitchen Over the next 90 days, patient will contact provider office for questions/concerns as evidenced notation of same in electronic health record through collaboration with PharmD and provider.   .   Interventions: . Inter-disciplinary care team collaboration (see longitudinal plan of care) . Comprehensive medication review performed; medication list updated in electronic medical record  @RXCPHYPERTENSION @ @RXCPHYPERLIPIDEMIA @  Patient Goals/Self-Care Activities . Over the next 90 days, patient will:  - take medications as prescribed collaborate with provider on medication access solutions  Follow Up Plan: The care management team will reach out to the patient again over the next 90 days.     Task: Mutually Develop and Royce Macadamia Achievement of Patient Goals   Note:   Care Management Activities:    - verbalization of feelings encouraged    Notes:      Medication Assistance: Utilizing Enhanced Pharmacy Services Delivery and Packaging  Follow up: Agree/   Plan: The care management team will reach out to the patient again over the next 90 days.   [place] Constable, Pharm.D., Managed Medicaid Pharmacist - [phone]

## 2020-04-27 NOTE — Patient Instructions (Signed)
Visit Information  Mr. Sackmann was given information about Medicaid Managed Care team care coordination services as a part of their [place] Medicaid benefit. [name] verbally consented to engagement with the [facility] Managed Care team.   For questions related to your [facility], please call: [phone] or visit the homepage here: [email or web address]  If you would like to schedule transportation through your [facility], please call the following number at least 2 days in advance of your appointment: [phone].   Call the [place] at [phone], at any time, 24 hours a day, 7 days a week. If you are in danger or need immediate medical attention call 911.  Mr. Rizzo - following are the goals we discussed in your visit today:  Goals Addressed   None     Please see education materials related to HTN provided as print materials.   Patient verbalizes understanding of instructions provided today.   The Managed Medicaid care management team will reach out to the patient again over the next 90 days.   [place] Constable, Pharm.D., Managed Medicaid Pharmacist [phone]   Following is a copy of your plan of care:  Patient Care Plan: General Plan of Care (Adult)    Problem Identified: Health Promotion or Disease Self-Management (General Plan of Care)   Priority: High  Onset Date: 04/25/2020    Long-Range Goal: Self-Management Plan Developed   Start Date: 04/25/2020  Expected End Date: 07/25/2020  This Visit's Progress: On track  Priority: High  Note:      Current Barriers:    Knowledge Deficits related to short term plan for care coordination needs and long term plans for chronic disease management needs.  Patient with recent diagnosis of prostate cancer s/p prostatectomy 04/04/20, some incisional soreness remains. Nurse Case  Manager Clinical Goal(s):   patient will work with care management team to address care coordination and chronic disease management needs related to Disease Management  Educational Needs  Care Coordination  Medication Management and Education  Medication Reconciliation  Medication Assistance   Psychosocial Support   Interventions:   Evaluation of current treatment plan and patient's adherence to plan as established by provider.  Provided education to patient re: post surgical pain.  Reviewed medications with patient.  Discussed OTC pain medications.  Patient states he has stopped Wellbutrin, but is restarting.  Collaborated with Pharmacy  regarding medications.  Discussed plans with patient for ongoing care management follow up and provided patient with direct contact information for care management team  Reviewed scheduled/upcoming provider appointments.  Pharmacy referral for medication review Self Care Activities:  . Patient will self administer medications as prescribed . Patient will attend all scheduled provider appointments . Patient will call pharmacy for medication refills . Patient will call provider office for new concerns or questions Patient Goals: Over the next 30 days, patient will restart Wellbutrin. Over the next 30 days, patient will attend all provider appointments. Over the next 30 days, patient will speak with Pharmacist regarding medications. - Follow Up Plan: The care management team will reach out to the patient again over the next 7 days.    Evidence-based guidance:    Review biopsychosocial determinants of health screens.   Review need for preventive screening based on age, sex, family history and health history.   Determine level of modifiable health risk.   Discuss identified risks.   Identify areas where behavior change may lead to improved health.  Promote healthy lifestyle.   Evoke change talk using open-ended questions, pros  and cons, as well as looking forward.   Identify and manage conditions or preconditions to reduce health risk.   Implement additional goals and interventions based on identified risk factors.     Task: Mutually Develop and Royce Macadamia Achievement of Patient Goals   Note:   Care Management Activities:    - verbalization of feelings encouraged    Notes:    Patient Care Plan: Medication Management    Problem Identified: Health Promotion or Disease Self-Management (General Plan of Care)     Goal: Medication Management   Note:   Current Barriers:  . Unable to self administer medications as prescribed . Does not adhere to prescribed medication regimen . Does not maintain contact with provider office . Does not contact provider office for questions/concerns .   Pharmacist Clinical Goal(s):  Marland Kitchen Over the next 90 days, patient will contact provider office for questions/concerns as evidenced notation of same in electronic health record through collaboration with PharmD and provider.  .   Interventions: . Inter-disciplinary care team collaboration (see longitudinal plan of care) . Comprehensive medication review performed; medication list updated in electronic medical record  @RXCPHYPERTENSION @ @RXCPHYPERLIPIDEMIA @  Patient Goals/Self-Care Activities . Over the next 90 days, patient will:  - take medications as prescribed collaborate with provider on medication access solutions  Follow Up Plan: The care management team will reach out to the patient again over the next 90 days.     Task: Mutually Develop and Royce Macadamia Achievement of Patient Goals   Note:   Care Management Activities:    - verbalization of feelings encouraged    Notes:

## 2020-05-03 ENCOUNTER — Other Ambulatory Visit: Payer: Self-pay

## 2020-05-03 ENCOUNTER — Other Ambulatory Visit: Payer: Self-pay | Admitting: Obstetrics and Gynecology

## 2020-05-03 NOTE — Patient Instructions (Signed)
Hi [name], thank you for speaking with me today-glad you are doing better.  [name] was given information about Medicaid Managed Care team care coordination services as a part of their [place] Medicaid benefit. Wayne [name] verbally consented to engagement with the [facility] Managed Care team.   For questions related to your [facility], please call: [phone] or visit the homepage here: [email or web address]  If you would like to schedule transportation through your [facility], please call the following number at least 2 days in advance of your appointment: [phone].   Call the [place] at [phone], at any time, 24 hours a day, 7 days a week. If you are in danger or need immediate medical attention call 911.  [name] - following are the goals we discussed in your visit today:  Goals Addressed            This Visit's Progress   . Disease Progression Prevented or Minimized       Update 05/03/20:  patient feeling well post surgery-no complaints.  Evidence-based guidance:    Prepare patient for laboratory tests, such as lipid profile, microalbuminuria, estimated glomerular filtration rate and diagnostic exams based on risk and presentation.   Assess signs/symptoms and risk factors for hypertension, dyslipidemia, nephropathy, neuropathy and retinopathy.   Encourage lifestyle changes, such as increased intake of plant-based foods, stress reduction, regular physical activity and smoking cessation to prevent long-term complications and chronic diseases.   Discourage sedentary behavior or sitting longer than 30 minutes without interruption; individualize exercise or activity recommendations with potential limitations, such as neuropathy, retinopathy or the ability to prevent    hyperglycemia or hypoglycemia.   Prepare  patient for use of pharmacologic therapy that may include antihypertensive, analgesic, statin (after age 47 years); anticipate periodic adjustments based on chronic condition and laboratory results  Implement additional individualized goals and interventions based on identified risk factors.   Prepare patient for consultation or referral for specialist care, such as ophthalmology, neurology, cardiology, podiatry, nephrology or perinatology.       Patient verbalizes understanding of instructions provided today.   The Managed Medicaid care management team will reach out to the patient again over the next 30 days.  The patient has been provided with contact information for the Managed Medicaid care management team and has been advised to call with any health related questions or concerns.   [name] RN, BSN [place]  Triad Curator - Managed Medicaid High Risk [phone].  Following is a copy of your plan of care:  Patient Care Plan: General Plan of Care (Adult)    Problem Identified: Health Promotion or Disease Self-Management (General Plan of Care)   Priority: High  Onset Date: 04/25/2020    Long-Range Goal: Self-Management Plan Developed   Start Date: 04/25/2020  Expected End Date: 07/25/2020  Recent Progress: On track  Priority: High  Note:    Curent Barriers:    Knowledge Deficits related to short term plan for care coordination needs and long term plans for chronic disease management needs.  Patient with recent diagnosis of prostate cancer s/p prostatectomy 04/04/20. Nurse Case Manager Clinical Goal(s):   patient will work with care management team to address care coordination and chronic disease management needs related to Disease Management  Educational Needs  Care Coordination  Medication Management and Education  Medication Reconciliation  Medication Assistance   Psychosocial Support   Interventions:  Evaluation of  current treatment plan and patient's adherence to plan as established by provider.  Provided education to patient re: post surgical pain.  Update 05/03/20:  Patient states he is no longer having any post surgical pain-feels fine.  Reviewed medications with patient.  Discussed OTC pain medications.  Patient states he has stopped Wellbutrin, but is restarting.  Update 05/03/20:  Patient states he has restarted Wellbutrin.-still smoking  Collaborated with Pharmacy  regarding medications.  Discussed plans with patient for ongoing care management follow up and provided patient with direct contact information for care management team  Reviewed scheduled/upcoming provider appointments.  Pharmacy referral for medication review Self Care Activities:  . Patient will self administer medications as prescribed . Patient will attend all scheduled provider appointments . Patient will call pharmacy for medication refills . Patient will call provider office for new concerns or questions Patient Goals: Over the next 30 days, patient will restart Wellbutrin Update 05/03/20:  Patient has restarted Wellbutrin. Over the next 30 days, patient will attend all provider appointments. Over the next 30 days, patient will speak with Pharmacist regarding medications. Update 05/03/20:  Patient met with Pharmacist 04/27/20 and continues to follow. - Follow Up Plan: The care management team will reach out to the patient again over the next 30 days.    Evidence-based guidance:    Review biopsychosocial determinants of health screens.   Review need for preventive screening based on age, sex, family history and health history.   Determine level of modifiable health risk.   Discuss identified risks.   Identify areas where behavior change may lead to improved health.   Promote healthy lifestyle.   Evoke change talk using open-ended questions, pros and cons, as well as looking forward.   Identify and manage  conditions or preconditions to reduce health risk.   Implement additional goals and interventions based on identified risk factors.

## 2020-05-03 NOTE — Patient Outreach (Signed)
Medicaid Managed Care   Nurse Care Manager Note  05/03/2020 Name:  Wayne [name] MRN:  [id number] DOB:  [birth year]  Wayne [name] is an 60 y.o. year old male who is a primary patient of [name], NP.  The [facility] Managed Care Coordination team was consulted for assistance with:    chronic healthcare management needs.  Mr. Coker was given information about Medicaid Managed Care Coordination team services today. Wayne [name] agreed to services and verbal consent obtained.  Engaged with patient by telephone for follow up visit in response to provider referral for case management and/or care coordination services.   Assessments/Interventions:  Review of past medical history, allergies, medications, health status, including review of consultants reports, laboratory and other test data, was performed as part of comprehensive evaluation and provision of chronic care management services.  SDOH (Social Determinants of Health) assessments and interventions performed:  Care Plan  Allergies  Allergen Reactions  . Hydralazine Other (See Comments)    Headache    Medications Reviewed Today    Reviewed by Gayla Medicus, RN (Registered Nurse) on 05/03/20 at [phone]  Med List Status: <None>  Medication Order Taking? Sig Documenting Provider Last Dose Status Informant  allopurinol (ZYLOPRIM) 300 MG tablet [id number] No Take 1 tablet (300 mg total) by mouth daily. [name], NP Taking Active Self  amLODipine (NORVASC) 10 MG tablet [id number] No Take 1 tablet (10 mg total) by mouth daily. [name], NP Taking Active   atorvastatin (LIPITOR) 40 MG tablet [id number] No Take 2 tablets (80 mg total) by mouth daily. [name], NP Taking Active Self           Med Note Wilmon Pali, MELISSA R   Wed Mar 23, 2020  1:42 PM) Pt has only been taking 1 tablet daily - just realized the script says 2 tabs daily   buPROPion (WELLBUTRIN SR) 150 MG 12 hr tablet [id number] No Take 1  tablet (150 mg total) by mouth 2 (two) times daily.  Patient not taking: Reported on 04/25/2020   [name], NP Not Taking Active   hydrochlorothiazide (HYDRODIURIL) 25 MG tablet [id number] No Take 1 tablet (25 mg total) by mouth daily. [name], NP Taking Active   losartan (COZAAR) 100 MG tablet [id number] No Take 1 tablet (100 mg total) by mouth daily. [name], NP Taking Active   traMADol (ULTRAM) 50 MG tablet [id number] No Take 1-2 tablets (50-100 mg total) by mouth every 6 (six) hours as needed for moderate pain or severe pain.  Patient not taking: No sig reported   Debbrah Alar, [place] Not Taking Active   Med List Note [name], NP 06/16/19 1725): flosomax           Patient Active Problem List   Diagnosis Date Noted  . Prostate cancer ([place]) 04/04/2020  . Malignant neoplasm of prostate ([place]) 01/17/2020  . 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 Essential hypertension 08/12/2017  . Unilateral primary osteoarthritis, left knee 05/25/2016  . Unilateral primary osteoarthritis, right knee 05/25/2016    Conditions to be addressed/monitored per PCP order:  chronic healthcare management needs, HTN, tobacco use, prostate Ca s/p removal 04/04/20.  Care Plan : General Plan of Care (Adult)  Updates made by Gayla Medicus, RN since 05/03/2020 12:00 AM    Problem: Health Promotion or Disease Self-Management (General Plan of Care)   Priority: High  Onset Date: 04/25/2020  Long-Range Goal: Self-Management Plan Developed   Start Date: 04/25/2020  Expected End Date: 07/25/2020  Recent Progress: On track  Priority: High  Note:    Current Barriers:    Knowledge Deficits related to short term plan for care coordination needs and long term plans for chronic disease management needs.  Patient with recent diagnosis of prostate cancer s/p prostatectomy 04/04/20. Nurse Case Manager Clinical Goal(s):   patient will work with care  management team to address care coordination and chronic disease management needs related to Disease Management  Educational Needs  Care Coordination  Medication Management and Education  Medication Reconciliation  Medication Assistance   Psychosocial Support   Interventions:   Evaluation of current treatment plan and patient's adherence to plan as established by provider.  Provided education to patient re: post surgical pain.  Update 05/03/20:  Patient states he is no longer having any post surgical pain-feels fine.  Reviewed medications with patient.  Discussed OTC pain medications.  Patient states he has stopped Wellbutrin, but is restarting.  Update 05/03/20:  Patient states he has restarted Wellbutrin.-still smoking  Collaborated with Pharmacy  regarding medications.  Discussed plans with patient for ongoing care management follow up and provided patient with direct contact information for care management team  Reviewed scheduled/upcoming provider appointments.  Pharmacy referral for medication review Self Care Activities:  . Patient will self administer medications as prescribed . Patient will attend all scheduled provider appointments . Patient will call pharmacy for medication refills . Patient will call provider office for new concerns or questions Patient Goals: Over the next 30 days, patient will restart Wellbutrin Update 05/03/20:  Patient has restarted Wellbutrin. Over the next 30 days, patient will attend all provider appointments. Over the next 30 days, patient will speak with Pharmacist regarding medications. Update 05/03/20:  Patient met with Pharmacist 04/27/20 and continues to follow. - Follow Up Plan: The care management team will reach out to the patient again over the next 30 days.    Evidence-based guidance:    Review biopsychosocial determinants of health screens.   Review need for preventive screening based on age, sex, family history and health  history.   Determine level of modifiable health risk.   Discuss identified risks.   Identify areas where behavior change may lead to improved health.   Promote healthy lifestyle.   Evoke change talk using open-ended questions, pros and cons, as well as looking forward.   Identify and manage conditions or preconditions to reduce health risk.   Implement additional goals and interventions based on identified risk factors.     Follow Up:  Patient agrees to Care Plan and Follow-up.  Plan: The Managed Medicaid care management team will reach out to the patient again over the next 30 days. and The patient has been provided with contact information for the Managed Medicaid care management team and has been advised to call with any health related questions or concerns.  Date/time of next scheduled RN care management/care coordination outreach:  06/02/20 at 0900.

## 2020-05-18 ENCOUNTER — Encounter (INDEPENDENT_AMBULATORY_CARE_PROVIDER_SITE_OTHER): Payer: Self-pay | Admitting: Primary Care

## 2020-05-18 ENCOUNTER — Other Ambulatory Visit: Payer: Self-pay

## 2020-05-18 ENCOUNTER — Ambulatory Visit (INDEPENDENT_AMBULATORY_CARE_PROVIDER_SITE_OTHER): Payer: Medicaid Other | Admitting: Primary Care

## 2020-05-18 DIAGNOSIS — E782 Mixed hyperlipidemia: Secondary | ICD-10-CM | POA: Diagnosis not present

## 2020-05-18 NOTE — Progress Notes (Signed)
[place]    [name]. [name] is a 60 year old male who presents for  hypertension evaluation, on previous continue medication amlodipine 10 mg daily, losartan 50 mg daily and HCTZ 25 mg daily.  Blood pressure today is unremarkable 119/80. Denies shortness of breath, headaches, chest pain or lower extremity edema  .Patient reports adherence with medications.  Current Medication List Current Outpatient Medications on File Prior to Visit  Medication Sig Dispense Refill  . allopurinol (ZYLOPRIM) 300 MG tablet Take 1 tablet (300 mg total) by mouth daily. 90 tablet 1  . amLODipine (NORVASC) 10 MG tablet Take 1 tablet (10 mg total) by mouth daily. 90 tablet 1  . atorvastatin (LIPITOR) 40 MG tablet Take 2 tablets (80 mg total) by mouth daily. 90 tablet 1  . buPROPion (WELLBUTRIN SR) 150 MG 12 hr tablet Take 1 tablet (150 mg total) by mouth 2 (two) times daily. 180 tablet 1  . hydrochlorothiazide (HYDRODIURIL) 25 MG tablet Take 1 tablet (25 mg total) by mouth daily. 90 tablet 3  . losartan (COZAAR) 100 MG tablet Take 1 tablet (100 mg total) by mouth daily. 90 tablet 1   No current facility-administered medications on file prior to visit.    Past Medical History  Past Medical History:  Diagnosis Date  . Arthritis    shoulders   . Gout   . High cholesterol   . Hypertension   . Prostate cancer ([place])    Dietary habits include: monitor sodium intake  Exercise habits include:walking  Family / Social history: None ASCVD risk factors include- [name]  O:  Physical Exam Vitals reviewed.  Constitutional:      Appearance: He is obese.  HENT:     Right Ear: Tympanic membrane and external ear normal.     Left Ear: Tympanic membrane and external ear normal.     Nose: Nose normal.  Eyes:     Extraocular Movements: Extraocular movements intact.     Pupils: Pupils are equal, round, and reactive to light.  Cardiovascular:     Rate and Rhythm: Normal rate and regular rhythm.   Pulmonary:     Effort: Pulmonary effort is normal.     Breath sounds: Normal breath sounds.  Abdominal:     General: Bowel sounds are normal. There is distension.     Palpations: Abdomen is soft.     Comments: Surgical scar (s/p prostate surgery) tender midline no s/s of infection   Musculoskeletal:        General: Normal range of motion.     Cervical back: Normal range of motion and neck supple.  Skin:    General: Skin is warm and dry.  Neurological:     General: No focal deficit present.     Mental Status: He is alert and oriented to person, place, and time.  Psychiatric:        Mood and Affect: Mood normal.        Behavior: Behavior normal.        Thought Content: Thought content normal.        Judgment: Judgment normal.      ROS  Last 3 Office BP readings: BP Readings from Last 3 Encounters:  05/18/20 119/80  04/18/20 112/74  04/05/20 131/83    BMET    Component Value Date/Time   NA 141 03/30/2020 1041   NA 141 06/10/2019 1045   K 3.6 03/30/2020 1041   CL 106 03/30/2020 1041   CO2 21 (L) 03/30/2020 1041  GLUCOSE 105 (H) 03/30/2020 1041   BUN 12 03/30/2020 1041   BUN 14 06/10/2019 1045   CREATININE 0.85 03/30/2020 1041   CALCIUM 9.4 03/30/2020 1041   GFRNONAA >60 03/30/2020 1041   GFRAA 82 06/10/2019 1045    Renal function: CrCl cannot be calculated (Patient's most recent lab result is older than the maximum 21 days allowed.).  Clinical ASCVD: Yes  The 10-year ASCVD risk score Mikey Bussing DC Jr., et al., 2013) is: 19.8%   Values used to calculate the score:     Age: 67 years     Sex: Male     Is Non-Hispanic African American: Yes     Diabetic: No     Tobacco smoker: Yes     Systolic Blood Pressure: 546 mmHg     Is BP treated: Yes     HDL Cholesterol: 37 mg/dL     Total Cholesterol: 179 mg/dL   A/P: Hypertension longstanding currently amlodipine 10 mg daily, losartan 50 mg daily and HCTZ 25 mg daily on current medications. BP Goal = 130/80 mmHg.  Patient is adherent with current medications.  -Continued  -F/u labs ordered - lipid -Counseled on lifestyle modifications for blood pressure control including reduced dietary sodium, increased exercise, adequate sleep  [name]

## 2020-05-19 ENCOUNTER — Other Ambulatory Visit (INDEPENDENT_AMBULATORY_CARE_PROVIDER_SITE_OTHER): Payer: Self-pay | Admitting: Primary Care

## 2020-05-19 LAB — LIPID PANEL
Chol/HDL Ratio: 3.9 ratio (ref 0.0–5.0)
Cholesterol, Total: 146 mg/dL (ref 100–199)
HDL: 37 mg/dL — ABNORMAL LOW (ref >39)
LDL Chol Calc (NIH): 82 mg/dL (ref 0–99)
Triglycerides: 154 mg/dL — ABNORMAL HIGH (ref 0–149)
VLDL Cholesterol Cal: 27 mg/dL (ref 5–40)

## 2020-05-19 MED ORDER — ATORVASTATIN CALCIUM 40 MG PO TABS: 40.0000 mg | ORAL_TABLET | Freq: Every day | ORAL | 3 refills | Status: DC

## 2020-05-26 ENCOUNTER — Other Ambulatory Visit (INDEPENDENT_AMBULATORY_CARE_PROVIDER_SITE_OTHER): Payer: Self-pay | Admitting: Primary Care

## 2020-05-26 NOTE — Telephone Encounter (Signed)
   Notes to clinic: this medication was stop at discharge  Review for continued use and refill    Requested Prescriptions  Pending Prescriptions Disp Refills   ibuprofen (ADVIL) 800 MG tablet [Pharmacy Med Name: IBUPROFEN 800MG  TABLETS] 30 tablet 1    Sig: TAKE 1 TABLET(800 MG) BY MOUTH EVERY 8 HOURS AS NEEDED FOR MODERATE PAIN      Analgesics:  NSAIDS Passed - 05/26/2020  9:30 AM      Passed - Cr in normal range and within 360 days    Creatinine, Ser  Date Value Ref Range Status  03/30/2020 0.85 0.61 - 1.24 mg/dL Final          Passed - HGB in normal range and within 360 days    Hemoglobin  Date Value Ref Range Status  04/05/2020 13.1 13.0 - 17.0 g/dL Final  06/10/2019 15.7 13.0 - 17.7 g/dL Final          Passed - Patient is not pregnant      Passed - Valid encounter within last 12 months    Recent Outpatient Visits           1 week ago Mixed hyperlipidemia   [place], [name] P, NP   1 month ago Hospital discharge follow-up   [place], [place], NP   6 months ago Tobacco use disorder   [place], [name] P, NP   7 months ago Elevated PSA   [place], [place], NP   9 months ago Tobacco use disorder   [place] [name], NP

## 2020-05-28 ENCOUNTER — Encounter (INDEPENDENT_AMBULATORY_CARE_PROVIDER_SITE_OTHER): Payer: Self-pay | Admitting: Primary Care

## 2020-05-28 DIAGNOSIS — Z8739 Personal history of other diseases of the musculoskeletal system and connective tissue: Secondary | ICD-10-CM

## 2020-06-02 ENCOUNTER — Other Ambulatory Visit: Payer: Self-pay | Admitting: Obstetrics and Gynecology

## 2020-06-02 ENCOUNTER — Other Ambulatory Visit: Payer: Self-pay

## 2020-06-02 NOTE — Patient Instructions (Signed)
Hi [name], thank you for speaking with me today.  [name] was given information about Medicaid Managed Care team care coordination services as a part of their [place] Medicaid benefit. Wayne [name] verbally consented to engagement with the [facility] Managed Care team.   For questions related to your [facility], please call: [phone] or visit the homepage here: [email or web address]  If you would like to schedule transportation through your [facility], please call the following number at least 2 days in advance of your appointment: [phone].   Call the [place] at [phone], at any time, 24 hours a day, 7 days a week. If you are in danger or need immediate medical attention call 911.  [name] - following are the goals we discussed in your visit today:  Goals Addressed            This Visit's Progress   . Disease Progression Prevented or Minimized       Update 05/03/20:  patient feeling well post surgery-no complaints. Update 06/02/20:  patient continues to do well since surgery-no complaints except for urine leakage-will follow up with Dr. Gwen [name]  Evidence-based guidance:    Prepare patient for laboratory tests, such as lipid profile, microalbuminuria, estimated glomerular filtration rate and diagnostic exams based on risk and presentation.   Assess signs/symptoms and risk factors for hypertension, dyslipidemia, nephropathy, neuropathy and retinopathy.   Encourage lifestyle changes, such as increased intake of plant-based foods, stress reduction, regular physical activity and smoking cessation to prevent long-term complications and chronic diseases.   Discourage sedentary behavior or sitting longer than 30 minutes without interruption; individualize exercise or activity recommendations with potential  limitations, such as neuropathy, retinopathy or the ability to prevent    hyperglycemia or hypoglycemia.   Prepare patient for use of pharmacologic therapy that may include antihypertensive, analgesic, statin (after age 54 years); anticipate periodic adjustments based on chronic condition and laboratory results  Implement additional individualized goals and interventions based on identified risk factors.   Prepare patient for consultation or referral for specialist care, such as ophthalmology, neurology, cardiology, podiatry, nephrology or perinatology.      Patient verbalizes understanding of instructions provided today.   The Managed Medicaid care management team will reach out to the patient again over the next 30 days.  The patient has been provided with contact information for the Managed Medicaid care management team and has been advised to call with any health related questions or concerns.  Wayne Raider RN, BSN [place]  Triad Curator - Managed Medicaid High Risk [phone].  Following is a copy of your plan of care:  Patient Care Plan: General Plan of Care (Adult)    Problem Identified: Health Promotion or Disease Self-Management (General Plan of Care)   Priority: High  Onset Date: 04/25/2020    Long-Range Goal: Self-Management Plan Developed   Start Date: 04/25/2020  Expected End Date: 07/25/2020  Recent Progress: On track  Priority: High  Note:    Current Barriers:    Knowledge Deficits related to short term plan for care coordination needs and long term plans for chronic disease management needs.  Patient with recent diagnosis of prostate cancer s/p prostatectomy 04/04/20.  Patient complains of urinary leakage-will call urologist, Dr. Alinda [name]. Nurse Case Manager Clinical Goal(s):   patient will work with care management team to address care coordination and chronic disease management needs related to Disease  Management  Educational Needs  Care Coordination  Medication Management and Education  Medication Reconciliation  Medication Assistance   Psychosocial Support   Interventions:   Evaluation of current treatment plan and patient's adherence to plan as established by provider.  Provided education to patient re: post surgical pain.  Update 05/03/20:  Patient states he is no longer having any post surgical pain-feels fine.  Reviewed medications with patient.  Discussed OTC pain medications.  Patient states he has stopped Wellbutrin, but is restarting.  Update 05/03/20:  Patient states he has restarted Wellbutrin.-still smoking  Update 06/02/20:  Patient continues to smoke 1/2 ppd and is taking Wellbutrin.  Collaborated with Pharmacy  regarding medications.  Discussed plans with patient for ongoing care management follow up and provided patient with direct contact information for care management team  Reviewed scheduled/upcoming provider appointments.  Encouraged patient to follow up with Dr. Lynne [name] office for com[plaints of urinary leakage and to find out when next appointment is.  Pharmacy referral for medication review Self Care Activities:  . Patient will self administer medications as prescribed . Patient will attend all scheduled provider appointments . Patient will call pharmacy for medication refills . Patient will call provider office for new concerns or questions Patient Goals: Over the next 30 days, patient will restart Wellbutrin Update 05/03/20:  Patient has restarted Wellbutrin. Over the next 30 days, patient will attend all provider appointments. Over the next 30 days, patient will speak with Pharmacist regarding medications. Update 05/03/20:  Patient met with Pharmacist 04/27/20 and continues to follow. - Follow Up Plan: The care management team will reach out to the patient again over the next 30 days.    Evidence-based guidance:    Review biopsychosocial  determinants of health screens.   Review need for preventive screening based on age, sex, family history and health history.   Determine level of modifiable health risk.   Discuss identified risks.   Identify areas where behavior change may lead to improved health.   Promote healthy lifestyle.   Evoke change talk using open-ended questions, pros and cons, as well as looking forward.   Identify and manage conditions or preconditions to reduce health risk.   Implement additional goals and interventions based on identified risk factors.

## 2020-06-02 NOTE — Patient Outreach (Signed)
Medicaid Managed Care   Nurse Care Manager Note  06/02/2020 Name:  WALLACE GAPPA MRN:  [id number] DOB:  Oct 15, 1960  Wayne [name] is an 60 y.o. year old male who is a primary patient of [name], NP.  The [facility] Managed Care Coordination team was consulted for assistance with:    chronic healthcare management needs.  Mr. Gerst was given information about Medicaid Managed Care Coordination team services today. Wayne [name] agreed to services and verbal consent obtained.  Engaged with patient by telephone for follow up visit in response to provider referral for case management and/or care coordination services.   Assessments/Interventions:  Review of past medical history, allergies, medications, health status, including review of consultants reports, laboratory and other test data, was performed as part of comprehensive evaluation and provision of chronic care management services.  SDOH (Social Determinants of Health) assessments and interventions performed:   Care Plan  Allergies  Allergen Reactions  . Hydralazine Other (See Comments)    Headache    Medications Reviewed Today    Reviewed by Gayla Medicus, RN (Registered Nurse) on 06/02/20 at [phone]  Med List Status: <None>  Medication Order Taking? Sig Documenting Provider Last Dose Status Informant  allopurinol (ZYLOPRIM) 300 MG tablet [id number] Yes Take 1 tablet (300 mg total) by mouth daily. [name], NP Taking Active Self  amLODipine (NORVASC) 10 MG tablet [id number] Yes Take 1 tablet (10 mg total) by mouth daily. [name], NP Taking Active   atorvastatin (LIPITOR) 40 MG tablet [id number] Yes Take 1 tablet (40 mg total) by mouth daily. [name], NP Taking Active   buPROPion [facility] SR) 150 MG 12 hr tablet [id number] Yes Take 1 tablet (150 mg total) by mouth 2 (two) times daily. [name], NP Taking Active   hydrochlorothiazide (HYDRODIURIL) 25 MG tablet [id number] Yes  Take 1 tablet (25 mg total) by mouth daily. [name], NP Taking Active   losartan (COZAAR) 100 MG tablet [id number] Yes Take 1 tablet (100 mg total) by mouth daily. [name], NP Taking Active   Med List Note [name], NP 06/16/19 1725): flosomax              Patient Active Problem List   Diagnosis Date Noted  . Prostate cancer ([place]) 04/04/2020  . Malignant neoplasm of prostate ([place]) 01/17/2020  . 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 Essential hypertension 08/12/2017  . Unilateral primary osteoarthritis, left knee 05/25/2016  . Unilateral primary osteoarthritis, right knee 05/25/2016    Conditions to be addressed/monitored per PCP order:  chronic healthcare management needs, HTN, prostate cancer s/p prostatectomy, osteoarthritis, gout.  Care Plan : General Plan of Care (Adult)  Updates made by Gayla Medicus, RN since 06/02/2020 12:00 AM    Problem: Health Promotion or Disease Self-Management (General Plan of Care)   Priority: High  Onset Date: 04/25/2020    Long-Range Goal: Self-Management Plan Developed   Start Date: 04/25/2020  Expected End Date: 07/25/2020  Recent Progress: On track  Priority: High  Note:    Current Barriers:    Knowledge Deficits related to short term plan for care coordination needs and long term plans for chronic disease management needs.  Patient with recent diagnosis of prostate cancer s/p prostatectomy 04/04/20.  Patient complains of urinary leakage-will call urologist, [name]. Nurse Case Manager Clinical Goal(s):   patient will work with care management team to address care coordination  and chronic disease management needs related to Disease Management  Educational Needs  Care Coordination  Medication Management and Education  Medication Reconciliation  Medication Assistance   Psychosocial Support   Interventions:   Evaluation of current treatment plan and patient's adherence  to plan as established by provider.  Provided education to patient re: post surgical pain.  Update 05/03/20:  Patient states he is no longer having any post surgical pain-feels fine.  Reviewed medications with patient.  Discussed OTC pain medications.  Patient states he has stopped Wellbutrin, but is restarting.  Update 05/03/20:  Patient states he has restarted Wellbutrin.-still smoking  Update 06/02/20:  Patient continues to smoke 1/2 ppd and is taking Wellbutrin.  Collaborated with Pharmacy  regarding medications.  Discussed plans with patient for ongoing care management follow up and provided patient with direct contact information for care management team  Reviewed scheduled/upcoming provider appointments.  Encouraged patient to follow up with Dr. Lynne Logan office for com[plaints of urinary leakage and to find out when next appointment is.  Pharmacy referral for medication review Self Care Activities:  . Patient will self administer medications as prescribed . Patient will attend all scheduled provider appointments . Patient will call pharmacy for medication refills . Patient will call provider office for new concerns or questions Patient Goals: Over the next 30 days, patient will restart Wellbutrin Update 05/03/20:  Patient has restarted Wellbutrin. Over the next 30 days, patient will attend all provider appointments. Over the next 30 days, patient will speak with Pharmacist regarding medications. Update 05/03/20:  Patient met with Pharmacist 04/27/20 and continues to follow. - Follow Up Plan: The care management team will reach out to the patient again over the next 30 days.    Evidence-based guidance:    Review biopsychosocial determinants of health screens.   Review need for preventive screening based on age, sex, family history and health history.   Determine level of modifiable health risk.   Discuss identified risks.   Identify areas where behavior change may lead to  improved health.   Promote healthy lifestyle.   Evoke change talk using open-ended questions, pros and cons, as well as looking forward.   Identify and manage conditions or preconditions to reduce health risk.   Implement additional goals and interventions based on identified risk factors.     Follow Up:  Patient agrees to Care Plan and Follow-up.  Plan: The Managed Medicaid care management team will reach out to the patient again over the next 30 days. and The patient has been provided with contact information for the Managed Medicaid care management team and has been advised to call with any health related questions or concerns.  Date/time of next scheduled RN care management/care coordination outreach:  06/28/20 at 1030.

## 2020-06-07 ENCOUNTER — Other Ambulatory Visit: Payer: Self-pay

## 2020-06-07 ENCOUNTER — Other Ambulatory Visit (INDEPENDENT_AMBULATORY_CARE_PROVIDER_SITE_OTHER): Payer: Self-pay | Admitting: Primary Care

## 2020-06-07 ENCOUNTER — Telehealth (INDEPENDENT_AMBULATORY_CARE_PROVIDER_SITE_OTHER): Payer: Self-pay

## 2020-06-07 DIAGNOSIS — I10 Essential (primary) hypertension: Secondary | ICD-10-CM

## 2020-06-07 DIAGNOSIS — Z760 Encounter for issue of repeat prescription: Secondary | ICD-10-CM

## 2020-06-07 NOTE — Telephone Encounter (Signed)
[facility] to [facility]

## 2020-06-07 NOTE — Patient Outreach (Signed)
Patient needs refill of Atorvastatin, called PCP office on 06/06/20 and spoke with [facility], she assured meds would be sent in. [facility] reached out today, 06/07/20, and no script. Spoke with Santiago Glad who will try to get script sent in ASAP

## 2020-06-07 NOTE — Telephone Encounter (Signed)
Ovid Curd with [facility] called in asking Juluis Mire for patient Rx to be sent in today please. Say that they need to go out to patient tomorrow, please advise

## 2020-06-07 NOTE — Telephone Encounter (Signed)
Copied from [place] [id number]. Topic: Quick Communication - Rx Refill/Question >> Jun 06, 2020  4:29 PM Loma Boston wrote: Medication: atorvastatin (LIPITOR) 40 MG tablet 90 tablet 3 05/19/2020   Sig - Route: Take 1 tablet (40 mg total) by mouth daily. - Oral  Sent to pharmacy as: atorvastatin (LIPITOR) 40 MG tablet Regarding above med requested change: Ovid Curd from [facility] is stating that this pt is having a lot of trouble with his meds, affordability, packaging, etc. He has found a pharmacy can get cheaper and they will package for him. I have added the pharmacy as he says he has moved everything over but states he needs a script written for the above atorvastatin at 80 mg daily vs 40 mg. [facility]  # [phone]   Preferred Pharmacy (with phone number or street name): [facility] - [place], [place] - [address]  Phone:  [phone] Fax:  [phone]     Agent: Please be advised that RX refills may take up to 3 business days. We ask that you follow-up with your pharmacy.

## 2020-06-08 ENCOUNTER — Encounter (INDEPENDENT_AMBULATORY_CARE_PROVIDER_SITE_OTHER): Payer: Self-pay | Admitting: Primary Care

## 2020-06-08 ENCOUNTER — Other Ambulatory Visit: Payer: Self-pay

## 2020-06-08 ENCOUNTER — Ambulatory Visit (INDEPENDENT_AMBULATORY_CARE_PROVIDER_SITE_OTHER): Payer: Medicaid Other | Admitting: Primary Care

## 2020-06-08 VITALS — BP 133/81 | HR 73 | Temp 97.8°F | Wt 251.2 lb

## 2020-06-08 DIAGNOSIS — I10 Essential (primary) hypertension: Secondary | ICD-10-CM | POA: Diagnosis not present

## 2020-06-08 DIAGNOSIS — N521 Erectile dysfunction due to diseases classified elsewhere: Secondary | ICD-10-CM

## 2020-06-08 MED ORDER — TADALAFIL 10 MG PO TABS: 10.0000 mg | ORAL_TABLET | ORAL | 1 refills | Status: DC | PRN

## 2020-06-08 NOTE — Progress Notes (Signed)
Established Patient Office Vis Subjective:  Patient ID: Wayne [name], male    DOB: 04-04-1960  Age: 60 y.o. MRN: [id number]  CC: No chief complaint on file.   HPI [name] is a 60 year old male who presents for problems with erectile dysfunction.  Initially sent a request for medication for this problem.  Advised him to schedule an appointment this has never been worked up or discussed therefore unable to refill his request for Viagra.  Patient feels like this problem has been called due to surgical intervention history of prostate cancer.  Blood pressure is stable he voices no shortness of breath, headaches, chest pain or lower extremity edema, sudden onset, vision changes, unilateral weakness, dizziness, paresthesias  Past Medical History:  Diagnosis Date  . Arthritis    shoulders   . Gout   . High cholesterol   . Hypertension   . Prostate cancer [facility])     Past Surgical History:  Procedure Laterality Date  . KNEE ARTHROSCOPY    . LUMBAR DISC SURGERY    . LYMPHADENECTOMY Bilateral 04/04/2020   Procedure: LYMPHADENECTOMY, PELVIC;  Surgeon: Raynelle Bring, MD;  Location: [facility];  Service: Urology;  Laterality: Bilateral;  . ROBOT ASSISTED LAPAROSCOPIC RADICAL PROSTATECTOMY N/A 04/04/2020   Procedure: XI ROBOTIC ASSISTED LAPAROSCOPIC RADICAL PROSTATECTOMY LEVEL 2;  Surgeon: Raynelle Bring, MD;  Location: [facility];  Service: Urology;  Laterality: N/A;    Family History  Problem Relation Age of Onset  . Colon cancer Mother   . Pancreatic cancer Maternal Uncle   . Colon polyps Neg Hx   . Esophageal cancer Neg Hx   . Rectal cancer Neg Hx     Social History   Socioeconomic History  . Marital status: Married    Spouse name: Mazon  . Number of children: 4  . Years of education: Not on file  . Highest education level: Not on file  Occupational History    Comment: patient does not work full time  Tobacco Use  . Smoking status: Current Every Day Smoker     Packs/day: 0.50    Types: Cigarettes  . Smokeless tobacco: Never Used  . Tobacco comment: Started smoking at the age of 67  Vaping Use  . Vaping Use: Never used  Substance and Sexual Activity  . Alcohol use: Yes    Alcohol/week: 14.0 standard drinks    Types: 14 Cans of beer per week  . Drug use: No  . Sexual activity: Yes  Other Topics Concern  . Not on file  Social History Narrative  . Not on file   Social Determinants of Health   Financial Resource Strain: Not on file  Food Insecurity: Not on file  Transportation Needs: Not on file  Physical Activity: Not on file  Stress: Not on file  Social Connections: Not on file  Intimate Partner Violence: Not on file    Outpatient Medications Prior to Visit  Medication Sig Dispense Refill  . allopurinol (ZYLOPRIM) 300 MG tablet Take 1 tablet (300 mg total) by mouth daily. 90 tablet 1  . amLODipine (NORVASC) 10 MG tablet Take 1 tablet (10 mg total) by mouth daily. 90 tablet 1  . atorvastatin (LIPITOR) 40 MG tablet Take 1 tablet (40 mg total) by mouth daily. 90 tablet 3  . buPROPion (WELLBUTRIN SR) 150 MG 12 hr tablet Take 1 tablet (150 mg total) by mouth 2 (two) times daily. 180 tablet 1  . hydrochlorothiazide (HYDRODIURIL) 25 MG tablet Take 1  tablet (25 mg total) by mouth daily. 90 tablet 3  . losartan (COZAAR) 100 MG tablet TAKE 1 TABLET(100 MG) BY MOUTH DAILY 90 tablet 1   No facility-administered medications prior to visit.    Allergies  Allergen Reactions  . Hydralazine Other (See Comments)    Headache    ROS Review of Systems  Genitourinary:       ED  All other systems reviewed and are negative.     Objective:     Wt Readings from Last 3 Encounters:  05/18/20 245 lb 12.8 oz (111.5 kg)  04/18/20 243 lb 3.2 oz (110.3 kg)  04/04/20 254 lb (115.2 kg)   Physical Exam General: No apparent distress.  obese male Eyes: Extraocular eye movements intact, pupils equal and round. Neck: Supple, trachea midline. Thyroid:  No enlargement, mobile without fixation, no tenderness. Cardiovascular: Regular rhythm and rate, no murmur, normal radial pulses. Respiratory: Normal respiratory effort, clear to auscultation. Gastrointestinal: Normal pitch active bowel sounds, nontender abdomen without distention or appreciable hepatomegaly. Musculoskeletal: Normal muscle tone, no tenderness on palpation of tibia, no excessive thoracic kyphosis. Skin: Appropriate warmth, no visible rash. Mental status: Alert, conversant, speech clear, thought logical, appropriate mood and affect, no hallucinations or delusions evident. Hematologic/lymphatic: No cervical adenopathy, no visible ecchymoses   Health Maintenance Due  Topic Date Due  . COVID-19 Vaccine (3 - Pfizer risk 4-dose series) 10/17/2019    There are no preventive care reminders to display for this patient.  No results found for: TSH Lab Results  Component Value Date   WBC 6.2 03/30/2020   HGB 13.1 04/05/2020   HCT 40.7 04/05/2020   MCV 90.3 03/30/2020   PLT 277 03/30/2020   Lab Results  Component Value Date   NA 141 03/30/2020   K 3.6 03/30/2020   CO2 21 (L) 03/30/2020   GLUCOSE 105 (H) 03/30/2020   BUN 12 03/30/2020   CREATININE 0.85 03/30/2020   BILITOT 0.3 06/10/2019   ALKPHOS 114 06/10/2019   AST 21 06/10/2019   ALT 26 06/10/2019   PROT 7.7 06/10/2019   ALBUMIN 4.6 06/10/2019   CALCIUM 9.4 03/30/2020   ANIONGAP 14 03/30/2020   Lab Results  Component Value Date   CHOL 146 05/18/2020   Lab Results  Component Value Date   HDL 37 (L) 05/18/2020   Lab Results  Component Value Date   LDLCALC 82 05/18/2020   Lab Results  Component Value Date   TRIG 154 (H) 05/18/2020   Lab Results  Component Value Date   CHOLHDL 3.9 05/18/2020   No results found for: HGBA1C    Assessment & Plan:  Diagnoses and all orders for this visit:  Erectile disorder due to medical condition in male Erectile dysfunction (ED) is the inability to get or keep an  erection firm enough to have sexual intercourse.  This is a new problem. Additional work-up: No. We discussed different medications for ED including Viagra, Levitra, and Cialis. We also discussed the risks of such medications, including priapism, monocular vision loss, and hypotension with nitrates. All questions were answered. The patient opted for:  []   Trial of Viagra; no contraindications. [x]   Trial of Cialis; no contraindications. []   Trial of Levitra; no contraindications. []   Referral to Urology for further evaluation.  .Essential hypertension Counseled on blood pressure goal of less than 130/80, low-sodium, DASH diet, medication compliance, 150 minutes of moderate intensity exercise per week. Discussed medication compliance, adverse effects.  Follow-up: Return in about 6 months (around  12/09/2020) for HTN.    [name], NP

## 2020-06-09 ENCOUNTER — Encounter (INDEPENDENT_AMBULATORY_CARE_PROVIDER_SITE_OTHER): Payer: Self-pay | Admitting: Primary Care

## 2020-06-10 ENCOUNTER — Other Ambulatory Visit (INDEPENDENT_AMBULATORY_CARE_PROVIDER_SITE_OTHER): Payer: Self-pay | Admitting: Primary Care

## 2020-06-10 DIAGNOSIS — N521 Erectile dysfunction due to diseases classified elsewhere: Secondary | ICD-10-CM

## 2020-06-10 MED ORDER — TADALAFIL 10 MG PO TABS
10.0000 mg | ORAL_TABLET | ORAL | 1 refills | Status: DC | PRN
Start: 2020-06-10 — End: 2022-10-11

## 2020-06-10 NOTE — Telephone Encounter (Signed)
Medication sent to the correct pharmacy. In the future let me know you have switch pharmacy and you will not have to go through this

## 2020-06-15 ENCOUNTER — Encounter (INDEPENDENT_AMBULATORY_CARE_PROVIDER_SITE_OTHER): Payer: Self-pay | Admitting: Primary Care

## 2020-06-17 ENCOUNTER — Other Ambulatory Visit (INDEPENDENT_AMBULATORY_CARE_PROVIDER_SITE_OTHER): Payer: Self-pay | Admitting: Primary Care

## 2020-06-17 DIAGNOSIS — Z8739 Personal history of other diseases of the musculoskeletal system and connective tissue: Secondary | ICD-10-CM

## 2020-06-17 MED ORDER — ALLOPURINOL 300 MG PO TABS: 300.0000 mg | ORAL_TABLET | Freq: Every day | ORAL | 0 refills | Status: DC

## 2020-06-17 NOTE — Telephone Encounter (Signed)
Future visit in 5 months . 

## 2020-06-17 NOTE — Patient Outreach (Signed)
Asked Wayne [name] at PCP office to send Allopurinol to [facility] for refills

## 2020-06-17 NOTE — Telephone Encounter (Signed)
Medication Refill - Medication:   allopurinol (ZYLOPRIM) 300 MG tablet   Has the patient contacted their pharmacy? Yes.  contact office.   Preferred Pharmacy (with phone number or street name):   [facility] - [place], [place] - [address]  [address] [place] [address]  Phone: [phone] Fax: [phone]    Agent: Please be advised that RX refills may take up to 3 business days. We ask that you follow-up with your pharmacy.

## 2020-06-25 ENCOUNTER — Other Ambulatory Visit (INDEPENDENT_AMBULATORY_CARE_PROVIDER_SITE_OTHER): Payer: Self-pay | Admitting: Primary Care

## 2020-06-25 DIAGNOSIS — Z8739 Personal history of other diseases of the musculoskeletal system and connective tissue: Secondary | ICD-10-CM

## 2020-06-27 ENCOUNTER — Other Ambulatory Visit (INDEPENDENT_AMBULATORY_CARE_PROVIDER_SITE_OTHER): Payer: Self-pay | Admitting: Primary Care

## 2020-06-27 DIAGNOSIS — N529 Male erectile dysfunction, unspecified: Secondary | ICD-10-CM

## 2020-06-28 ENCOUNTER — Other Ambulatory Visit: Payer: Self-pay | Admitting: Obstetrics and Gynecology

## 2020-06-28 NOTE — Patient Instructions (Signed)
Visit Information    Hi Wayne [name], sorry we missed you today  - as a part of your Medicaid benefit, you are eligible for care management and care coordination services at no cost or copay. I was unable to reach you by phone today but would be happy to help you with your health related needs. Please feel free to call me at [phone].  A member of the Managed Medicaid care management team will reach out to you again over the next 7 -14 days.   [name] RN, BSN [place]  [facility] - Managed Medicaid High Risk [phone].

## 2020-06-28 NOTE — Patient Outreach (Signed)
Care Coordination  06/28/2020  BEAUX WEDEMEYER 1960-11-20 [id number]    Medicaid Managed Care   Unsuccessful Outreach Note  06/28/2020 Name: Wayne [name] MRN: [id number] DOB: Dec 31, 1960  Referred by: [name], NP Reason for referral : High Risk Managed Medicaid (Unsuccessfull telephone outreach)   An unsuccessful telephone outreach was attempted today. The patient was referred to the case management team for assistance with care management and care coordination.   Follow Up Plan: A member of the Managed Medicaid care management team will reach out to the patient again over the next 7-14 days.   [name] RN, BSN [place]  [facility] - Managed Medicaid High Risk [phone].

## 2020-07-12 ENCOUNTER — Other Ambulatory Visit: Payer: Medicaid Other

## 2020-07-12 NOTE — Patient Outreach (Signed)
Due for meds on 07/19/20. Tried to call patient but no answer. Left VM to call me back  Allopurinol   300mg   1    Amlodipine   10mg   1    Atorvastatin   80mg     1  Bupropion   150mg   1  1  Hydrochlorothiazide  25mg   1    Losartan   100mg     1

## 2020-07-15 ENCOUNTER — Other Ambulatory Visit: Payer: Self-pay

## 2020-07-15 ENCOUNTER — Other Ambulatory Visit: Payer: Self-pay | Admitting: Obstetrics and Gynecology

## 2020-07-15 NOTE — Patient Outreach (Signed)
Medicaid Managed Care   Nurse Care Manager Note  07/15/2020 Name:  Wayne [name] MRN:  [id number] DOB:  Mar 07, 1960  Wayne [name] is an 60 y.o. year old male who is a primary patient of [name], NP.  The [facility] Managed Care Coordination team was consulted for assistance with:    Chronic healthcare management needs.  Wayne [name] was given information about Medicaid Managed Care Coordination team services today. Wayne [name] agreed to services and verbal consent obtained.  Engaged with patient by telephone for follow up visit in response to provider referral for case management and/or care coordination services.   Assessments/Interventions:  Review of past medical history, allergies, medications, health status, including review of consultants reports, laboratory and other test data, was performed as part of comprehensive evaluation and provision of chronic care management services.  SDOH (Social Determinants of Health) assessments and interventions performed:   Care Plan  Allergies  Allergen Reactions   Hydralazine Other (See Comments)    Headache    Medications Reviewed Today     Reviewed by Gayla Medicus, RN (Registered Nurse) on 07/15/20 at 1445  Med List Status: <None>   Medication Order Taking? Sig Documenting Provider Last Dose Status Informant  allopurinol (ZYLOPRIM) 300 MG tablet [id number] Yes TAKE 1 TABLET(300 MG) BY MOUTH DAILY [name], NP Taking Active   amLODipine (NORVASC) 10 MG tablet [id number] Yes Take 1 tablet (10 mg total) by mouth daily. [name], NP Taking Active   atorvastatin (LIPITOR) 40 MG tablet [id number] Yes Take 1 tablet (40 mg total) by mouth daily. [name], NP Taking Active   buPROPion [facility] SR) 150 MG 12 hr tablet [id number] Yes Take 1 tablet (150 mg total) by mouth 2 (two) times daily.  Patient taking differently: Take 150 mg by mouth 2 (two) times daily. Takes occassionally   [name], NP Taking Active Self  hydrochlorothiazide (HYDRODIURIL) 25 MG tablet [id number] Yes Take 1 tablet (25 mg total) by mouth daily. [name], NP Taking Active   losartan (COZAAR) 100 MG tablet [id number] Yes TAKE 1 TABLET(100 MG) BY MOUTH DAILY [name], NP Taking Active   tadalafil (CIALIS) 10 MG tablet [id number] Yes Take 1 tablet (10 mg total) by mouth every other day as needed for erectile dysfunction. [name], NP Taking Active   Med List Note [name], NP 06/27/20 1021):                Patient Active Problem List   Diagnosis Date Noted   Prostate cancer ([place]) 04/04/2020   Malignant neoplasm of prostate ([place]) 01/17/2020   Acute gout due to renal impairment involving hand 08/12/2017   Essential hypertension 08/12/2017   Unilateral primary osteoarthritis, left knee 05/25/2016   Unilateral primary osteoarthritis, right knee 05/25/2016    Conditions to be addressed/monitored per PCP order:   chronic healthcare management  needs, h/o prostate cancer, osteoarthritis, HTN.  Care Plan : General Plan of Care (Adult)  Updates made by Gayla Medicus, RN since 07/15/2020 12:00 AM     Problem: Health Promotion or Disease Self-Management (General Plan of Care)   Priority: High  Onset Date: 04/25/2020     Long-Range Goal: Self-Management Plan Developed   Start Date: 04/25/2020  Expected End Date: 10/15/2020  Recent Progress: On track  Priority: Medium  Note:    Current Barriers:   Knowledge Deficits related to short term plan for care coordination needs  and long term plans for chronic disease management needs.  Patient with recent diagnosis of prostate cancer s/p prostatectomy 04/04/20.  Patient complains of urinary leakage-will call urologist, [name]. Update 07/15/20:  Patient states leakage has improved, only with coughing, sneezing.  Has appt with [name] 07/27/20. Nurse Case Manager Clinical Goal(s):  patient will work with care  management team to address care coordination and chronic disease management needs related to Disease Management Educational Needs Care Coordination Medication Management and Education Medication Reconciliation Medication Assistance  Psychosocial Support   Interventions:  Evaluation of current treatment plan and patient's adherence to plan as established by provider. Provided education to patient re: post surgical pain. Update 05/03/20:  Patient states he is no longer having any post surgical pain-feels fine. Reviewed medications with patient.  Discussed OTC pain medications.  Patient states he has stopped Wellbutrin, but is restarting. Update 05/03/20:  Patient states he has restarted Wellbutrin.-still smoking Update 06/02/20:  Patient continues to smoke 1/2 ppd and is taking Wellbutrin. Update 07/15/20:  No change-smoking approximately 1/2 ppd, takes Wellbutrin occasionally. Collaborated with Pharmacy  regarding medications. Discussed plans with patient for ongoing care management follow up and provided patient with direct contact information for care management team Reviewed scheduled/upcoming provider appointments.  Encouraged patient to follow up with Dr. Lynne Logan office for com[plaints of urinary leakage and to find out when next appointment is. Update 07/15/20:  Next appt is 07/27/20. Pharmacy referral for medication review Self Care Activities:  Patient will self administer medications as prescribed Patient will attend all scheduled provider appointments Patient will call pharmacy for medication refills Patient will call provider office for new concerns or questions Patient Goals: Over the next 30 days, patient will restart Wellbutrin Update 05/03/20:  Patient has restarted Wellbutrin. Over the next 30 days, patient will attend all provider appointments. Over the next 30 days, patient will speak with Pharmacist regarding medications. Update 05/03/20:  Patient met with Pharmacist 04/27/20  and continues to follow. - Follow Up Plan: The care management team will reach out to the patient again over the next 30 days.    Evidence-based guidance:   Review biopsychosocial determinants of health screens.  Review need for preventive screening based on age, sex, family history and health history.  Determine level of modifiable health risk.  Discuss identified risks.  Identify areas where behavior change may lead to improved health.  Promote healthy lifestyle.  Evoke change talk using open-ended questions, pros and cons, as well as looking forward.  Identify and manage conditions or preconditions to reduce health risk.  Implement additional goals and interventions based on identified risk factors.      Follow Up:  Patient agrees to Care Plan and Follow-up.  Plan: The Managed Medicaid care management team will reach out to the patient again over the next 30 days. and The patient has been provided with contact information for the Managed Medicaid care management team and has been advised to call with any health related questions or concerns.  Date/time of next scheduled RN care management/care coordination outreach: 08/10/20 at 230.

## 2020-07-15 NOTE — Patient Instructions (Signed)
Hi Wayne [name], thank you for speaking with me today-I am glad you are doing better  Wayne [name] was given information about Medicaid Managed Care team care coordination services as a part of their [place] Medicaid benefit. Wayne [name] verbally consented to engagement with the [facility] Managed Care team.   For questions related to your [facility], please call: [phone] or visit the homepage here: [email or web address]  If you would like to schedule transportation through your [facility], please call the following number at least 2 days in advance of your appointment: [phone].   Call the [facility] at [phone], at any time, 24 hours a day, 7 days a week. If you are in danger or need immediate medical attention call 911.  Wayne [name] - following are the goals we discussed in your visit today:   Goals Addressed             This Visit's Progress    Disease Progression Prevented or Minimized       Update 05/03/20:  patient feeling well post surgery-no complaints. Update 06/02/20:  patient continues to do well since surgery-no complaints except for urine leakage-will follow up with [name] Update 07/15/20:  Patient states urinary leakage is less, only when coughing or sneezing. Has appt with [name] 07/27/20.  Evidence-based guidance:   Prepare patient for laboratory tests, such as lipid profile, microalbuminuria, estimated glomerular filtration rate and diagnostic exams based on risk and presentation.  Assess signs/symptoms and risk factors for hypertension, dyslipidemia, nephropathy, neuropathy and retinopathy.  Encourage lifestyle changes, such as increased intake of plant-based foods, stress reduction, regular physical activity and smoking cessation to prevent long-term complications and chronic diseases.  Discourage  sedentary behavior or sitting longer than 30 minutes without interruption; individualize exercise or activity recommendations with potential limitations, such as neuropathy, retinopathy or the ability to prevent   hyperglycemia or hypoglycemia.  Prepare patient for use of pharmacologic therapy that may include antihypertensive, analgesic, statin (after age 47 years); anticipate periodic adjustments based on chronic condition and laboratory results Implement additional individualized goals and interventions based on identified risk factors.  Prepare patient for consultation or referral for specialist care, such as ophthalmology, neurology, cardiology, podiatry, nephrology or perinatology.      Patient verbalizes understanding of instructions provided today.   The Managed Medicaid care management team will reach out to the patient again over the next 30 days.  The patient has been provided with contact information for the Managed Medicaid care management team and has been advised to call with any health related questions or concerns.   [name] RN, BSN [place] [employer]  [facility] [employer] - Managed Medicaid High Risk [phone].    Following is a copy of your plan of care:  Patient Care Plan: General Plan of Care (Adult)     Problem Identified: Health Promotion or Disease Self-Management (General Plan of Care)   Priority: High  Onset Date: 04/25/2020     Long-Range Goal: Self-Management Plan Developed   Start Date: 04/25/2020  Expected End Date: 10/15/2020  Recent Progress: On track  Priority: Medium  Note:    Current Barriers:   Knowledge Deficits related to short term plan for care coordination needs and long term plans for chronic disease management needs.  Patient with recent diagnosis of prostate cancer s/p prostatectomy 04/04/20.  Patient complains of urinary leakage-will call urologist, [name]. Update 07/15/20:  Patient states leakage has  improved, only with coughing, sneezing.  Has appt with [name] 07/27/20. Nurse Case Manager Clinical Goal(s):  patient will work with care management team to address care coordination and chronic disease management needs related to Disease Management Educational Needs Care Coordination Medication Management and Education Medication Reconciliation Medication Assistance  Psychosocial Support   Interventions:  Evaluation of current treatment plan and patient's adherence to plan as established by provider. Provided education to patient re: post surgical pain. Update 05/03/20:  Patient states he is no longer having any post surgical pain-feels fine. Reviewed medications with patient.  Discussed OTC pain medications.  Patient states he has stopped Wellbutrin, but is restarting. Update 05/03/20:  Patient states he has restarted Wellbutrin.-still smoking Update 06/02/20:  Patient continues to smoke 1/2 ppd and is taking Wellbutrin. Update 07/15/20:  No change-smoking approximately 1/2 ppd, takes Wellbutrin occasionally. Collaborated with Pharmacy  regarding medications. Discussed plans with patient for ongoing care management follow up and provided patient with direct contact information for care management team Reviewed scheduled/upcoming provider appointments.  Encouraged patient to follow up with Dr. Lynne Logan office for com[plaints of urinary leakage and to find out when next appointment is. Update 07/15/20:  Next appt is 07/27/20. Pharmacy referral for medication review Self Care Activities:  Patient will self administer medications as prescribed Patient will attend all scheduled provider appointments Patient will call pharmacy for medication refills Patient will call provider office for new concerns or questions Patient Goals: Over the next 30 days, patient will restart Wellbutrin Update 05/03/20:  Patient has restarted Wellbutrin. Over the next 30 days, patient will attend all provider  appointments. Over the next 30 days, patient will speak with Pharmacist regarding medications. Update 05/03/20:  Patient met with Pharmacist 04/27/20 and continues to follow. - Follow Up Plan: The care management team will reach out to the patient again over the next 30 days.    Evidence-based guidance:   Review biopsychosocial determinants of health screens.  Review need for preventive screening based on age, sex, family history and health history.  Determine level of modifiable health risk.  Discuss identified risks.  Identify areas where behavior change may lead to improved health.  Promote healthy lifestyle.  Evoke change talk using open-ended questions, pros and cons, as well as looking forward.  Identify and manage conditions or preconditions to reduce health risk.  Implement additional goals and interventions based on identified risk factors.

## 2020-07-27 DIAGNOSIS — C61 Malignant neoplasm of prostate: Secondary | ICD-10-CM | POA: Diagnosis not present

## 2020-08-10 ENCOUNTER — Other Ambulatory Visit: Payer: Self-pay | Admitting: Obstetrics and Gynecology

## 2020-08-10 ENCOUNTER — Other Ambulatory Visit: Payer: Self-pay

## 2020-08-10 NOTE — Patient Outreach (Signed)
Medicaid Managed Care   Nurse Care Manager Note  08/10/2020 Name:  Wayne [name] MRN:  [id number] DOB:  09-28-1960  Wayne [name] is an 60 y.o. year old male who is a primary patient of [name], NP.  The [facility] Managed Care Coordination team was consulted for assistance with:    Chronic healthcare management needs.  Mr. Kilmer was given information about Medicaid Managed Care Coordination team services today. Wayne [name] agreed to services and verbal consent obtained.  Engaged with patient by telephone for follow up visit in response to provider referral for case management and/or care coordination services.   Assessments/Interventions:  Review of past medical history, allergies, medications, health status, including review of consultants reports, laboratory and other test data, was performed as part of comprehensive evaluation and provision of chronic care management services.  SDOH (Social Determinants of Health) assessments and interventions performed:   Care Plan  Allergies  Allergen Reactions   Hydralazine Other (See Comments)    Headache    Medications Reviewed Today     Reviewed by Gayla Medicus, RN (Registered Nurse) on 08/10/20 at 1350  Med List Status: <None>   Medication Order Taking? Sig Documenting Provider Last Dose Status Informant  allopurinol (ZYLOPRIM) 300 MG tablet [id number] No TAKE 1 TABLET(300 MG) BY MOUTH DAILY [name], NP Taking Active   amLODipine (NORVASC) 10 MG tablet [id number] No Take 1 tablet (10 mg total) by mouth daily. [name], NP Taking Active   atorvastatin (LIPITOR) 40 MG tablet [id number] No Take 1 tablet (40 mg total) by mouth daily. [name], NP Taking Active   buPROPion [facility] SR) 150 MG 12 hr tablet [id number] No Take 1 tablet (150 mg total) by mouth 2 (two) times daily.  Patient taking differently: Take 150 mg by mouth 2 (two) times daily. Takes occassionally   [name],  NP Taking Active Self  hydrochlorothiazide (HYDRODIURIL) 25 MG tablet [id number] No Take 1 tablet (25 mg total) by mouth daily. [name], NP Taking Active   losartan (COZAAR) 100 MG tablet [id number] No TAKE 1 TABLET(100 MG) BY MOUTH DAILY [name], NP Taking Active   tadalafil (CIALIS) 10 MG tablet [id number] No Take 1 tablet (10 mg total) by mouth every other day as needed for erectile dysfunction. [name], NP Taking Active   Med List Note [name], NP 06/27/20 1021):                Patient Active Problem List   Diagnosis Date Noted   Prostate cancer ([place]) 04/04/2020   Malignant neoplasm of prostate ([place]) 01/17/2020   Acute gout due to renal impairment involving hand 08/12/2017   Essential hypertension 08/12/2017   Unilateral primary osteoarthritis, left knee 05/25/2016   Unilateral primary osteoarthritis, right knee 05/25/2016    Conditions to be addressed/monitored per PCP order:   chronic healthcare management needs, h/p prostate cancer s/p prostatectmy, tobacco use, HTN, gout, osteoarthritis.  Care Plan : General Plan of Care (Adult)  Updates made by Gayla Medicus, RN since 08/10/2020 12:00 AM     Problem: Health Promotion or Disease Self-Management (General Plan of Care)   Priority: High  Onset Date: 04/25/2020     Long-Range Goal: Self-Management Plan Developed   Start Date: 04/25/2020  Expected End Date: 10/15/2020  Recent Progress: On track  Priority: Medium  Note:    Current Barriers:   Knowledge Deficits related to short term plan  for care coordination needs and long term plans for chronic disease management needs.  Patient with recent diagnosis of prostate cancer s/p prostatectomy 04/04/20.  Patient complains of urinary leakage-will call urologist, [name]. Update 07/15/20:  Patient states leakage has improved, only with coughing, sneezing.  Has appt with [name] 07/27/20. Update 08/10/20:  Urinary leakage continues to  improve.  Will see [name] twice a year for 5 years, patient cancer free at this time. Nurse Case Manager Clinical Goal(s):  patient will work with care management team to address care coordination and chronic disease management needs related to Disease Management Educational Needs Care Coordination Medication Management and Education Medication Reconciliation Medication Assistance  Psychosocial Support   Interventions:  Evaluation of current treatment plan and patient's adherence to plan as established by provider. Provided education to patient re: post surgical pain. Update 05/03/20:  Patient states he is no longer having any post surgical pain-feels fine. Reviewed medications with patient.  Discussed OTC pain medications.  Patient states he has stopped Wellbutrin, but is restarting. Update 05/03/20:  Patient states he has restarted Wellbutrin.-still smoking Update 06/02/20:  Patient continues to smoke 1/2 ppd and is taking Wellbutrin. Update 07/15/20:  No change-smoking approximately 1/2 ppd, takes Wellbutrin occasionally. Update 08/10/20:  smokes 4 cigarettes -1/2 ppd.  States Wellbutrin does not seem as effective as before.  States he will contact PCP about patch .Collaborated with Pharmacy  regarding medications. Discussed plans with patient for ongoing care management follow up and provided patient with direct contact information for care management team Reviewed scheduled/upcoming provider appointments.  Encouraged patient to follow up with Dr. Lynne Logan office for com[plaints of urinary leakage and to find out when next appointment is. Update 07/15/20:  Next appt is 07/27/20. Update 08/10/20:  Patient seen and evaluated by [name]. Pharmacy referral for medication review Self Care Activities:  Patient will self administer medications as prescribed Patient will attend all scheduled provider appointments Patient will call pharmacy for medication refills Patient will call provider  office for new concerns or questions Patient Goals: Over the next 30 days, patient will restart Wellbutrin Update 05/03/20:  Patient has restarted Wellbutrin. Over the next 30 days, patient will attend all provider appointments. Over the next 30 days, patient will speak with Pharmacist regarding medications. Update 05/03/20:  Patient met with Pharmacist 04/27/20 and continues to follow. - Follow Up Plan: The care management team will reach out to the patient again over the next 30 days.    Evidence-based guidance:   Review biopsychosocial determinants of health screens.  Review need for preventive screening based on age, sex, family history and health history.  Determine level of modifiable health risk.  Discuss identified risks.  Identify areas where behavior change may lead to improved health.  Promote healthy lifestyle.  Evoke change talk using open-ended questions, pros and cons, as well as looking forward.  Identify and manage conditions or preconditions to reduce health risk.  Implement additional goals and interventions based on identified risk factors.       Follow Up:  Patient agrees to Care Plan and Follow-up.  Plan: The Managed Medicaid care management team will reach out to the patient again over the next 30 days. and The patient has been provided with contact information for the Managed Medicaid care management team and has been advised to call with any health related questions or concerns.  Date/time of next scheduled RN care management/care coordination outreach: 09/06/20 at 1245.

## 2020-08-10 NOTE — Patient Instructions (Signed)
Hi Mr, Jezewski, thank you for speaking with me today.  Mr. Newbury was given information about Medicaid Managed Care team care coordination services as a part of their [place] Medicaid benefit. [name] verbally consented to engagement with the [facility] Managed Care team.   For questions related to your [facility], please call: [phone] or visit the homepage here: [email or web address]  If you would like to schedule transportation through your [facility], please call the following number at least 2 days in advance of your appointment: [phone].   Call the [place] at [phone], at any time, 24 hours a day, 7 days a week. If you are in danger or need immediate medical attention call 911.  If you would like help to quit smoking, call 1-800-QUIT-NOW 412-035-3499) OR Espaol: 1-855-Djelo-Ya (1-216-244-6950) o para ms informacin haga clic aqu or Text READY to 200-400 to register via text  Mr. Rosenbloom - following are the goals we discussed in your visit today:   Goals Addressed             This Visit's Progress    Disease Progression Prevented or Minimized       Update 05/03/20:  patient feeling well post surgery-no complaints. Update 06/02/20:  patient continues to do well since surgery-no complaints except for urine leakage-will follow up with [name] Update 07/15/20:  Patient states urinary leakage is less, only when coughing or sneezing. Has appt with [name] 07/27/20. Update 08/10/20:  Urinary leakage less-saw [name], will follow up twice a year for 5 years.  Evidence-based guidance:   Prepare patient for laboratory tests, such as lipid profile, microalbuminuria, estimated glomerular filtration rate and diagnostic exams based on risk and presentation.  Assess signs/symptoms and risk factors for  hypertension, dyslipidemia, nephropathy, neuropathy and retinopathy.  Encourage lifestyle changes, such as increased intake of plant-based foods, stress reduction, regular physical activity and smoking cessation to prevent long-term complications and chronic diseases.  Discourage sedentary behavior or sitting longer than 30 minutes without interruption; individualize exercise or activity recommendations with potential limitations, such as neuropathy, retinopathy or the ability to prevent   hyperglycemia or hypoglycemia.  Prepare patient for use of pharmacologic therapy that may include antihypertensive, analgesic, statin (after age 74 years); anticipate periodic adjustments based on chronic condition and laboratory results Implement additional individualized goals and interventions based on identified risk factors.  Prepare patient for consultation or referral for specialist care, such as ophthalmology, neurology, cardiology, podiatry, nephrology or perinatology.           Patient verbalizes understanding of instructions provided today.   The Managed Medicaid care management team will reach out to the patient again over the next 30 days.  The  Patient has been provided with contact information for the Managed Medicaid care management team and has been advised to call with any health related questions or concerns.   [name] RN, BSN [place]  [facility] - Managed Medicaid High Risk [phone].   Following is a copy of your plan of care:  Patient Care Plan: General Plan of Care (Adult)     Problem Identified: Health Promotion or Disease Self-Management (General Plan of Care)   Priority: High  Onset Date: 04/25/2020     Long-Range Goal: Self-Management Plan Developed   Start Date: 04/25/2020  Expected End Date: 10/15/2020  Recent Progress: On track  Priority: Medium  Note:  Current Barriers:   Knowledge Deficits related to short  term plan for care coordination needs and long term plans for chronic disease management needs.  Patient with recent diagnosis of prostate cancer s/p prostatectomy 04/04/20.  Patient complains of urinary leakage-will call urologist, [name]. Update 07/15/20:  Patient states leakage has improved, only with coughing, sneezing.  Has appt with [name] 07/27/20. Update 08/10/20:  Urinary leakage continues to improve.  Will see [name] twice a year for 5 years, patient cancer free at this time. Nurse Case Manager Clinical Goal(s):  patient will work with care management team to address care coordination and chronic disease management needs related to Disease Management Educational Needs Care Coordination Medication Management and Education Medication Reconciliation Medication Assistance  Psychosocial Support   Interventions:  Evaluation of current treatment plan and patient's adherence to plan as established by provider. Provided education to patient re: post surgical pain. Update 05/03/20:  Patient states he is no longer having any post surgical pain-feels fine. Reviewed medications with patient.  Discussed OTC pain medications.  Patient states he has stopped Wellbutrin, but is restarting. Update 05/03/20:  Patient states he has restarted Wellbutrin.-still smoking Update 06/02/20:  Patient continues to smoke 1/2 ppd and is taking Wellbutrin. Update 07/15/20:  No change-smoking approximately 1/2 ppd, takes Wellbutrin occasionally. Update 08/10/20:  smokes 4 cigarettes -1/2 ppd.  States Wellbutrin does not seem as effective as before.  States he will contact PCP about patch .Collaborated with Pharmacy  regarding medications. Discussed plans with patient for ongoing care management follow up and provided patient with direct contact information for care management team Reviewed scheduled/upcoming provider appointments.  Encouraged patient to follow up with Dr. Lynne Logan office for com[plaints of urinary  leakage and to find out when next appointment is. Update 07/15/20:  Next appt is 07/27/20. Update 08/10/20:  Patient seen and evaluated by [name]. Pharmacy referral for medication review Self Care Activities:  Patient will self administer medications as prescribed Patient will attend all scheduled provider appointments Patient will call pharmacy for medication refills Patient will call provider office for new concerns or questions Patient Goals: Over the next 30 days, patient will restart Wellbutrin Update 05/03/20:  Patient has restarted Wellbutrin. Over the next 30 days, patient will attend all provider appointments. Over the next 30 days, patient will speak with Pharmacist regarding medications. Update 05/03/20:  Patient met with Pharmacist 04/27/20 and continues to follow. - Follow Up Plan: The care management team will reach out to the patient again over the next 30 days.    Evidence-based guidance:   Review biopsychosocial determinants of health screens.  Review need for preventive screening based on age, sex, family history and health history.  Determine level of modifiable health risk.  Discuss identified risks.  Identify areas where behavior change may lead to improved health.  Promote healthy lifestyle.  Evoke change talk using open-ended questions, pros and cons, as well as looking forward.  Identify and manage conditions or preconditions to reduce health risk.  Implement additional goals and interventions based on identified risk factors.

## 2020-08-15 ENCOUNTER — Other Ambulatory Visit: Payer: Self-pay

## 2020-08-15 NOTE — Patient Outreach (Signed)
Patient called back, can't afford meds on Friday. Will deliver Friday and he'll pay monday

## 2020-08-15 NOTE — Patient Outreach (Signed)
Meds due on 08/19/20 but patient didn't answer, will proceed as planned  Allopurinol   300 mg  1    Amlodipine   10 mg  1    Atorvastatin   80 mg    1  Bupropion   150 mg  1  1  HCTZ   25 mg  1    Losartan   100 mg    1

## 2020-08-19 ENCOUNTER — Other Ambulatory Visit (INDEPENDENT_AMBULATORY_CARE_PROVIDER_SITE_OTHER): Payer: Self-pay | Admitting: Primary Care

## 2020-08-19 DIAGNOSIS — I10 Essential (primary) hypertension: Secondary | ICD-10-CM

## 2020-08-19 DIAGNOSIS — Z760 Encounter for issue of repeat prescription: Secondary | ICD-10-CM

## 2020-08-19 MED ORDER — LOSARTAN POTASSIUM 100 MG PO TABS: ORAL_TABLET | ORAL | 1 refills | Status: DC

## 2020-08-19 MED ORDER — AMLODIPINE BESYLATE 10 MG PO TABS: 10.0000 mg | ORAL_TABLET | Freq: Every day | ORAL | 1 refills | Status: DC

## 2020-08-19 NOTE — Telephone Encounter (Signed)
Copied from [place] [phone]. Topic: Quick Communication - Rx Refill/Question >> Aug 19, 2020  9:44 AM Tessa Lerner A wrote: Medication: losartan (COZAAR) 100 MG tablet   amLODipine (NORVASC) 10 MG tablet   Has the patient contacted their pharmacy? Yes.   (Agent: If no, request that the patient contact the pharmacy for the refill.) (Agent: If yes, when and what did the pharmacy advise?)  Preferred Pharmacy (with phone number or street name): [facility] - [place], [place] - [address]  Phone:  [phone] Fax:  [phone]   Agent: Please be advised that RX refills may take up to 3 business days. We ask that you follow-up with your pharmacy.

## 2020-09-06 ENCOUNTER — Other Ambulatory Visit: Payer: Self-pay

## 2020-09-06 ENCOUNTER — Other Ambulatory Visit: Payer: Self-pay | Admitting: Obstetrics and Gynecology

## 2020-09-06 NOTE — Patient Outreach (Signed)
Medicaid Managed Care   Nurse Care Manager Note  09/06/2020 Name:  Wayne [name] MRN:  [id number] DOB:  July 29, 1960  Wayne [name] is an 60 y.o. year old male who is a primary patient of Wayne Perna, NP.  The [facility] Managed Care Coordination team was consulted for assistance with:    Chronic healthcare management needs.  [name] was given information about Medicaid Managed Care Coordination team services today. Wayne [name] Patient agreed to services and verbal consent obtained.  Engaged with patient by telephone for follow up visit in response to provider referral for case management and/or care coordination services.   Assessments/Interventions:  Review of past medical history, allergies, medications, health status, including review of consultants reports, laboratory and other test data, was performed as part of comprehensive evaluation and provision of chronic care management services.  SDOH (Social Determinants of Health) assessments and interventions performed: SDOH Interventions    Flowsheet Row Most Recent Value  SDOH Interventions   Food Insecurity Interventions Intervention Not Indicated  Financial Strain Interventions Intervention Not Indicated  Housing Interventions Intervention Not Indicated  Intimate Partner Violence Interventions Intervention Not Indicated  Physical Activity Interventions Intervention Not Indicated  Stress Interventions Intervention Not Indicated  Social Connections Interventions Intervention Not Indicated  Transportation Interventions Intervention Not Indicated       Care Plan  Allergies  Allergen Reactions   Hydralazine Other (See Comments)    Headache    Medications Reviewed Today     Reviewed by Gayla Medicus, RN (Registered Nurse) on 09/06/20 at 1246  Med List Status: <None>   Medication Order Taking? Sig Documenting Provider Last Dose Status Informant  allopurinol (ZYLOPRIM) 300 MG tablet [id number] No TAKE 1  TABLET(300 MG) BY MOUTH DAILY Wayne Perna, NP Taking Active   amLODipine (NORVASC) 10 MG tablet [id number]  Take 1 tablet (10 mg total) by mouth daily. Wayne Perna, NP  Active   atorvastatin (LIPITOR) 40 MG tablet [id number] No Take 1 tablet (40 mg total) by mouth daily. Wayne Perna, NP Taking Active   buPROPion [facility] SR) 150 MG 12 hr tablet [id number] No Take 1 tablet (150 mg total) by mouth 2 (two) times daily.  Patient taking differently: Take 150 mg by mouth 2 (two) times daily. Takes occassionally   Wayne Perna, NP Taking Active Self  hydrochlorothiazide (HYDRODIURIL) 25 MG tablet [id number] No Take 1 tablet (25 mg total) by mouth daily. Wayne Perna, NP Taking Active   losartan (COZAAR) 100 MG tablet [id number]  TAKE 1 TABLET(100 MG) BY MOUTH DAILY Wayne Perna, NP  Active   tadalafil (CIALIS) 10 MG tablet [id number] No Take 1 tablet (10 mg total) by mouth every other day as needed for erectile dysfunction. Wayne Perna, NP Taking Active   Med List Note Wayne Perna, NP 06/27/20 1021):                Patient Active Problem List   Diagnosis Date Noted   Prostate cancer ([place]) 04/04/2020   Malignant neoplasm of prostate ([place]) 01/17/2020   Acute gout due to renal impairment involving hand 08/12/2017   Essential hypertension 08/12/2017   Unilateral primary osteoarthritis, left knee 05/25/2016   Unilateral primary osteoarthritis, right knee 05/25/2016    Conditions to be addressed/monitored per PCP order:   chronic healthcare management needs, h/o prostate cancer, tobacco use, osteoarthritis, gout.  Care Plan : General Plan of Care (Adult)  Updates made by Corneluis Allston, Lorel Monaco,  RN since 09/06/2020 12:00 AM     Problem: Health Promotion or Disease Self-Management (General Plan of Care)   Priority: Medium  Onset Date: 04/25/2020     Long-Range Goal: Self-Management Plan Developed   Start Date: 04/25/2020  Expected End Date:  10/15/2020  Recent Progress: On track  Priority: Medium  Note:    Current Barriers:   Knowledge Deficits related to short term plan for care coordination needs and long term plans for chronic disease management needs.  Patient with recent diagnosis of prostate cancer s/p prostatectomy 04/04/20.  Patient complains of urinary leakage-will call urologist, [name]. Update 07/15/20:  Patient states leakage has improved, only with coughing, sneezing.  Has appt with [name] 07/27/20. Update 08/10/20:  Urinary leakage continues to improve.  Will see [name] twice a year for 5 years, patient cancer free at this time. Update 09/06/20:  Wearing one pad a day for urinary leakage, continues to decrease. Nurse Case Manager Clinical Goal(s):  patient will work with care management team to address care coordination and chronic disease management needs related to Disease Management Educational Needs Care Coordination Medication Management and Education Medication Reconciliation Medication Assistance  Psychosocial Support   Interventions:  Evaluation of current treatment plan and patient's adherence to plan as established by provider. Provided education to patient re: post surgical pain. Update 05/03/20:  Patient states he is no longer having any post surgical pain-feels fine. Reviewed medications with patient.  Discussed OTC pain medications.  Patient states he has stopped Wellbutrin, but is restarting. Update 05/03/20:  Patient states he has restarted Wellbutrin.-still smoking Update 06/02/20:  Patient continues to smoke 1/2 ppd and is taking Wellbutrin. Update 07/15/20:  No change-smoking approximately 1/2 ppd, takes Wellbutrin occasionally. Update 08/10/20:  smokes 4 cigarettes -1/2 ppd.  States Wellbutrin does not seem as effective as before.  States he will contact PCP about patch Update 09/06/20:  Smoking 1/2 ppd, has not called about patches. [name] with Pharmacy  regarding  medications. Discussed plans with patient for ongoing care management follow up and provided patient with direct contact information for care management team Reviewed scheduled/upcoming provider appointments.  Encouraged patient to follow up with Dr. Lynne Logan office for com[plaints of urinary leakage and to find out when next appointment is. Update 07/15/20:  Next appt is 07/27/20. Update 08/10/20:  Patient seen and evaluated by [name]. Pharmacy referral for medication review Self Care Activities:  Patient will self administer medications as prescribed Patient will attend all scheduled provider appointments Patient will call pharmacy for medication refills Patient will call provider office for new concerns or questions Patient Goals: Over the next 30 days, patient will restart Wellbutrin Update 05/03/20:  Patient has restarted Wellbutrin. Over the next 30 days, patient will attend all provider appointments. Over the next 30 days, patient will speak with Pharmacist regarding medications. Update 05/03/20:  Patient met with Pharmacist 04/27/20 and continues to follow. Update 09/06/20:  Patient very happy with Pharmacy services provided by [facility] and medication delivery. - Follow Up Plan: The care management team will reach out to the patient again over the next 30 days.    Evidence-based guidance:   Review biopsychosocial determinants of health screens.  Review need for preventive screening based on age, sex, family history and health history.  Determine level of modifiable health risk.  Discuss identified risks.  Identify areas where behavior change may lead to improved health.  Promote healthy lifestyle.  Evoke change talk using open-ended questions, pros and cons, as  well as looking forward.  Identify and manage conditions or preconditions to reduce health risk.  Implement additional goals and interventions based on identified risk factors.    Follow Up:  Patient agrees to Care Plan and  Follow-up.  Plan: The Managed Medicaid care management team will reach out to the patient again over the next 30 days. and The patient has been provided with contact information for the Managed Medicaid care management team and has been advised to call with any health related questions or concerns.  Date/time of next scheduled RN care management/care coordination outreach:  10/07/20 at 0900.

## 2020-09-06 NOTE — Patient Instructions (Signed)
Hi Mr. Santilli, thank you for speaking with me today, I am glad you are doing well.  Wayne [name] was given information about Medicaid Managed Care team care coordination services as a part of their [place] Medicaid benefit. Wayne [name] verbally consented to engagement with the [facility] Managed Care team.   If you are experiencing a medical emergency, please call 911 or report to your local emergency department or urgent care.   If you have a non-emergency medical problem during routine business hours, please contact your provider's office and ask to speak with a nurse.   For questions related to your [facility], please call: [phone] or visit the homepage here: [email or web address]  If you would like to schedule transportation through your [facility], please call the following number at least 2 days in advance of your appointment: [phone].   Call the [place] at [phone], at any time, 24 hours a day, 7 days a week. If you are in danger or need immediate medical attention call 911.  If you would like help to quit smoking, call 1-800-QUIT-NOW (206)839-7415) OR Espaol: 1-855-Djelo-Ya (4-132-440-1027) o para ms informacin haga clic aqu or Text READY to 200-400 to register via text  Wayne [name] - following are the goals we discussed in your visit today:   Goals Addressed             This Visit's Progress    Disease Progression Prevented or Minimized       Update 05/03/20:  patient feeling well post surgery-no complaints. Update 06/02/20:  patient continues to do well since surgery-no complaints except for urine leakage-will follow up with [name] Update 07/15/20:  Patient states urinary leakage is less, only when coughing or sneezing. Has appt with [name] 07/27/20. Update 08/10/20:  Urinary leakage less-saw Dr.  [name], will follow up twice a year for 5 years. Update 09/06/20:  Using one pad a day for urinary leakage-getting less.  Evidence-based guidance:   Prepare patient for laboratory tests, such as lipid profile, microalbuminuria, estimated glomerular filtration rate and diagnostic exams based on risk and presentation.  Assess signs/symptoms and risk factors for hypertension, dyslipidemia, nephropathy, neuropathy and retinopathy.  Encourage lifestyle changes, such as increased intake of plant-based foods, stress reduction, regular physical activity and smoking cessation to prevent long-term complications and chronic diseases.  Discourage sedentary behavior or sitting longer than 30 minutes without interruption; individualize exercise or activity recommendations with potential limitations, such as neuropathy, retinopathy or the ability to prevent   hyperglycemia or hypoglycemia.  Prepare patient for use of pharmacologic therapy that may include antihypertensive, analgesic, statin (after age 34 years); anticipate periodic adjustments based on chronic condition and laboratory results Implement additional individualized goals and interventions based on identified risk factors.  Prepare patient for consultation or referral for specialist care, such as ophthalmology, neurology, cardiology, podiatry, nephrology or perinatology.     The patient verbalized understanding of instructions provided today and declined a print copy of patient instruction materials.   The Managed Medicaid care management team will reach out to the patient again over the next 30 days.  The  Patient                                              has been provided with contact information for  the Managed Medicaid care management team and has been advised to call with any health related questions or concerns.   Wayne Raider RN, BSN [place]  [facility] - Managed Medicaid High  Risk [phone].   Following is a copy of your plan of care:  Patient Care Plan: General Plan of Care (Adult)     Problem Identified: Health Promotion or Disease Self-Management (General Plan of Care)   Priority: Medium  Onset Date: 04/25/2020     Long-Range Goal: Self-Management Plan Developed   Start Date: 04/25/2020  Expected End Date: 10/15/2020  Recent Progress: On track  Priority: Medium  Note:    Current Barriers:   Knowledge Deficits related to short term plan for care coordination needs and long term plans for chronic disease management needs.  Patient with recent diagnosis of prostate cancer s/p prostatectomy 04/04/20.  Patient complains of urinary leakage-will call urologist, [name]. Update 07/15/20:  Patient states leakage has improved, only with coughing, sneezing.  Has appt with [name] 07/27/20. Update 08/10/20:  Urinary leakage continues to improve.  Will see [name] twice a year for 5 years, patient cancer free at this time. Update 09/06/20:  Wearing one pad a day for urinary leakage, continues to decrease. Nurse Case Manager Clinical Goal(s):  patient will work with care management team to address care coordination and chronic disease management needs related to Disease Management Educational Needs Care Coordination Medication Management and Education Medication Reconciliation Medication Assistance  Psychosocial Support   Interventions:  Evaluation of current treatment plan and patient's adherence to plan as established by provider. Provided education to patient re: post surgical pain. Update 05/03/20:  Patient states he is no longer having any post surgical pain-feels fine. Reviewed medications with patient.  Discussed OTC pain medications.  Patient states he has stopped Wellbutrin, but is restarting. Update 05/03/20:  Patient states he has restarted Wellbutrin.-still smoking Update 06/02/20:  Patient continues to smoke 1/2 ppd and is taking Wellbutrin. Update  07/15/20:  No change-smoking approximately 1/2 ppd, takes Wellbutrin occasionally. Update 08/10/20:  smokes 4 cigarettes -1/2 ppd.  States Wellbutrin does not seem as effective as before.  States he will contact PCP about patch Update 09/06/20:  Smoking 1/2 ppd, has not called about patches. [name] with Pharmacy  regarding medications. Discussed plans with patient for ongoing care management follow up and provided patient with direct contact information for care management team Reviewed scheduled/upcoming provider appointments.  Encouraged patient to follow up with Dr. Lynne Logan office for com[plaints of urinary leakage and to find out when next appointment is. Update 07/15/20:  Next appt is 07/27/20. Update 08/10/20:  Patient seen and evaluated by [name]. Pharmacy referral for medication review Self Care Activities:  Patient will self administer medications as prescribed Patient will attend all scheduled provider appointments Patient will call pharmacy for medication refills Patient will call provider office for new concerns or questions Patient Goals: Over the next 30 days, patient will restart Wellbutrin Update 05/03/20:  Patient has restarted Wellbutrin. Over the next 30 days, patient will attend all provider appointments. Over the next 30 days, patient will speak with Pharmacist regarding medications. Update 05/03/20:  Patient met with Pharmacist 04/27/20 and continues to follow. Update 09/06/20:  Patient very happy with Pharmacy services provided by [facility] and medication delivery. - Follow Up Plan: The care management team will reach out to the patient again over the next 30 days.    Evidence-based guidance:   Review biopsychosocial  determinants of health screens.  Review need for preventive screening based on age, sex, family history and health history.  Determine level of modifiable health risk.  Discuss identified risks.  Identify areas where behavior change may lead to  improved health.  Promote healthy lifestyle.  Evoke change talk using open-ended questions, pros and cons, as well as looking forward.  Identify and manage conditions or preconditions to reduce health risk.  Implement additional goals and interventions based on identified risk factors.

## 2020-10-07 ENCOUNTER — Other Ambulatory Visit: Payer: Self-pay

## 2020-10-07 ENCOUNTER — Other Ambulatory Visit: Payer: Self-pay | Admitting: Obstetrics and Gynecology

## 2020-10-07 NOTE — Patient Outreach (Signed)
Medicaid Managed Care   Nurse Care Manager Note  10/07/2020 Name:  Wayne [name] MRN:  [id number] DOB:  [birth year]  Wayne [name] is an 60 y.o. year old male who is a primary patient of Wayne Perna, NP.  The [facility] Managed Care Coordination team was consulted for assistance with:    Chronic healthcare management needs.  [name] was given information about Medicaid Managed Care Coordination team services today. Wayne [name] Patient agreed to services and verbal consent obtained.  Engaged with patient by telephone for follow up visit in response to provider referral for case management and/or care coordination services.   Assessments/Interventions:  Review of past medical history, allergies, medications, health status, including review of consultants reports, laboratory and other test data, was performed as part of comprehensive evaluation and provision of chronic care management services.  SDOH (Social Determinants of Health) assessments and interventions performed:   Care Plan  Allergies  Allergen Reactions   Hydralazine Other (See Comments)    Headache    Medications Reviewed Today     Reviewed by Gayla Medicus, RN (Registered Nurse) on 10/07/20 at [phone]  Med List Status: <None>   Medication Order Taking? Sig Documenting Provider Last Dose Status Informant  allopurinol (ZYLOPRIM) 300 MG tablet [id number]  TAKE 1 TABLET(300 MG) BY MOUTH DAILY Wayne Perna, NP  Active   amLODipine (NORVASC) 10 MG tablet [id number]  Take 1 tablet (10 mg total) by mouth daily. Wayne Perna, NP  Active   atorvastatin (LIPITOR) 40 MG tablet [id number]  Take 1 tablet (40 mg total) by mouth daily. Wayne Perna, NP  Active   buPROPion [facility] SR) 150 MG 12 hr tablet [id number] No Take 1 tablet (150 mg total) by mouth 2 (two) times daily.  Patient not taking: Reported on 10/07/2020   Wayne Perna, NP Not Taking Active Self  hydrochlorothiazide (HYDRODIURIL) 25  MG tablet [id number]  Take 1 tablet (25 mg total) by mouth daily. Wayne Perna, NP  Active   losartan (COZAAR) 100 MG tablet [id number]  TAKE 1 TABLET(100 MG) BY MOUTH DAILY Wayne Perna, NP  Active   tadalafil (CIALIS) 10 MG tablet [id number]  Take 1 tablet (10 mg total) by mouth every other day as needed for erectile dysfunction. Wayne Perna, NP  Active   Med List Note Wayne Perna, NP 06/27/20 1021):                Patient Active Problem List   Diagnosis Date Noted   Prostate cancer ([place]) 04/04/2020   Malignant neoplasm of prostate ([place]) 01/17/2020   Acute gout due to renal impairment involving hand 08/12/2017   Essential hypertension 08/12/2017   Unilateral primary osteoarthritis, left knee 05/25/2016   Unilateral primary osteoarthritis, right knee 05/25/2016    Conditions to be addressed/monitored per PCP order:   chronic healthcare management needs, gout, HTN, tobacco use, h/o prostate cancer s/p prostatectomy, osteoarthritis.  Care Plan : General Plan of Care (Adult)  Updates made by Gayla Medicus, RN since 10/07/2020 12:00 AM     Problem: Health Promotion or Disease Self-Management (General Plan of Care)   Priority: Medium  Onset Date: 04/25/2020     Long-Range Goal: Self-Management Plan Developed   Start Date: 04/25/2020  Expected End Date: 01/06/2021  Recent Progress: On track  Priority: Medium  Note:    Current Barriers:   Knowledge Deficits related to short term plan for care coordination needs and long  term plans for chronic disease management needs.  Patient with recent diagnosis of prostate cancer s/p prostatectomy 04/04/20.  Patient complains of urinary leakage-will call urologist, [name].  Would like to stop smoking-has tried Wellbutrin-would like to try patch. Update 07/15/20:  Patient states leakage has improved, only with coughing, sneezing.  Has appt with [name] 07/27/20. Update 08/10/20:  Urinary leakage continues to  improve.  Will see [name] twice a year for 5 years, patient cancer free at this time. Update 09/06/20:  Wearing one pad a day for urinary leakage, continues to decrease. Update 10/07/20:  Continues to wear one pad a day, no change. Nurse Case Manager Clinical Goal(s):  patient will work with care management team to address care coordination and chronic disease management needs related to Disease Management Educational Needs Care Coordination Medication Management and Education Medication Reconciliation Medication Assistance  Psychosocial Support   Interventions:  Evaluation of current treatment plan and patient's adherence to plan as established by provider. Provided education to patient re: post surgical pain. Update 05/03/20:  Patient states he is no longer having any post surgical pain-feels fine. Reviewed medications with patient.  Discussed OTC pain medications.  Patient states he has stopped Wellbutrin, but is restarting. Update 05/03/20:  Patient states he has restarted Wellbutrin.-still smoking Update 06/02/20:  Patient continues to smoke 1/2 ppd and is taking Wellbutrin. Update 07/15/20:  No change-smoking approximately 1/2 ppd, takes Wellbutrin occasionally. Update 08/10/20:  smokes 4 cigarettes -1/2 ppd.  States Wellbutrin does not seem as effective as before.  States he will contact PCP about patch Update 09/06/20:  Smoking 1/2 ppd, has not called about patches. Update 10/07/20:  Patient still smoking about 1/2 ppd, patient tried to send message through [place] to his provider unsuccessfully, to request patch.  RNCM will send message to provider. [name] with Pharmacy  regarding medications. Discussed plans with patient for ongoing care management follow up and provided patient with direct contact information for care management team Reviewed scheduled/upcoming provider appointments.  Encouraged patient to follow up with Dr. Lynne Logan office for com[plaints of urinary leakage and  to find out when next appointment is. Update 07/15/20:  Next appt is 07/27/20. Update 08/10/20:  Patient seen and evaluated by [name]. Pharmacy referral for medication review Collaborated with PCP for nicotine patch to stop smoking. Self Care Activities:  Patient will self administer medications as prescribed Patient will attend all scheduled provider appointments Patient will call pharmacy for medication refills Patient will call provider office for new concerns or questions Patient Goals: Over the next 30 days, patient will restart Wellbutrin Update 05/03/20:  Patient has restarted Wellbutrin. Over the next 30 days, patient will attend all provider appointments. Over the next 30 days, patient will start nicotine patch. Over the next 30 days, patient will speak with Pharmacist regarding medications. Update 05/03/20:  Patient met with Pharmacist 04/27/20 and continues to follow. Update 09/06/20:  Patient very happy with Pharmacy services provided by [facility] and medication delivery. - Follow Up Plan: The care management team will reach out to the patient again over the next 30 days.    Evidence-based guidance:   Review biopsychosocial determinants of health screens.  Review need for preventive screening based on age, sex, family history and health history.  Determine level of modifiable health risk.  Discuss identified risks.  Identify areas where behavior change may lead to improved health.  Promote healthy lifestyle.  Evoke change talk using open-ended questions, pros and cons, as well as looking  forward.  Identify and manage conditions or preconditions to reduce health risk.  Implement additional goals and interventions based on identified risk factors.       Follow Up:  Patient agrees to Care Plan and Follow-up.  Plan: The Managed Medicaid care management team will reach out to the patient again over the next 30 days. and The patient has been provided with contact information for  the Managed Medicaid care management team and has been advised to call with any health related questions or concerns.  Date/time of next scheduled RN care management/care coordination outreach:  11/06/20 at 1030.

## 2020-10-07 NOTE — Patient Instructions (Signed)
Hi Mr. Frizell, thank you for speaking with me today, have a great day.  Mr. Hemric was given information about Medicaid Managed Care team care coordination services as a part of their [place] Medicaid benefit. [name] verbally consented to engagement with the [facility] Managed Care team.   If you are experiencing a medical emergency, please call 911 or report to your local emergency department or urgent care.   If you have a non-emergency medical problem during routine business hours, please contact your provider's office and ask to speak with a nurse.   For questions related to your [facility], please call: [phone] or visit the homepage here: [email or web address]  If you would like to schedule transportation through your [facility], please call the following number at least 2 days in advance of your appointment: [phone].   Call the [place] at [phone], at any time, 24 hours a day, 7 days a week. If you are in danger or need immediate medical attention call 911.  If you would like help to quit smoking, call 1-800-QUIT-NOW 463-644-4835) OR Espaol: 1-855-Djelo-Ya (6-948-546-2703) o para ms informacin haga clic aqu or Text READY to 200-400 to register via text  Mr. Square - following are the goals we discussed in your visit today:   Goals Addressed             This Visit's Progress    Disease Progression Prevented or Minimized       Update 05/03/20:  patient feeling well post surgery-no complaints. Update 06/02/20:  patient continues to do well since surgery-no complaints except for urine leakage-will follow up with [name] Update 07/15/20:  Patient states urinary leakage is less, only when coughing or sneezing. Has appt with [name] 07/27/20. Update 08/10/20:  Urinary leakage less-saw [name], will  follow up twice a year for 5 years. Update 09/06/20:  Using one pad a day for urinary leakage-getting less. Update 10/06/20:  No complaints today-would like to try patch for smoking-will send message to provider.  Evidence-based guidance:   Prepare patient for laboratory tests, such as lipid profile, microalbuminuria, estimated glomerular filtration rate and diagnostic exams based on risk and presentation.  Assess signs/symptoms and risk factors for hypertension, dyslipidemia, nephropathy, neuropathy and retinopathy.  Encourage lifestyle changes, such as increased intake of plant-based foods, stress reduction, regular physical activity and smoking cessation to prevent long-term complications and chronic diseases.  Discourage sedentary behavior or sitting longer than 30 minutes without interruption; individualize exercise or activity recommendations with potential limitations, such as neuropathy, retinopathy or the ability to prevent   hyperglycemia or hypoglycemia.  Prepare patient for use of pharmacologic therapy that may include antihypertensive, analgesic, statin (after age 49 years); anticipate periodic adjustments based on chronic condition and laboratory results Implement additional individualized goals and interventions based on identified risk factors.  Prepare patient for consultation or referral for specialist care, such as ophthalmology, neurology, cardiology, podiatry, nephrology or perinatology.          The patient verbalized understanding of instructions provided today and declined a print copy of patient instruction materials.   The Managed Medicaid care management team will reach out to the patient again over the next 30 days.  The  Patient  has been provided with contact information for the Managed Medicaid care management team and has been advised to call with any health related questions or concerns.   [name] RN, BSN [facility] Physiological scientist - Managed Medicaid High Risk [phone].    Following is a copy of your plan of care:  Patient Care Plan: General Plan of Care (Adult)     Problem Identified: Health Promotion or Disease Self-Management (General Plan of Care)   Priority: Medium  Onset Date: 04/25/2020     Long-Range Goal: Self-Management Plan Developed   Start Date: 04/25/2020  Expected End Date: 01/06/2021  Recent Progress: On track  Priority: Medium  Note:    Current Barriers:   Knowledge Deficits related to short term plan for care coordination needs and long term plans for chronic disease management needs.  Patient with recent diagnosis of prostate cancer s/p prostatectomy 04/04/20.  Patient complains of urinary leakage-will call urologist, [name].  Would like to stop smoking-has tried Wellbutrin-would like to try patch. Update 07/15/20:  Patient states leakage has improved, only with coughing, sneezing.  Has appt with [name] 07/27/20. Update 08/10/20:  Urinary leakage continues to improve.  Will see [name] twice a year for 5 years, patient cancer free at this time. Update 09/06/20:  Wearing one pad a day for urinary leakage, continues to decrease. Update 10/07/20:  Continues to wear one pad a day, no change. Nurse Case Manager Clinical Goal(s):  patient will work with care management team to address care coordination and chronic disease management needs related to Disease Management Educational Needs Care Coordination Medication Management and Education Medication Reconciliation Medication Assistance  Psychosocial Support   Interventions:  Evaluation of current treatment plan and patient's adherence to plan as established by provider. Provided education to patient re: post surgical pain. Update 05/03/20:  Patient states he is no longer having any post surgical pain-feels fine. Reviewed medications with patient.  Discussed OTC pain medications.  Patient states he has stopped  Wellbutrin, but is restarting. Update 05/03/20:  Patient states he has restarted Wellbutrin.-still smoking Update 06/02/20:  Patient continues to smoke 1/2 ppd and is taking Wellbutrin. Update 07/15/20:  No change-smoking approximately 1/2 ppd, takes Wellbutrin occasionally. Update 08/10/20:  smokes 4 cigarettes -1/2 ppd.  States Wellbutrin does not seem as effective as before.  States he will contact PCP about patch Update 09/06/20:  Smoking 1/2 ppd, has not called about patches. Update 10/07/20:  Patient still smoking about 1/2 ppd, patient tried to send message through [place] to his provider unsuccessfully, to request patch.  RNCM will send message to provider. [name] with Pharmacy  regarding medications. Discussed plans with patient for ongoing care management follow up and provided patient with direct contact information for care management team Reviewed scheduled/upcoming provider appointments.  Encouraged patient to follow up with Dr. Lynne Logan office for com[plaints of urinary leakage and to find out when next appointment is. Update 07/15/20:  Next appt is 07/27/20. Update 08/10/20:  Patient seen and evaluated by [name]. Pharmacy referral for medication review Collaborated with PCP for nicotine patch to stop smoking. Self Care Activities:  Patient will self administer medications as prescribed Patient will attend all scheduled provider appointments Patient will call pharmacy for medication refills Patient will call provider office for new concerns or questions Patient Goals: Over the next 30 days, patient will restart Wellbutrin Update 05/03/20:  Patient has restarted Wellbutrin. Over the next 30 days, patient will attend all provider appointments. Over the next 30 days, patient will start nicotine patch. Over the next 30 days, patient will speak with Pharmacist regarding medications. Update 05/03/20:  Patient met with Pharmacist 04/27/20  and continues to follow. Update 09/06/20:   Patient very happy with Pharmacy services provided by [facility] and medication delivery. - Follow Up Plan: The care management team will reach out to the patient again over the next 30 days.    Evidence-based guidance:   Review biopsychosocial determinants of health screens.  Review need for preventive screening based on age, sex, family history and health history.  Determine level of modifiable health risk.  Discuss identified risks.  Identify areas where behavior change may lead to improved health.  Promote healthy lifestyle.  Evoke change talk using open-ended questions, pros and cons, as well as looking forward.  Identify and manage conditions or preconditions to reduce health risk.  Implement additional goals and interventions based on identified risk factors.

## 2020-10-17 ENCOUNTER — Telehealth (INDEPENDENT_AMBULATORY_CARE_PROVIDER_SITE_OTHER): Payer: Self-pay

## 2020-10-17 NOTE — Telephone Encounter (Signed)
Copied from [place] [phone]. Topic: Quick Communication - Rx Refill/Question >> Oct 17, 2020 11:35 AM Pawlus, Brayton Layman A wrote: Nurse case manager from [facility] previously sent a request for the Pt to be prescribed nicotine patches, please advise if this has been done. Please call back.

## 2020-10-19 ENCOUNTER — Other Ambulatory Visit (INDEPENDENT_AMBULATORY_CARE_PROVIDER_SITE_OTHER): Payer: Self-pay | Admitting: Primary Care

## 2020-10-19 DIAGNOSIS — F17200 Nicotine dependence, unspecified, uncomplicated: Secondary | ICD-10-CM

## 2020-10-19 MED ORDER — NICOTINE 14 MG/24HR TD PT24: 14.0000 mg | MEDICATED_PATCH | Freq: Every day | TRANSDERMAL | 0 refills | Status: DC

## 2020-10-19 MED ORDER — NICOTINE 21 MG/24HR TD PT24: 21.0000 mg | MEDICATED_PATCH | Freq: Every day | TRANSDERMAL | 0 refills | Status: DC

## 2020-10-19 MED ORDER — NICOTINE 7 MG/24HR TD PT24: 7.0000 mg | MEDICATED_PATCH | Freq: Every day | TRANSDERMAL | 0 refills | Status: DC

## 2020-11-04 ENCOUNTER — Other Ambulatory Visit: Payer: Self-pay | Admitting: Obstetrics and Gynecology

## 2020-11-04 ENCOUNTER — Other Ambulatory Visit: Payer: Self-pay

## 2020-11-04 NOTE — Patient Outreach (Signed)
Medicaid Managed Care   Nurse Care Manager Note  11/04/2020 Name:  Wayne [name] MRN:  [id number] DOB:  Oct 06, 1960  Wayne [name] is an 60 y.o. year old male who is a primary patient of [name], NP.  The [facility] Managed Care Coordination team was consulted for assistance with:    Chronic healthcare management needs.  Mr. Cordoba was given information about Medicaid Managed Care Coordination team services today. Wayne [name] Patient agreed to services and verbal consent obtained.  Engaged with patient by telephone for follow up visit in response to provider referral for case management and/or care coordination services.   Assessments/Interventions:  Review of past medical history, allergies, medications, health status, including review of consultants reports, laboratory and other test data, was performed as part of comprehensive evaluation and provision of chronic care management services.  SDOH (Social Determinants of Health) assessments and interventions performed: SDOH Interventions    Flowsheet Row Most Recent Value  SDOH Interventions   Intimate Partner Violence Interventions Intervention Not Indicated  Transportation Interventions Intervention Not Indicated       Care Plan  Allergies  Allergen Reactions   Hydralazine Other (See Comments)    Headache    Medications Reviewed Today     Reviewed by Gayla Medicus, RN (Registered Nurse) on 11/04/20 at 1027  Med List Status: <None>   Medication Order Taking? Sig Documenting Provider Last Dose Status Informant  allopurinol (ZYLOPRIM) 300 MG tablet [id number] No TAKE 1 TABLET(300 MG) BY MOUTH DAILY [name], NP Taking Active   amLODipine (NORVASC) 10 MG tablet [id number]  Take 1 tablet (10 mg total) by mouth daily. [name], NP  Active   atorvastatin (LIPITOR) 40 MG tablet [id number] No Take 1 tablet (40 mg total) by mouth daily. [name], NP Taking Active   bu[facility]  SR) 150 MG 12 hr tablet [id number] No Take 1 tablet (150 mg total) by mouth 2 (two) times daily.  Patient not taking: Reported on 10/07/2020   [name], NP Not Taking Active Self  hydrochlorothiazide (HYDRODIURIL) 25 MG tablet [id number] No Take 1 tablet (25 mg total) by mouth daily. [name], NP Taking Active   losartan (COZAAR) 100 MG tablet [id number]  TAKE 1 TABLET(100 MG) BY MOUTH DAILY [name], NP  Active   nicotine (NICODERM CQ) 14 mg/24hr patch [id number]  Place 1 patch (14 mg total) onto the skin daily. [name], NP  Active   nicotine (NICODERM CQ) 21 mg/24hr patch [id number]  Place 1 patch (21 mg total) onto the skin daily. [name], NP  Active   nicotine (NICODERM CQ) 7 mg/24hr patch [id number]  Place 1 patch (7 mg total) onto the skin daily. [name], NP  Active   tadalafil (CIALIS) 10 MG tablet [id number] No Take 1 tablet (10 mg total) by mouth every other day as needed for erectile dysfunction. [name], NP Taking Active   Med List Note [name], NP 06/27/20 1021):                Patient Active Problem List   Diagnosis Date Noted   Prostate cancer ([place]) 04/04/2020   Malignant neoplasm of prostate ([place]) 01/17/2020   Acute gout due to renal impairment involving hand 08/12/2017   Essential hypertension 08/12/2017   Unilateral primary osteoarthritis, left knee 05/25/2016   Unilateral primary osteoarthritis, right knee 05/25/2016    Conditions to be addressed/monitored per  PCP order:   chronic healthcare management needs, osteoarthritis, gout, h/o prostate Ca, HTN.  Care Plan : General Plan of Care (Adult)  Updates made by Gayla Medicus, RN since 11/04/2020 12:00 AM     Problem: Health Promotion or Disease Self-Management (General Plan of Care)   Priority: Medium  Onset Date: 04/25/2020     Long-Range Goal: Self-Management Plan Developed   Start Date: 04/25/2020  Expected End Date:  01/06/2021  Recent Progress: On track  Priority: Medium  Note:    Current Barriers:   Knowledge Deficits related to short term plan for care coordination needs and long term plans for chronic disease management needs.  Patient with recent diagnosis of prostate cancer s/p prostatectomy 04/04/20.  Patient complains of urinary leakage-will call urologist, [name].  Would like to stop smoking-has tried Wellbutrin-would like to try patch. Update 07/15/20:  Patient states leakage has improved, only with coughing, sneezing.  Has appt with [name] 07/27/20. Update 08/10/20:  Urinary leakage continues to improve.  Will see [name] twice a year for 5 years, patient cancer free at this time. Update 09/06/20:  Wearing one pad a day for urinary leakage, continues to decrease. Update 10/07/20:  Continues to wear one pad a day, no change. Update 11/04/20:  No change-patient now using Nicotine patch. Nurse Case Manager Clinical Goal(s):  patient will work with care management team to address care coordination and chronic disease management needs related to Disease Management Educational Needs Care Coordination Medication Management and Education Medication Reconciliation Medication Assistance  Psychosocial Support   Interventions:  Evaluation of current treatment plan and patient's adherence to plan as established by provider. Provided education to patient re: post surgical pain. Update 05/03/20:  Patient states he is no longer having any post surgical pain-feels fine. Reviewed medications with patient.  Discussed OTC pain medications.  Patient states he has stopped Wellbutrin, but is restarting. Update 05/03/20:  Patient states he has restarted Wellbutrin.-still smoking Update 06/02/20:  Patient continues to smoke 1/2 ppd and is taking Wellbutrin. Update 07/15/20:  No change-smoking approximately 1/2 ppd, takes Wellbutrin occasionally. Update 08/10/20:  smokes 4 cigarettes -1/2 ppd.  States Wellbutrin does  not seem as effective as before.  States he will contact PCP about patch Update 09/06/20:  Smoking 1/2 ppd, has not called about patches. Update 10/07/20:  Patient still smoking about 1/2 ppd, patient tried to send message through [place] to his provider unsuccessfully, to request patch.  RNCM will send message to provider. Update 11/04/20:  Patient using patch-smoking 6 cigarettes a day. [name] with Pharmacy  regarding medications. Discussed plans with patient for ongoing care management follow up and provided patient with direct contact information for care management team Reviewed scheduled/upcoming provider appointments.  Encouraged patient to follow up with Dr. Lynne Logan office for com[plaints of urinary leakage and to find out when next appointment is. Update 07/15/20:  Next appt is 07/27/20. Update 08/10/20:  Patient seen and evaluated by [name], will see twice a year for 5 years for follow up Pharmacy referral for medication review-Pharmacist following Collaborated with PCP for nicotine patch to stop smoking-completed. Self Care Activities:  Patient will self administer medications as prescribed Patient will attend all scheduled provider appointments Patient will call pharmacy for medication refills Patient will call provider office for new concerns or questions Patient Goals: Over the next 30 days, patient will restart Wellbutrin Update 05/03/20:  Patient has restarted Wellbutrin. Over the next 30 days, patient will attend all provider appointments.  Over the next 30 days, patient will start nicotine patch.-completed Over the next 30 days, patient will speak with Pharmacist regarding medications. Update 05/03/20:  Patient met with Pharmacist 04/27/20 and continues to follow. Update 09/06/20:  Patient very happy with Pharmacy services provided by [facility] and medication delivery. - Follow Up Plan: The care management team will reach out to the patient again over the next 30 days.     Evidence-based guidance:   Review biopsychosocial determinants of health screens.  Review need for preventive screening based on age, sex, family history and health history.  Determine level of modifiable health risk.  Discuss identified risks.  Identify areas where behavior change may lead to improved health.  Promote healthy lifestyle.  Evoke change talk using open-ended questions, pros and cons, as well as looking forward.  Identify and manage conditions or preconditions to reduce health risk.  Implement additional goals and interventions based on identified risk factors.       Follow Up:  Patient agrees to Care Plan and Follow-up.  Plan: The Managed Medicaid care management team will reach out to the patient again over the next 30 days. and The  Patient has been provided with contact information for the Managed Medicaid care management team and has been advised to call with any health related questions or concerns.  Date/time of next scheduled RN care management/care coordination outreach: 10/05/20 at 0900.

## 2020-11-04 NOTE — Patient Instructions (Signed)
Hi Wayne [name], thank you for speaking with me today-have a great day!!  Wayne [name] was given information about Medicaid Managed Care team care coordination services as a part of their [place] Medicaid benefit. Wayne [name] verbally consented to engagement with the [facility] Managed Care team.   If you are experiencing a medical emergency, please call 911 or report to your local emergency department or urgent care.   If you have a non-emergency medical problem during routine business hours, please contact your provider's office and ask to speak with a nurse.   For questions related to your [facility], please call: [phone] or visit the homepage here: [email or web address]  If you would like to schedule transportation through your [facility], please call the following number at least 2 days in advance of your appointment: [phone].   Call the [facility] at [phone], at any time, 24 hours a day, 7 days a week. If you are in danger or need immediate medical attention call 911.  If you would like help to quit smoking, call 1-800-QUIT-NOW (574)358-7550) OR Espaol: 1-855-Djelo-Ya (8-546-270-3500) o para ms informacin haga clic aqu or Text READY to 200-400 to register via text  Wayne [name] - following are the goals we discussed in your visit today:   Goals Addressed             This Visit's Progress    Disease Progression Prevented or Minimized       Update 05/03/20:  patient feeling well post surgery-no complaints. Update 06/02/20:  patient continues to do well since surgery-no complaints except for urine leakage-will follow up with Dr. Gwen [name] Update 07/15/20:  Patient states urinary leakage is less, only when coughing or sneezing. Has appt with Dr. Alinda [name] 07/27/20. Update 08/10/20:  Urinary leakage less-saw Dr. Alinda [name], will  follow up twice a year for 5 years. Update 09/06/20:  Using one pad a day for urinary leakage-getting less. Update 10/06/20:  No complaints today-would like to try patch for smoking-will send message to provider. Update 11/04/20:  Patient using patch-smoking 6 cigarettes a day, PCP appt 12/09/20  Evidence-based guidance:   Prepare patient for laboratory tests, such as lipid profile, microalbuminuria, estimated glomerular filtration rate and diagnostic exams based on risk and presentation.  Assess signs/symptoms and risk factors for hypertension, dyslipidemia, nephropathy, neuropathy and retinopathy.  Encourage lifestyle changes, such as increased intake of plant-based foods, stress reduction, regular physical activity and smoking cessation to prevent long-term complications and chronic diseases.  Discourage sedentary behavior or sitting longer than 30 minutes without interruption; individualize exercise or activity recommendations with potential limitations, such as neuropathy, retinopathy or the ability to prevent   hyperglycemia or hypoglycemia.  Prepare patient for use of pharmacologic therapy that may include antihypertensive, analgesic, statin (after age 65 years); anticipate periodic adjustments based on chronic condition and laboratory results Implement additional individualized goals and interventions based on identified risk factors.  Prepare patient for consultation or referral for specialist care, such as ophthalmology, neurology, cardiology, podiatry, nephrology or perinatology.           The patient verbalized understanding of instructions provided today and declined a print copy of patient instruction materials.   The Managed Medicaid care management team will reach out to the patient again over the next 30 days.  The  Patient has been provided with contact information for the Managed Medicaid care management team and has been advised to call with any  health related questions or  concerns.   Wayne Raider RN, BSN [place]  [facility] - Managed Medicaid High Risk [phone].   Following is a copy of your plan of care:  Patient Care Plan: General Plan of Care (Adult)     Problem Identified: Health Promotion or Disease Self-Management (General Plan of Care)   Priority: Medium  Onset Date: 04/25/2020     Long-Range Goal: Self-Management Plan Developed   Start Date: 04/25/2020  Expected End Date: 01/06/2021  Recent Progress: On track  Priority: Medium  Note:    Current Barriers:   Knowledge Deficits related to short term plan for care coordination needs and long term plans for chronic disease management needs.  Patient with recent diagnosis of prostate cancer s/p prostatectomy 04/04/20.  Patient complains of urinary leakage-will call urologist, Dr. Alinda [name].  Would like to stop smoking-has tried Wellbutrin-would like to try patch. Update 07/15/20:  Patient states leakage has improved, only with coughing, sneezing.  Has appt with Dr. Alinda [name] 07/27/20. Update 08/10/20:  Urinary leakage continues to improve.  Will see Dr. Alinda [name] twice a year for 5 years, patient cancer free at this time. Update 09/06/20:  Wearing one pad a day for urinary leakage, continues to decrease. Update 10/07/20:  Continues to wear one pad a day, no change. Update 11/04/20:  No change-patient now using Nicotine patch. Nurse Case Manager Clinical Goal(s):  patient will work with care management team to address care coordination and chronic disease management needs related to Disease Management Educational Needs Care Coordination Medication Management and Education Medication Reconciliation Medication Assistance  Psychosocial Support   Interventions:  Evaluation of current treatment plan and patient's adherence to plan as established by provider. Provided education to patient re: post surgical pain. Update 05/03/20:  Patient states he is no longer having  any post surgical pain-feels fine. Reviewed medications with patient.  Discussed OTC pain medications.  Patient states he has stopped Wellbutrin, but is restarting. Update 05/03/20:  Patient states he has restarted Wellbutrin.-still smoking Update 06/02/20:  Patient continues to smoke 1/2 ppd and is taking Wellbutrin. Update 07/15/20:  No change-smoking approximately 1/2 ppd, takes Wellbutrin occasionally. Update 08/10/20:  smokes 4 cigarettes -1/2 ppd.  States Wellbutrin does not seem as effective as before.  States he will contact PCP about patch Update 09/06/20:  Smoking 1/2 ppd, has not called about patches. Update 10/07/20:  Patient still smoking about 1/2 ppd, patient tried to send message through [place] to his provider unsuccessfully, to request patch.  RNCM will send message to provider. Update 11/04/20:  Patient using patch-smoking 6 cigarettes a day. [name] with Pharmacy  regarding medications. Discussed plans with patient for ongoing care management follow up and provided patient with direct contact information for care management team Reviewed scheduled/upcoming provider appointments.  Encouraged patient to follow up with Dr. Lynne Logan office for com[plaints of urinary leakage and to find out when next appointment is. Update 07/15/20:  Next appt is 07/27/20. Update 08/10/20:  Patient seen and evaluated by Dr. Alinda [name], will see twice a year for 5 years for follow up Pharmacy referral for medication review-Pharmacist following Collaborated with PCP for nicotine patch to stop smoking-completed. Self Care Activities:  Patient will self administer medications as prescribed Patient will attend all scheduled provider appointments Patient will call pharmacy for medication refills Patient will call provider office for new concerns or questions Patient Goals: Over the next 30 days, patient will restart Wellbutrin Update 05/03/20:  Patient has restarted  Wellbutrin. Over the next 30 days, patient  will attend all provider appointments. Over the next 30 days, patient will start nicotine patch.-completed Over the next 30 days, patient will speak with Pharmacist regarding medications. Update 05/03/20:  Patient met with Pharmacist 04/27/20 and continues to follow. Update 09/06/20:  Patient very happy with Pharmacy services provided by [facility] and medication delivery. - Follow Up Plan: The care management team will reach out to the patient again over the next 30 days.    Evidence-based guidance:   Review biopsychosocial determinants of health screens.  Review need for preventive screening based on age, sex, family history and health history.  Determine level of modifiable health risk.  Discuss identified risks.  Identify areas where behavior change may lead to improved health.  Promote healthy lifestyle.  Evoke change talk using open-ended questions, pros and cons, as well as looking forward.  Identify and manage conditions or preconditions to reduce health risk.  Implement additional goals and interventions based on identified risk factors.

## 2020-11-10 ENCOUNTER — Other Ambulatory Visit (INDEPENDENT_AMBULATORY_CARE_PROVIDER_SITE_OTHER): Payer: Self-pay | Admitting: Primary Care

## 2020-11-10 DIAGNOSIS — Z8739 Personal history of other diseases of the musculoskeletal system and connective tissue: Secondary | ICD-10-CM

## 2020-11-10 NOTE — Telephone Encounter (Signed)
Sent to PCP ?

## 2020-12-05 ENCOUNTER — Other Ambulatory Visit: Payer: Self-pay

## 2020-12-05 ENCOUNTER — Other Ambulatory Visit: Payer: Self-pay | Admitting: Obstetrics and Gynecology

## 2020-12-05 NOTE — Patient Outreach (Signed)
Medicaid Managed Care   Nurse Care Manager Note  12/05/2020 Name:  Wayne [name] MRN:  [id number] DOB:  September 13, 1960  Wayne [name] is an 60 y.o. year old male who is a primary patient of [name], NP.  The [facility] Managed Care Coordination team was consulted for assistance with:    Chronic healthcare management needs.  Wayne [name] was given information about Medicaid Managed Care Coordination team services today. Wayne [name] Patient agreed to services and verbal consent obtained.  Engaged with patient by telephone for follow up visit in response to provider referral for case management and/or care coordination services.   Assessments/Interventions:  Review of past medical history, allergies, medications, health status, including review of consultants reports, laboratory and other test data, was performed as part of comprehensive evaluation and provision of chronic care management services.  SDOH (Social Determinants of Health) assessments and interventions performed: SDOH Interventions    Flowsheet Row Most Recent Value  SDOH Interventions   Housing Interventions Intervention Not Indicated  Physical Activity Interventions Other (Comments)  [discussed increased walking when able]       Care Plan  Allergies  Allergen Reactions   Hydralazine Other (See Comments)    Headache    Medications Reviewed Today     Reviewed by Gayla Medicus, RN (Registered Nurse) on 12/05/20 at [phone]  Med List Status: <None>   Medication Order Taking? Sig Documenting Provider Last Dose Status Informant  allopurinol (ZYLOPRIM) 300 MG tablet [id number]  TAKE ONE TABLET BY MOUTH ONCE DAILY [name], NP  Active   amLODipine (NORVASC) 10 MG tablet [id number]  Take 1 tablet (10 mg total) by mouth daily. [name], NP  Active   atorvastatin (LIPITOR) 40 MG tablet [id number] No Take 1 tablet (40 mg total) by mouth daily. [name], NP Taking Active   buPROPion  [facility] SR) 150 MG 12 hr tablet [id number] No Take 1 tablet (150 mg total) by mouth 2 (two) times daily.  Patient not taking: Reported on 10/07/2020   [name], NP Not Taking Active Self  hydrochlorothiazide (HYDRODIURIL) 25 MG tablet [id number] No Take 1 tablet (25 mg total) by mouth daily. [name], NP Taking Active   losartan (COZAAR) 100 MG tablet [id number]  TAKE 1 TABLET(100 MG) BY MOUTH DAILY [name], NP  Active   nicotine (NICODERM CQ) 14 mg/24hr patch [id number]  Place 1 patch (14 mg total) onto the skin daily. [name], NP  Active   nicotine (NICODERM CQ) 21 mg/24hr patch [id number]  Place 1 patch (21 mg total) onto the skin daily. [name], NP  Active   nicotine (NICODERM CQ) 7 mg/24hr patch [id number]  Place 1 patch (7 mg total) onto the skin daily. [name], NP  Active   tadalafil (CIALIS) 10 MG tablet [id number] No Take 1 tablet (10 mg total) by mouth every other day as needed for erectile dysfunction. [name], NP Taking Active   Med List Note [name], NP 06/27/20 1021):                Patient Active Problem List   Diagnosis Date Noted   Prostate cancer ([place]) 04/04/2020   Malignant neoplasm of prostate ([place]) 01/17/2020   Acute gout due to renal impairment involving hand 08/12/2017   Essential hypertension 08/12/2017   Unilateral primary osteoarthritis, left knee 05/25/2016   Unilateral primary osteoarthritis, right knee 05/25/2016    Conditions  to be addressed/monitored per PCP order:   chronic healthcare management needs, osteoarthritis, h/o prostate cancer s/p prostatectomy, tobacco use, HTN, gout.  Care Plan : General Plan of Care (Adult)  Updates made by Gayla Medicus, RN since 12/05/2020 12:00 AM     Problem: Health Promotion or Disease Self-Management (General Plan of Care)   Priority: Medium  Onset Date: 04/25/2020     Long-Range Goal: Self-Management Plan Developed    Start Date: 04/25/2020  Expected End Date: 01/06/2021  Recent Progress: On track  Priority: Medium  Note:    Current Barriers:   Knowledge Deficits related to short term plan for care coordination needs and long term plans for chronic disease management needs.  Patient with recent diagnosis of prostate cancer s/p prostatectomy 04/04/20.  Would like to stop smoking-has tried Wellbutrin-would like to try patch. Update 07/15/20:  Patient states leakage has improved, only with coughing, sneezing.  Has appt with [name] 07/27/20. Update 08/10/20:  Urinary leakage continues to improve.  Will see [name] twice a year for 5 years, patient cancer free at this time. Update 09/06/20:  Wearing one pad a day for urinary leakage, continues to decrease. Update 10/07/20:  Continues to wear one pad a day, no change. Update 11/04/20:  No change-patient now using Nicotine patch. Update 12/05/20:  No complaints, smoking 8 cigarettes a day/patch.  PCP 12/09/20. Nurse Case Manager Clinical Goal(s):  patient will work with care management team to address care coordination and chronic disease management needs related to Disease Management Educational Needs Care Coordination Medication Management and Education Medication Reconciliation Medication Assistance  Psychosocial Support   Interventions:  Evaluation of current treatment plan and patient's adherence to plan as established by provider. Provided education to patient re: post surgical pain. Update 05/03/20:  Patient states he is no longer having any post surgical pain-feels fine. Reviewed medications with patient.  Discussed OTC pain medications.  Patient states he has stopped Wellbutrin, but is restarting. Update 05/03/20:  Patient states he has restarted Wellbutrin.-still smoking Update 06/02/20:  Patient continues to smoke 1/2 ppd and is taking Wellbutrin. Update 07/15/20:  No change-smoking approximately 1/2 ppd, takes Wellbutrin occasionally. Update  08/10/20:  smokes 4 cigarettes -1/2 ppd.  States Wellbutrin does not seem as effective as before.  States he will contact PCP about patch Update 09/06/20:  Smoking 1/2 ppd, has not called about patches. Update 10/07/20:  Patient still smoking about 1/2 ppd, patient tried to send message through [place] to his provider unsuccessfully, to request patch.  RNCM will send message to provider. Update 11/04/20:  Patient using patch-smoking 6 cigarettes a day. Update 12/05/20:  Smoking 8 cigarettes a day, using patch-to discuss with PCP 12/09/20-doesn't feel like patch is helping as much. [name] with Pharmacy  regarding medications. Discussed plans with patient for ongoing care management follow up and provided patient with direct contact information for care management team Reviewed scheduled/upcoming provider appointments.  Encouraged patient to follow up with Dr. Lynne Logan office for com[plaints of urinary leakage and to find out when next appointment is. Update 07/15/20:  Next appt is 07/27/20. Update 08/10/20:  Patient seen and evaluated by [name], will see twice a year for 5 years for follow up Pharmacy referral for medication review-Pharmacist following Collaborated with PCP for nicotine patch to stop smoking-completed. Self Care Activities:  Patient will self administer medications as prescribed Patient will attend all scheduled provider appointments Patient will call pharmacy for medication refills Patient will call provider office for new  concerns or questions Patient Goals: Over the next 30 days, patient will restart Wellbutrin Update 05/03/20:  Patient has restarted Wellbutrin. Over the next 30 days, patient will attend all provider appointments. Over the next 30 days, patient will start nicotine patch.-completed Over the next 30 days, patient will speak with Pharmacist regarding medications. Update 05/03/20:  Patient met with Pharmacist 04/27/20 and continues to follow. Update 09/06/20:   Patient very happy with Pharmacy services provided by [facility] and medication delivery. - Follow Up Plan: The care management team will reach out to the patient again over the next 30 days.    Evidence-based guidance:   Review biopsychosocial determinants of health screens.  Review need for preventive screening based on age, sex, family history and health history.  Determine level of modifiable health risk.  Discuss identified risks.  Identify areas where behavior change may lead to improved health.  Promote healthy lifestyle.  Evoke change talk using open-ended questions, pros and cons, as well as looking forward.  Identify and manage conditions or preconditions to reduce health risk.  Implement additional goals and interventions based on identified risk factors.     Follow Up:  Patient agrees to Care Plan and Follow-up.  Plan: The Managed Medicaid care management team will reach out to the patient again over the next 30 days. and The  Patient has been provided with contact information for the Managed Medicaid care management team and has been advised to call with any health related questions or concerns.  Date/time of next scheduled RN care management/care coordination outreach:  01/04/21 at 0900.

## 2020-12-05 NOTE — Patient Instructions (Signed)
Hi Wayne [name] you for speaking with me today-have a great day!!  Wayne [name] was given information about Medicaid Managed Care team care coordination services as a part of their [place] Medicaid benefit. Wayne [name] verbally consented to engagement with the [facility] Managed Care team.   If you are experiencing a medical emergency, please call 911 or report to your local emergency department or urgent care.   If you have a non-emergency medical problem during routine business hours, please contact your provider's office and ask to speak with a nurse.   For questions related to your [facility], please call: [phone] or visit the homepage here: [email or web address]  If you would like to schedule transportation through your [facility], please call the following number at least 2 days in advance of your appointment: [phone].   Call the [place] at [phone], at any time, 24 hours a day, 7 days a week. If you are in danger or need immediate medical attention call 911.  If you would like help to quit smoking, call 1-800-QUIT-NOW (541)812-2894) OR Espaol: 1-855-Djelo-Ya (0-211-155-2080) o para ms informacin haga clic aqu or Text READY to 200-400 to register via text  Wayne [name] - following are the goals we discussed in your visit today:   Goals Addressed             This Visit's Progress    Disease Progression Prevented or Minimized       Update 05/03/20:  patient feeling well post surgery-no complaints. Update 06/02/20:  patient continues to do well since surgery-no complaints except for urine leakage-will follow up with [name] Update 07/15/20:  Patient states urinary leakage is less, only when coughing or sneezing. Has appt with [name] 07/27/20. Update 08/10/20:  Urinary leakage less-saw [name], will  follow up twice a year for 5 years. Update 09/06/20:  Using one pad a day for urinary leakage-getting less. Update 10/06/20:  No complaints today-would like to try patch for smoking-will send message to provider. Update 11/04/20:  Patient using patch-smoking 6 cigarettes a day, PCP appt 12/09/20 Update 12/05/20:  Patient without complaints today-smoking 8 cigarettes a day, using patches  Evidence-based guidance:   Prepare patient for laboratory tests, such as lipid profile, microalbuminuria, estimated glomerular filtration rate and diagnostic exams based on risk and presentation.  Assess signs/symptoms and risk factors for hypertension, dyslipidemia, nephropathy, neuropathy and retinopathy.  Encourage lifestyle changes, such as increased intake of plant-based foods, stress reduction, regular physical activity and smoking cessation to prevent long-term complications and chronic diseases.  Discourage sedentary behavior or sitting longer than 30 minutes without interruption; individualize exercise or activity recommendations with potential limitations, such as neuropathy, retinopathy or the ability to prevent   hyperglycemia or hypoglycemia.  Prepare patient for use of pharmacologic therapy that may include antihypertensive, analgesic, statin (after age 48 years); anticipate periodic adjustments based on chronic condition and laboratory results Implement additional individualized goals and interventions based on identified risk factors.  Prepare patient for consultation or referral for specialist care, such as ophthalmology, neurology, cardiology, podiatry, nephrology or perinatology.     The patient verbalized understanding of instructions provided today and declined a print copy of patient instruction materials.   The Managed Medicaid care management team will reach out to the patient again over the next 30 days.  The  Patient  has been provided with contact information for the Managed Medicaid care  management team  and has been advised to call with any health related questions or concerns.   [name] RN, BSN [place]  [facility] - Managed Medicaid High Risk [phone].   Following is a copy of your plan of care:  Care Plan : General Plan of Care (Adult)  Updates made by Gayla Medicus, RN since 12/05/2020 12:00 AM     Problem: Health Promotion or Disease Self-Management (General Plan of Care)   Priority: Medium  Onset Date: 04/25/2020     Long-Range Goal: Self-Management Plan Developed   Start Date: 04/25/2020  Expected End Date: 01/06/2021  Recent Progress: On track  Priority: Medium  Note:    Current Barriers:   Knowledge Deficits related to short term plan for care coordination needs and long term plans for chronic disease management needs.  Patient with recent diagnosis of prostate cancer s/p prostatectomy 04/04/20.  Would like to stop smoking-has tried Wellbutrin-would like to try patch. Update 07/15/20:  Patient states leakage has improved, only with coughing, sneezing.  Has appt with [name] 07/27/20. Update 08/10/20:  Urinary leakage continues to improve.  Will see [name] twice a year for 5 years, patient cancer free at this time. Update 09/06/20:  Wearing one pad a day for urinary leakage, continues to decrease. Update 10/07/20:  Continues to wear one pad a day, no change. Update 11/04/20:  No change-patient now using Nicotine patch. Update 12/05/20:  No complaints, smoking 8 cigarettes a day/patch.  PCP 12/09/20. Nurse Case Manager Clinical Goal(s):  patient will work with care management team to address care coordination and chronic disease management needs related to Disease Management Educational Needs Care Coordination Medication Management and Education Medication Reconciliation Medication Assistance  Psychosocial Support   Interventions:  Evaluation of current treatment plan and patient's adherence to plan as  established by provider. Provided education to patient re: post surgical pain. Update 05/03/20:  Patient states he is no longer having any post surgical pain-feels fine. Reviewed medications with patient.  Discussed OTC pain medications.  Patient states he has stopped Wellbutrin, but is restarting. Update 05/03/20:  Patient states he has restarted Wellbutrin.-still smoking Update 06/02/20:  Patient continues to smoke 1/2 ppd and is taking Wellbutrin. Update 07/15/20:  No change-smoking approximately 1/2 ppd, takes Wellbutrin occasionally. Update 08/10/20:  smokes 4 cigarettes -1/2 ppd.  States Wellbutrin does not seem as effective as before.  States he will contact PCP about patch Update 09/06/20:  Smoking 1/2 ppd, has not called about patches. Update 10/07/20:  Patient still smoking about 1/2 ppd, patient tried to send message through [place] to his provider unsuccessfully, to request patch.  RNCM will send message to provider. Update 11/04/20:  Patient using patch-smoking 6 cigarettes a day. Update 12/05/20:  Smoking 8 cigarettes a day, using patch-to discuss with PCP 12/09/20-doesn't feel like patch is helping as much. [name] with Pharmacy  regarding medications. Discussed plans with patient for ongoing care management follow up and provided patient with direct contact information for care management team Reviewed scheduled/upcoming provider appointments.  Encouraged patient to follow up with Dr. Lynne Logan office for com[plaints of urinary leakage and to find out when next appointment is. Update 07/15/20:  Next appt is 07/27/20. Update 08/10/20:  Patient seen and evaluated by [name], will see twice a year for 5 years for follow up Pharmacy referral for medication review-Pharmacist following Collaborated with PCP for nicotine patch to stop smoking-completed. Self Care Activities:  Patient will self administer medications as  prescribed Patient will attend all scheduled provider  appointments Patient will call pharmacy for medication refills Patient will call provider office for new concerns or questions Patient Goals: Over the next 30 days, patient will restart Wellbutrin Update 05/03/20:  Patient has restarted Wellbutrin. Over the next 30 days, patient will attend all provider appointments. Over the next 30 days, patient will start nicotine patch.-completed Over the next 30 days, patient will speak with Pharmacist regarding medications. Update 05/03/20:  Patient met with Pharmacist 04/27/20 and continues to follow. Update 09/06/20:  Patient very happy with Pharmacy services provided by [facility] and medication delivery. - Follow Up Plan: The care management team will reach out to the patient again over the next 30 days.    Evidence-based guidance:   Review biopsychosocial determinants of health screens.  Review need for preventive screening based on age, sex, family history and health history.  Determine level of modifiable health risk.  Discuss identified risks.  Identify areas where behavior change may lead to improved health.  Promote healthy lifestyle.  Evoke change talk using open-ended questions, pros and cons, as well as looking forward.  Identify and manage conditions or preconditions to reduce health risk.  Implement additional goals and interventions based on identified risk factors.

## 2020-12-09 ENCOUNTER — Encounter (INDEPENDENT_AMBULATORY_CARE_PROVIDER_SITE_OTHER): Payer: Self-pay | Admitting: Primary Care

## 2020-12-09 ENCOUNTER — Other Ambulatory Visit: Payer: Self-pay

## 2020-12-09 ENCOUNTER — Ambulatory Visit (INDEPENDENT_AMBULATORY_CARE_PROVIDER_SITE_OTHER): Payer: Medicaid Other | Admitting: Primary Care

## 2020-12-09 VITALS — BP 144/89 | HR 76 | Temp 97.5°F | Ht 75.0 in | Wt 247.2 lb

## 2020-12-09 DIAGNOSIS — Z716 Tobacco abuse counseling: Secondary | ICD-10-CM | POA: Diagnosis not present

## 2020-12-09 DIAGNOSIS — I10 Essential (primary) hypertension: Secondary | ICD-10-CM | POA: Diagnosis not present

## 2020-12-09 NOTE — Patient Instructions (Signed)
Tobacco Use Disorder Tobacco use disorder (TUD) occurs when a person craves, seeks, and uses tobacco, regardless of the consequences. This disorder can cause problems with mental and physical health. It can affect your ability to have healthy relationships, and it can keep you from meeting your responsibilities at work, home, or school. Tobacco may be: Smoked as a cigarette or cigar. Inhaled using e-cigarettes. Smoked in a pipe or hookah. Chewed as smokeless tobacco. Inhaled into the nostrils as snuff. Tobacco products contain a dangerous chemical called nicotine, which is very addictive. Nicotine triggers hormones that make the body feel stimulated and works on areas of the brain that make you feel good. These effects can make it hard for people to quit nicotine. Tobacco contains many other unsafe chemicals that can damage almost every organ in the body. Smoking tobacco also puts others in danger due to fire risk and possible health problems caused by breathing in secondhand smoke. What are the signs or symptoms? Symptoms of TUD may include: Being unable to slow down or stop your tobacco use. Spending an abnormal amount of time getting or using tobacco. Craving tobacco. Cravings may last for up to 6 months after quitting. Tobacco use that: Interferes with your work, school, or home life. Interferes with your personal and social relationships. Makes you give up activities that you once enjoyed or found important. Using tobacco even though you know that it is: Dangerous or bad for your health or someone else's health. Causing problems in your life. Needing more and more of the substance to get the same effect (developing tolerance). Experiencing unpleasant symptoms if you do not use the substance (withdrawal). Withdrawal symptoms may include: Depressed, anxious, or irritable mood. Difficulty concentrating. Increased appetite. Restlessness or trouble sleeping. Using the substance to avoid  withdrawal. How is this diagnosed? This condition may be diagnosed based on: Your current and past tobacco use. Your health care provider may ask questions about how your tobacco use affects your life. A physical exam. You may be diagnosed with TUD if you have at least two symptoms within a 29-month period. How is this treated? This condition is treated by stopping tobacco use. Many people are unable to quit on their own and need help. Treatment may include: Nicotine replacement therapy (NRT). NRT provides nicotine without the other harmful chemicals in tobacco. NRT gradually lowers the dosage of nicotine in the body and reduces withdrawal symptoms. NRT is available as: Over-the-counter gums, lozenges, and skin patches. Prescription mouth inhalers and nasal sprays. Medicine that acts on the brain to reduce cravings and withdrawal symptoms. A type of talk therapy that examines your triggers for tobacco use, how to avoid them, and how to cope with cravings (behavioral therapy). Hypnosis. This may help with withdrawal symptoms. Joining a support group for others coping with TUD. The best treatment for TUD is usually a combination of medicine, talk therapy, and support groups. Recovery can be a long process. Many people start using tobacco again after stopping (relapse). If you relapse, it does not mean that treatment will not work. Follow these instructions at home: Lifestyle Do not use any products that contain nicotine or tobacco, such as cigarettes and e-cigarettes. Avoid things that trigger tobacco use as much as you can. Triggers include people and situations that usually cause you to use tobacco. Avoid drinks that contain caffeine, including coffee. These may worsen some withdrawal symptoms. Find ways to manage stress. Wanting to smoke may cause stress, and stress can make you want to  smoke. Relaxation techniques such as deep breathing, meditation, and yoga may help. Attend support groups as  needed. These groups are an important part of long-term recovery for many people. General instructions Take over-the-counter and prescription medicines only as told by your health care provider. Check with your health care provider before taking any new prescription or over-the-counter medicines. Decide on a friend, family member, or smoking quit-line (such as 1-800-QUIT-NOW in the [place] that you can call or text when you feel the urge to smoke or when you need help coping with cravings. Keep all follow-up visits as told by your health care provider and therapist. This is important. Contact a health care provider if: You are not able to take your medicines as prescribed. Your symptoms get worse, even with treatment. Summary Tobacco use disorder (TUD) occurs when a person craves, seeks, and uses tobacco regardless of the consequences. This condition may be diagnosed based on your current and past tobacco use and a physical exam. Many people are unable to quit on their own and need help. Recovery can be a long process. The most effective treatment for TUD is usually a combination of medicine, talk therapy, and support groups. This information is not intended to replace advice given to you by your health care provider. Make sure you discuss any questions you have with your health care provider. Document Revised: 09/16/2020 Document Reviewed: 12/01/2019 Elsevier Patient Education  2022 [employer].

## 2020-12-09 NOTE — Progress Notes (Signed)
[facility]   Wayne [name] is a 60 y.o. male presents for hypertension evaluation, Denies shortness of breath, headaches, chest pain or lower extremity edema, sudden onset, vision changes, unilateral weakness, dizziness, paresthesias  Patient blood pressure remains elevated despite reading current reminders do not smoke before your appointment.  When I reminded him he remembered.  Tried explain when blood pressure elevated and you telling me you are taking your medication the first instinct is to add next appt although encouraging tobacco sensation.  However her next appointment he will smoke after the appointment not before. Patient reports adherence with medications. Denies shortness of breath, headaches, chest pain or lower extremity edema   Dietary habits include: Monitoring a low-sodium diet Exercise habits include: Walks Family / Social history: No   Past Medical History:  Diagnosis Date   Arthritis    shoulders    Gout    High cholesterol    Hypertension    Prostate cancer [facility])    Past Surgical History:  Procedure Laterality Date   KNEE ARTHROSCOPY     LUMBAR DISC SURGERY     LYMPHADENECTOMY Bilateral 04/04/2020   Procedure: LYMPHADENECTOMY, PELVIC;  Surgeon: Raynelle Bring, MD;  Location: [facility] ORS;  Service: Urology;  Laterality: Bilateral;   ROBOT ASSISTED LAPAROSCOPIC RADICAL PROSTATECTOMY N/A 04/04/2020   Procedure: XI ROBOTIC ASSISTED LAPAROSCOPIC RADICAL PROSTATECTOMY LEVEL 2;  Surgeon: Raynelle Bring, MD;  Location: [facility] OR[facility];  Service: Urology;  Laterality: N/A;   Allergies  Allergen Reactions   Hydralazine Other (See Comments)    Headache   Current Outpatient Medications on File Prior to Visit  Medication Sig Dispense Refill   allopurinol (ZYLOPRIM) 300 MG tablet TAKE ONE TABLET BY MOUTH ONCE DAILY 30 tablet 0   amLODipine (NORVASC) 10 MG tablet Take 1 tablet (10 mg total) by mouth daily. 90 tablet 1   atorvastatin (LIPITOR) 40 MG tablet Take 1  tablet (40 mg total) by mouth daily. 90 tablet 3   hydrochlorothiazide (HYDRODIURIL) 25 MG tablet Take 1 tablet (25 mg total) by mouth daily. 90 tablet 3   losartan (COZAAR) 100 MG tablet TAKE 1 TABLET(100 MG) BY MOUTH DAILY 90 tablet 1   nicotine (NICODERM CQ) 14 mg/24hr patch Place 1 patch (14 mg total) onto the skin daily. 28 patch 0   tadalafil (CIALIS) 10 MG tablet Take 1 tablet (10 mg total) by mouth every other day as needed for erectile dysfunction. 10 tablet 1   buPROPion (WELLBUTRIN SR) 150 MG 12 hr tablet Take 1 tablet (150 mg total) by mouth 2 (two) times daily. 180 tablet 1   nicotine (NICODERM CQ) 21 mg/24hr patch Place 1 patch (21 mg total) onto the skin daily. 28 patch 0   nicotine (NICODERM CQ) 7 mg/24hr patch Place 1 patch (7 mg total) onto the skin daily. (Patient not taking: Reported on 12/09/2020) 28 patch 0   No current facility-administered medications on file prior to visit.   Social History   Socioeconomic History   Marital status: Married    Spouse name: Mazon   Number of children: 4   Years of education: Not on file   Highest education level: Not on file  Occupational History    Comment: patient does not work full time  Tobacco Use   Smoking status: Every Day    Packs/day: 0.50    Types: Cigarettes   Smokeless tobacco: Never   Tobacco comments:    Started smoking at the age of 66  Vaping Use  Vaping Use: Never used  Substance and Sexual Activity   Alcohol use: Yes    Alcohol/week: 14.0 standard drinks    Types: 14 Cans of beer per week   Drug use: No   Sexual activity: Yes  Other Topics Concern   Not on file  Social History Narrative   Not on file   Social Determinants of Health   Financial Resource Strain: Low Risk    Difficulty of Paying Living Expenses: Not hard at all  Food Insecurity: No Food Insecurity   Worried About Charity fundraiser in the Last Year: Never true   [place] in the Last Year: Never true  Transportation Needs:  No Transportation Needs   Lack of Transportation (Medical): No   Lack of Transportation (Non-Medical): No  Physical Activity: Insufficiently Active   Days of Exercise per Week: 2 days   Minutes of Exercise per Session: 10 min  Stress: No Stress Concern Present   Feeling of Stress : Not at all  Social Connections: Moderately Integrated   Frequency of Communication with Friends and Family: More than three times a week   Frequency of Social Gatherings with Friends and Family: More than three times a week   Attends Religious Services: 1 to 4 times per year   Active Member of [employer] or Organizations: No   Attends Archivist Meetings: Never   Marital Status: Married  Human resources officer Violence: Not At Risk   Fear of Current or Ex-Partner: No   Emotionally Abused: No   Physically Abused: No   Sexually Abused: No   Family History  Problem Relation Age of Onset   Colon cancer Mother    Pancreatic cancer Maternal Uncle    Colon polyps Neg Hx    Esophageal cancer Neg Hx    Rectal cancer Neg Hx      OBJECTIVE:  Vitals:   12/09/20 1115  BP: (!) 144/89  Pulse: 76  Temp: (!) 97.5 F (36.4 C)  TempSrc: Temporal  SpO2: 97%  Weight: 247 lb 3.2 oz (112.1 kg)  Height: 6\' 3"  (1.905 m)  Amlodipine 10 mg and hydrochlorothiazide 25 mg daily Physical exam: General: Vital signs reviewed.  Patient is well-developed and well-nourished, obese male in no acute distress and cooperative with exam. Head: Normocephalic and atraumatic. Eyes: EOMI, conjunctivae normal, no scleral icterus. Neck: Supple, trachea midline, normal ROM, no JVD, masses, thyromegaly, or carotid bruit present. Cardiovascular: RRR, S1 normal, S2 normal, no murmurs, gallops, or rubs. Pulmonary/Chest: Clear to auscultation bilaterally, no wheezes, rales, or rhonchi. Abdominal: Soft, non-tender, non-distended, BS +, no masses, organomegaly, or guarding present. Musculoskeletal: No joint deformities, erythema, or  stiffness, ROM full and nontender. Extremities: No lower extremity edema bilaterally,  pulses symmetric and intact bilaterally. No cyanosis or clubbing. Neurological: A&O x3, Strength is normal Skin: Warm, dry and intact. No rashes or erythema. Psychiatric: Normal mood and affect. speech and behavior is normal. Cognition and memory are normal.     ROS Comprehensive review of systems pertinent negative and positives noted in HPI Last 3 Office BP readings: BP Readings from Last 3 Encounters:  12/09/20 (!) 144/89  06/09/20 133/81  05/18/20 119/80    BMET    Component Value Date/Time   NA 141 03/30/2020 1041   NA 141 06/10/2019 1045   K 3.6 03/30/2020 1041   CL 106 03/30/2020 1041   CO2 21 (L) 03/30/2020 1041   GLUCOSE 105 (H) 03/30/2020 1041   BUN 12 03/30/2020  1041   BUN 14 06/10/2019 1045   CREATININE 0.85 03/30/2020 1041   CALCIUM 9.4 03/30/2020 1041   GFRNONAA >60 03/30/2020 1041   GFRAA 82 06/10/2019 1045    Renal function: CrCl cannot be calculated (Patient's most recent lab result is older than the maximum 21 days allowed.).  Clinical ASCVD: Yes  The 10-year ASCVD risk score (Arnett DK, et al., 2019) is: 26.8%   Values used to calculate the score:     Age: 44 years     Sex: Male     Is Non-Hispanic African American: Yes     Diabetic: No     Tobacco smoker: Yes     Systolic Blood Pressure: 798 mmHg     Is BP treated: Yes     HDL Cholesterol: 37 mg/dL     Total Cholesterol: 146 mg/dL  ASCVD risk factors include- [name]   ASSESSMENT & PLAN: Miklos was seen today for hypertension.  Diagnoses and all orders for this visit:  Essential hypertension -Counseled on lifestyle modifications for blood pressure control including reduced dietary sodium, increased exercise, weight reduction and adequate sleep. Also, educated patient about the risk for cardiovascular events, stroke and heart attack. Also counseled patient about the importance of medication adherence. If you  participate in smoking, it is important to stop using tobacco as this will increase the risks associated with uncontrolled blood pressure.   -Hypertension longstanding diagnosed currently amlodipine 10 mg and hydrochlorothiazide 25 mg daily on current medications. Patient is adherent with current medications.  Discussed gout and hydrochlorothiazide previous attack was related to food as patient to monitor attacks/frequency and if there  is another causative factor  Goal BP:  For patients younger than 60: Goal BP < 130/80. For patients 60 and older: Goal BP < 140/90. For patients with diabetes: Goal BP < 130/80. Your most recent BP: 144/89  Minimize salt intake. Minimize alcohol intake  Tobacco abuse counseling - I have recommended complete cessation of tobacco use. I have discussed various options available for assistance with tobacco cessation including over the counter methods (Nicotine gum, patch and lozenges). We also discussed prescription options (Chantix, Nicotine Inhaler / Nasal Spray). The patient is not interested in pursuing any prescription tobacco cessation options at this time. - Patient declines at this time.    This note has been created with Surveyor, quantity. Any transcriptional errors are unintentional.   [name], NP 12/09/2020, 11:39 AM

## 2021-01-03 ENCOUNTER — Other Ambulatory Visit: Payer: Self-pay

## 2021-01-03 NOTE — Patient Outreach (Signed)
Medicaid Managed Care Pharmacy Note  01/03/2021 Name:  Wayne [name] MRN:  [id number] DOB:  09-18-1960  Wayne [name] is an 60 y.o. year old male who is a primary patient of [name], NP.  The [facility] Managed Care Coordination team was consulted for assistance with disease management and care coordination needs.    Engaged with patient by telephone for follow up visit in response to referral for case management and/or care coordination services.  Wayne [name] was given information about Medicaid Managed Care Coordination team services today. Wayne [name] Patient agreed to services and verbal consent obtained.  Objective:  Lab Results  Component Value Date   CREATININE 0.85 03/30/2020   CREATININE 1.13 06/10/2019   CREATININE 1.08 05/22/2019    No results found for: HGBA1C     Component Value Date/Time   CHOL 146 05/18/2020 0959   TRIG 154 (H) 05/18/2020 0959   HDL 37 (L) 05/18/2020 0959   CHOLHDL 3.9 05/18/2020 0959   LDLCALC 82 05/18/2020 0959     BP Readings from Last 3 Encounters:  12/09/20 (!) 144/89  06/09/20 133/81  05/18/20 119/80     Care Plan  Allergies  Allergen Reactions   Hydralazine Other (See Comments)    Headache    Medications Reviewed Today     Reviewed by Hughes Better, RPH-CPP (Pharmacist) on 01/03/21 at 0909  Med List Status: <None>   Medication Order Taking? Sig Documenting Provider Last Dose Status Informant  allopurinol (ZYLOPRIM) 300 MG tablet [id number] Yes TAKE ONE TABLET BY MOUTH ONCE DAILY [name], NP Taking Active   amLODipine (NORVASC) 10 MG tablet [id number] Yes Take 1 tablet (10 mg total) by mouth daily. [name], NP Taking Active   atorvastatin (LIPITOR) 40 MG tablet [id number] Yes Take 1 tablet (40 mg total) by mouth daily. [name], NP Taking Active   hydrochlorothiazide (HYDRODIURIL) 25 MG tablet [id number] Yes Take 1 tablet (25 mg total) by mouth daily. [name],  NP Taking Active   losartan (COZAAR) 100 MG tablet [id number] Yes TAKE 1 TABLET(100 MG) BY MOUTH DAILY [name], NP Taking Active   nicotine (NICODERM CQ) 14 mg/24hr patch [id number] Yes Place 1 patch (14 mg total) onto the skin daily. [name], NP Taking Active   nicotine (NICODERM CQ) 7 mg/24hr patch [id number] No Place 1 patch (7 mg total) onto the skin daily.  Patient not taking: Reported on 12/09/2020   [name], NP Not Taking Active   tadalafil (CIALIS) 10 MG tablet [id number] Yes Take 1 tablet (10 mg total) by mouth every other day as needed for erectile dysfunction. [name], NP Taking Active   Med List Note [name], NP 06/27/20 1021):                Patient Active Problem List   Diagnosis Date Noted   Prostate cancer ([place]) 04/04/2020   Malignant neoplasm of prostate ([place]) 01/17/2020   Acute gout due to renal impairment involving hand 08/12/2017   Essential hypertension 08/12/2017   Unilateral primary osteoarthritis, left knee 05/25/2016   Unilateral primary osteoarthritis, right knee 05/25/2016    Conditions to be addressed/monitored per PCP order:  HTN, Gout, and Tobacco Use    Care Plan : General Pharmacy (Adult)  Updates made by Hughes Better, RPH-CPP since 01/03/2021 12:00 AM     Problem: Chronic Disease Management   Priority: High     Long-Range Goal: Patient Stated  Start Date: 01/03/2021  Expected End Date: 04/03/2021  This Visit's Progress: On track  Priority: High  Note:   Current Barriers:  Unable to achieve control of smoking cessation  Unable to maintain control of gout and blood pressure`   Pharmacist Clinical Goal(s):  patient will maintain control of HTN and gout as evidenced by vital signs, home blood pressure readings, and decrease in gout attacks  achieve improvement in smoking as evidenced by decrease in cigarette use  through collaboration with PharmD and provider.     Interventions: Inter-disciplinary care team collaboration (see longitudinal plan of care) Comprehensive medication review performed; medication list updated in electronic medical record  Hypertension:  Uncontrolled; current treatment: amlodipine 10mg , hydrochlorthiazide 25mg , losartan 100mg   Current home readings: 139/89 last checked on 12/01/20  Denies hypotensive/hypertensive symptoms  Counseled on importance of checking blood pressure more regularly (2-3x/week) as blood pressure has been elevated  Tobacco Abuse:  1/2 packs per day; 44 years of use; does not smoke within 30 minutes of waking up  Previous quit attempts: unsuccessful; has taken bupropion in the past and states this did help him  Motivation to quit smoking: "I just want to quit"  On a scale of 1-10, how IMPORTANT is it for you to quit smoking: 9  On a scale of 1-10, how CONFIDENT are you that you can quit smoking: 7  Recommended monthly follow-up to assess progress towards tobacco cessation       Collaborated with PCP to discuss buprioprion use alongside NRT  Gout      Uncontrolled; current treatment: allopurinol 300mg       Patient reports he has gout attacks weekly      He states he has not been recording them alongside trigger as he is supposed to per last discussion with PCP      Recommended patient record events per discussion with PCP in order to identify cause  Patient Goals/Self-Care Activities patient will:  - check blood pressure 2-3x/week, document, and provide at future appointments - work towards decreasing cigarette use and continue to apply patches as appropriate  Follow Up Plan:  Telephone follow up appointment with care management team member scheduled for: 01/04/21 Care Manager; 02/03/21 Pharmacist Next PCP appointment scheduled for: 03/13/21      Hughes Better PharmD, CPP High Risk Managed [facility] [phone]

## 2021-01-03 NOTE — Patient Instructions (Signed)
Visit Information  Wayne [name] was given information about Medicaid Managed Care team care coordination services as a part of their [place] Medicaid benefit. Wayne [name] verbally consented to engagement with the [facility] Managed Care team.   If you are experiencing a medical emergency, please call 911 or report to your local emergency department or urgent care.   If you have a non-emergency medical problem during routine business hours, please contact your provider's office and ask to speak with a nurse.   For questions related to your [facility], please call: [phone] or visit the homepage here: [email or web address]  If you would like to schedule transportation through your [facility], please call the following number at least 2 days in advance of your appointment: [phone].   Call the [place] at [phone], at any time, 24 hours a day, 7 days a week. If you are in danger or need immediate medical attention call 911.  If you would like help to quit smoking, call 1-800-QUIT-NOW (680)148-2147) OR Espaol: 1-855-Djelo-Ya (4-970-263-7858) o para ms informacin haga clic aqu or Text READY to 200-400 to register via text  Wayne [name] - following are the goals we discussed in your visit today:   Goals Addressed   None     The patient verbalized understanding of instructions provided today and declined a print copy of patient instruction materials.   RN Care Manager will call on 01/04/21 Pharmacist will call on 02/03/21 Next PCP appointment:  03/13/21  Wayne [name] PharmD, CPP High Risk Managed Medicaid [place] [phone]   Following is a copy of your plan of care:   Care Plan : General Pharmacy (Adult)  Updates made by Wayne [name], RPH-CPP since 01/03/2021 12:00 AM     Problem: Chronic Disease Management    Priority: High     Long-Range Goal: Patient Stated   Start Date: 01/03/2021  Expected End Date: 04/03/2021  This Visit's Progress: On track  Priority: High  Note:   Current Barriers:  Unable to achieve control of smoking cessation  Unable to maintain control of gout and blood pressure`   Pharmacist Clinical Goal(s):  patient will maintain control of HTN and gout as evidenced by vital signs, home blood pressure readings, and decrease in gout attacks  achieve improvement in smoking as evidenced by decrease in cigarette use  through collaboration with PharmD and provider.    Interventions: Inter-disciplinary care team collaboration (see longitudinal plan of care) Comprehensive medication review performed; medication list updated in electronic medical record  Hypertension:  Uncontrolled; current treatment: amlodipine 10mg , hydrochlorthiazide 25mg , losartan 100mg   Current home readings: 139/89 last checked on 12/01/20  Denies hypotensive/hypertensive symptoms  Counseled on importance of checking blood pressure more regularly (2-3x/week) as blood pressure has been elevated  Tobacco Abuse:  1/2 packs per day; 44 years of use; does not smoke within 30 minutes of waking up  Previous quit attempts: unsuccessful; has taken bupropion in the past and states this did help him  Motivation to quit smoking: "I just want to quit"  On a scale of 1-10, how IMPORTANT is it for you to quit smoking: 9  On a scale of 1-10, how CONFIDENT are you that you can quit smoking: 7  Recommended monthly follow-up to assess progress towards tobacco cessation       Collaborated with PCP to discuss buprioprion use alongside NRT  Gout      Uncontrolled; current treatment:  allopurinol 300mg       Patient reports he has gout attacks weekly      He states he has not been recording them alongside trigger as he is supposed to per last discussion with PCP      Recommended patient record events per discussion with PCP  in order to identify cause  Patient Goals/Self-Care Activities patient will:  - check blood pressure 2-3x/week, document, and provide at future appointments - work towards decreasing cigarette use and continue to apply patches as appropriate  Follow Up Plan:  Telephone follow up appointment with care management team member scheduled for: 01/04/21 Care Manager; 02/03/21 Pharmacist Next PCP appointment scheduled for: 03/13/21

## 2021-01-04 ENCOUNTER — Other Ambulatory Visit: Payer: Self-pay | Admitting: Obstetrics and Gynecology

## 2021-01-04 ENCOUNTER — Other Ambulatory Visit: Payer: Self-pay

## 2021-01-04 NOTE — Patient Instructions (Signed)
Hi Wayne [name] you for speaking with me today-have a wonderful day!!  Wayne [name] was given information about Medicaid Managed Care team care coordination services as a part of their [place] Medicaid benefit. Wayne [name] verbally consented to engagement with the [facility] Managed Care team.   If you are experiencing a medical emergency, please call 911 or report to your local emergency department or urgent care.   If you have a non-emergency medical problem during routine business hours, please contact your provider's office and ask to speak with a nurse.   For questions related to your [facility], please call: [phone] or visit the homepage here: [email or web address]  If you would like to schedule transportation through your [facility], please call the following number at least 2 days in advance of your appointment: [phone].   Call the [place] at [phone], at any time, 24 hours a day, 7 days a week. If you are in danger or need immediate medical attention call 911.  If you would like help to quit smoking, call 1-800-QUIT-NOW (949) 619-1228) OR Espaol: 1-855-Djelo-Ya (6-144-315-4008) o para ms informacin haga clic aqu or Text READY to 200-400 to register via text  Wayne [name] - following are the goals we discussed in your visit today:   Goals Addressed             This Visit's Progress    Disease Progression Prevented or Minimized       01/04/21:  patient without complaint today-smoking about 8 cigarettes a day, using patches.  Evidence-based guidance:   Prepare patient for laboratory tests, such as lipid profile, microalbuminuria, estimated glomerular filtration rate and diagnostic exams based on risk and presentation.  Assess signs/symptoms and risk factors for hypertension, dyslipidemia, nephropathy, neuropathy  and retinopathy.  Encourage lifestyle changes, such as increased intake of plant-based foods, stress reduction, regular physical activity and smoking cessation to prevent long-term complications and chronic diseases.  Discourage sedentary behavior or sitting longer than 30 minutes without interruption; individualize exercise or activity recommendations with potential limitations, such as neuropathy, retinopathy or the ability to prevent   hyperglycemia or hypoglycemia.  Prepare patient for use of pharmacologic therapy that may include antihypertensive, analgesic, statin (after age 56 years); anticipate periodic adjustments based on chronic condition and laboratory results Implement additional individualized goals and interventions based on identified risk factors.  Prepare patient for consultation or referral for specialist care, such as ophthalmology, neurology, cardiology, podiatry, nephrology or perinatology.     The patient verbalized understanding of instructions provided today and declined a print copy of patient instruction materials.   The Managed Medicaid care management team will reach out to the patient again over the next 30 days.  The  Patient  has been provided with contact information for the Managed Medicaid care management team and has been advised to call with any health related questions or concerns.   [name] RN, BSN [place] Management Coordinator - Managed Medicaid High Risk [phone]    Following is a copy of your plan of care:  Care Plan : General Plan of Care (Adult)  Updates made by Gayla Medicus, RN since 01/04/2021 12:00 AM     Problem: Health Promotion or Disease Self-Management (General Plan of Care)   Priority: Medium  Onset Date: 04/25/2020     Long-Range Goal: Self-Management Plan Developed   Start Date: 04/25/2020  Expected End Date:  04/04/2021  Recent Progress: On track  Priority: Medium  Note:    Current  Barriers:   Knowledge Deficits related to short term plan for care coordination needs and long term plans for chronic disease management needs.  01/04/21:  Patient continues to smoke 8 cigarettes a day, using patch.  Has follow up appointment with [name] in February. Nurse Case Manager Clinical Goal(s):  patient will work with care management team to address care coordination and chronic disease management needs related to Disease Management Educational Needs Care Coordination Medication Management and Education Medication Reconciliation Medication Assistance  Psychosocial Support   Interventions:  Evaluation of current treatment plan and patient's adherence to plan as established by provider. Provided education to patient re: post surgical pain. Reviewed medications with patient.   [name] with Pharmacy  regarding medications. Discussed plans with patient for ongoing care management follow up and provided patient with direct contact information for care management team Reviewed scheduled/upcoming provider appointments.  Pharmacy referral for medication review-Pharmacist following Collaborated with PCP for nicotine patch to stop smoking-completed. Self Care Activities:  Patient will self administer medications as prescribed Patient will attend all scheduled provider appointments Patient will call pharmacy for medication refills Patient will call provider office for new concerns or questions Patient Goals: Over the next 30 days, patient will attend all provider appointments. Over the next 30 days, patient will start nicotine patch.-completed Over the next 30 days, patient will speak with Pharmacist regarding medications. . Follow Up Plan: The care management team will reach out to the patient again over the next 30 days.    Evidence-based guidance:   Review biopsychosocial determinants of health screens.  Review need for preventive screening based on age, sex, family  history and health history.  Determine level of modifiable health risk.  Discuss identified risks.  Identify areas where behavior change may lead to improved health.  Promote healthy lifestyle.  Evoke change talk using open-ended questions, pros and cons, as well as looking forward.  Identify and manage conditions or preconditions to reduce health risk.  Implement additional goals and interventions based on identified risk factors.

## 2021-01-04 NOTE — Patient Outreach (Signed)
Medicaid Managed Care   Nurse Care Manager Note  01/04/2021 Name:  GALAN GHEE MRN:  [id number] DOB:  1960/07/25  Wayne [name] is an 60 y.o. year old male who is a primary patient of [name], NP.  The [facility] Managed Care Coordination team was consulted for assistance with:    Chronic healthcare management needs.  Mr. Wale was given information about Medicaid Managed Care Coordination team services today. Wayne [name] Patient agreed to services and verbal consent obtained.  Engaged with patient by telephone for follow up visit in response to provider referral for case management and/or care coordination services.   Assessments/Interventions:  Review of past medical history, allergies, medications, health status, including review of consultants reports, laboratory and other test data, was performed as part of comprehensive evaluation and provision of chronic care management services.  SDOH (Social Determinants of Health) assessments and interventions performed: SDOH Interventions    Flowsheet Row Most Recent Value  SDOH Interventions   Food Insecurity Interventions Intervention Not Indicated       Care Plan  Allergies  Allergen Reactions   Hydralazine Other (See Comments)    Headache    Medications Reviewed Today     Reviewed by Gayla Medicus, RN (Registered Nurse) on 01/04/21 at [phone]  Med List Status: <None>   Medication Order Taking? Sig Documenting Provider Last Dose Status Informant  allopurinol (ZYLOPRIM) 300 MG tablet [id number] No TAKE ONE TABLET BY MOUTH ONCE DAILY [name], NP Taking Active   amLODipine (NORVASC) 10 MG tablet [id number] No Take 1 tablet (10 mg total) by mouth daily. [name], NP Taking Active   atorvastatin (LIPITOR) 40 MG tablet [id number] No Take 1 tablet (40 mg total) by mouth daily. [name], NP Taking Active   hydrochlorothiazide (HYDRODIURIL) 25 MG tablet [id number] No Take 1 tablet (25 mg  total) by mouth daily. [name], NP Taking Active   losartan (COZAAR) 100 MG tablet [id number] No TAKE 1 TABLET(100 MG) BY MOUTH DAILY [name], NP Taking Active   nicotine (NICODERM CQ) 14 mg/24hr patch [id number] No Place 1 patch (14 mg total) onto the skin daily. [name], NP Taking Active   nicotine (NICODERM CQ) 7 mg/24hr patch [id number] No Place 1 patch (7 mg total) onto the skin daily.  Patient not taking: Reported on 12/09/2020   [name], NP Not Taking Active   tadalafil (CIALIS) 10 MG tablet [id number] No Take 1 tablet (10 mg total) by mouth every other day as needed for erectile dysfunction. [name], NP Taking Active   Med List Note [name], NP 06/27/20 1021):                Patient Active Problem List   Diagnosis Date Noted   Prostate cancer ([place]) 04/04/2020   Malignant neoplasm of prostate ([place]) 01/17/2020   Acute gout due to renal impairment involving hand 08/12/2017   Essential hypertension 08/12/2017   Unilateral primary osteoarthritis, left knee 05/25/2016   Unilateral primary osteoarthritis, right knee 05/25/2016    Conditions to be addressed/monitored per PCP order:   chronic healthcare management needs, osteoarthritis, HTN, tobacco use, gout, h/o prostate cancer.  Care Plan : General Plan of Care (Adult)  Updates made by Gayla Medicus, RN since 01/04/2021 12:00 AM     Problem: Health Promotion or Disease Self-Management (General Plan of Care)   Priority: Medium  Onset Date: 04/25/2020  Long-Range Goal: Self-Management Plan Developed   Start Date: 04/25/2020  Expected End Date: 04/04/2021  Recent Progress: On track  Priority: Medium  Note:    Current Barriers:   Knowledge Deficits related to short term plan for care coordination needs and long term plans for chronic disease management needs.  01/04/21:  Patient continues to smoke 8 cigarettes a day, using patch.  Has follow up  appointment with [name] in February. Nurse Case Manager Clinical Goal(s):  patient will work with care management team to address care coordination and chronic disease management needs related to Disease Management Educational Needs Care Coordination Medication Management and Education Medication Reconciliation Medication Assistance  Psychosocial Support   Interventions:  Evaluation of current treatment plan and patient's adherence to plan as established by provider. Provided education to patient re: post surgical pain. Reviewed medications with patient.   [name] with Pharmacy  regarding medications. Discussed plans with patient for ongoing care management follow up and provided patient with direct contact information for care management team Reviewed scheduled/upcoming provider appointments.  Pharmacy referral for medication review-Pharmacist following Collaborated with PCP for nicotine patch to stop smoking-completed. Self Care Activities:  Patient will self administer medications as prescribed Patient will attend all scheduled provider appointments Patient will call pharmacy for medication refills Patient will call provider office for new concerns or questions Patient Goals: Over the next 30 days, patient will attend all provider appointments. Over the next 30 days, patient will start nicotine patch.-completed Over the next 30 days, patient will speak with Pharmacist regarding medications. . Follow Up Plan: The care management team will reach out to the patient again over the next 30 days.    Evidence-based guidance:   Review biopsychosocial determinants of health screens.  Review need for preventive screening based on age, sex, family history and health history.  Determine level of modifiable health risk.  Discuss identified risks.  Identify areas where behavior change may lead to improved health.  Promote healthy lifestyle.  Evoke change talk using open-ended  questions, pros and cons, as well as looking forward.  Identify and manage conditions or preconditions to reduce health risk.  Implement additional goals and interventions based on identified risk factors.    Follow Up:  Patient agrees to Care Plan and Follow-up.  Plan: The Managed Medicaid care management team will reach out to the patient again over the next 30 days. and The  Patient has been provided with contact information for the Managed Medicaid care management team and has been advised to call with any health related questions or concerns.  Date/time of next scheduled RN care management/care coordination outreach:  02/01/21 at 0900.

## 2021-02-01 ENCOUNTER — Other Ambulatory Visit: Payer: Self-pay

## 2021-02-01 ENCOUNTER — Other Ambulatory Visit: Payer: Self-pay | Admitting: Obstetrics and Gynecology

## 2021-02-01 NOTE — Patient Instructions (Signed)
Hi Wayne [name], nice to talk with you this morning, have a wonderful day!  Wayne [name] was given information about Medicaid Managed Care team care coordination services as a part of their [place] Medicaid benefit. Wayne [name] verbally consented to engagement with the [facility] Managed Care team.   If you are experiencing a medical emergency, please call 911 or report to your local emergency department or urgent care.   If you have a non-emergency medical problem during routine business hours, please contact your provider's office and ask to speak with a nurse.   For questions related to your [facility], please call: [phone] or visit the homepage here: [email or web address]  If you would like to schedule transportation through your [facility], please call the following number at least 2 days in advance of your appointment: [phone].   Call the [place] at [phone], at any time, 24 hours a day, 7 days a week. If you are in danger or need immediate medical attention call 911.  If you would like help to quit smoking, call 1-800-QUIT-NOW 570-429-1716) OR Espaol: 1-855-Djelo-Ya (3-716-967-8938) o para ms informacin haga clic aqu or Text READY to 200-400 to register via text  Wayne [name] - following are the goals we discussed in your visit today:   Goals Addressed             This Visit's Progress    Disease Progression Prevented or Minimized       02/01/21:  Patient complaining of knee pain today-has appt with Dr. Erlinda Hong 02/03/21 and appt with PCP and urologist  in February.  Evidence-based guidance:   Prepare patient for laboratory tests, such as lipid profile, microalbuminuria, estimated glomerular filtration rate and diagnostic exams based on risk and presentation.  Assess signs/symptoms and risk factors for hypertension,  dyslipidemia, nephropathy, neuropathy and retinopathy.  Encourage lifestyle changes, such as increased intake of plant-based foods, stress reduction, regular physical activity and smoking cessation to prevent long-term complications and chronic diseases.  Discourage sedentary behavior or sitting longer than 30 minutes without interruption; individualize exercise or activity recommendations with potential limitations, such as neuropathy, retinopathy or the ability to prevent   hyperglycemia or hypoglycemia.  Prepare patient for use of pharmacologic therapy that may include antihypertensive, analgesic, statin (after age 69 years); anticipate periodic adjustments based on chronic condition and laboratory results Implement additional individualized goals and interventions based on identified risk factors.  Prepare patient for consultation or referral for specialist care, such as ophthalmology, neurology, cardiology, podiatry, nephrology or perinatology.      The patient verbalized understanding of instructions provided today and declined a print copy of patient instruction materials.   The Managed Medicaid care management team will reach out to the patient again over the next 30 days.  The  Patient  has been provided with contact information for the Managed Medicaid care management team and has been advised to call with any health related questions or concerns.   [name] RN, BSN [place]   [facility] - Managed Medicaid High Risk [phone].   Following is a copy of your plan of care:  Care Plan : General Plan of Care (Adult)  Updates made by Gayla Medicus, RN since 02/01/2021 12:00 AM     Problem: Health Promotion or Disease Self-Management (General Plan of Care)   Priority: Medium  Onset Date: 04/25/2020     Long-Range Goal: Self-Management  Plan Developed   Start Date: 04/25/2020  Expected End Date: 04/04/2021  Recent Progress: On track   Priority: Medium  Note:    Current Barriers:   Knowledge Deficits related to short term plan for care coordination needs and long term plans for chronic disease management needs.  02/01/21:  Patient complaining of knee pain today-has appt with Dr. Erlinda Hong 02/03/21-no other complaints today.  Patient not checking blood pressure regularly-last reading 01/14/21 133/94. Nurse Case Manager Clinical Goal(s):  patient will work with care management team to address care coordination and chronic disease management needs related to Disease Management Educational Needs Care Coordination Medication Management and Education Medication Reconciliation Medication Assistance  Psychosocial Support   Interventions:  Evaluation of current treatment plan and patient's adherence to plan as established by provider. Provided education to patient re: post surgical pain. Reviewed medications with patient.   [name] with Pharmacy  regarding medications. Discussed plans with patient for ongoing care management follow up and provided patient with direct contact information for care management team Reviewed scheduled/upcoming provider appointments.  02/01/21:  Patient has follow up appt with Dr. Erlinda Hong 02/03/21-appt with PCP and urologist in February. Pharmacy referral for medication review-Pharmacist following Collaborated with PCP for nicotine patch to stop smoking-completed. 02/01/21:  Patient smoking 8 cigarettes a day and using patch.  Has resources. Self Care Activities:  Patient will self administer medications as prescribed Patient will attend all scheduled provider appointments Patient will call pharmacy for medication refills Patient will call provider office for new concerns or questions Patient Goals: Over the next 30 days, patient will attend all provider appointments. Over the next 30 days, patient will start nicotine patch.-completed Over the next 30 days, patient will speak with Pharmacist regarding  medications. Over the next 30 days, patient will check blood pressure on a regular basis. . Follow Up Plan: The care management team will reach out to the patient again over the next 30 days.    Evidence-based guidance:   Review biopsychosocial determinants of health screens.  Review need for preventive screening based on age, sex, family history and health history.  Determine level of modifiable health risk.  Discuss identified risks.  Identify areas where behavior change may lead to improved health.  Promote healthy lifestyle.  Evoke change talk using open-ended questions, pros and cons, as well as looking forward.  Identify and manage conditions or preconditions to reduce health risk.  Implement additional goals and interventions based on identified risk factors.

## 2021-02-01 NOTE — Patient Outreach (Signed)
Medicaid Managed Care   Nurse Care Manager Note  02/01/2021 Name:  Wayne [name] MRN:  [id number] DOB:  1960/08/18  Wayne [name] is an 61 y.o. year old male who is a primary patient of [name], NP.  The [facility] Managed Care Coordination team was consulted for assistance with:    Chronic healthcare management needs  Wayne [name] was given information about Medicaid Managed Care Coordination team services today. Wayne [name] Patient agreed to services and verbal consent obtained.  Engaged with patient by telephone for follow up visit in response to provider referral for case management and/or care coordination services.   Assessments/Interventions:  Review of past medical history, allergies, medications, health status, including review of consultants reports, laboratory and other test data, was performed as part of comprehensive evaluation and provision of chronic care management services.  SDOH (Social Determinants of Health) assessments and interventions performed: SDOH Interventions    Flowsheet Row Most Recent Value  SDOH Interventions   Stress Interventions Intervention Not Indicated  Social Connections Interventions Intervention Not Indicated       Care Plan  Allergies  Allergen Reactions   Hydralazine Other (See Comments)    Headache    Medications Reviewed Today     Reviewed by Gayla Medicus, RN (Registered Nurse) on 02/01/21 at [phone]  Med List Status: <None>   Medication Order Taking? Sig Documenting Provider Last Dose Status Informant  allopurinol (ZYLOPRIM) 300 MG tablet [id number] No TAKE ONE TABLET BY MOUTH ONCE DAILY [name], NP Taking Active   amLODipine (NORVASC) 10 MG tablet [id number] No Take 1 tablet (10 mg total) by mouth daily. [name], NP Taking Active   atorvastatin (LIPITOR) 40 MG tablet [id number] No Take 1 tablet (40 mg total) by mouth daily. [name], NP Taking Active   hydrochlorothiazide  (HYDRODIURIL) 25 MG tablet [id number] No Take 1 tablet (25 mg total) by mouth daily. [name], NP Taking Active   losartan (COZAAR) 100 MG tablet [id number] No TAKE 1 TABLET(100 MG) BY MOUTH DAILY [name], NP Taking Active   nicotine (NICODERM CQ) 14 mg/24hr patch [id number] No Place 1 patch (14 mg total) onto the skin daily. [name], NP Taking Active   nicotine (NICODERM CQ) 7 mg/24hr patch [id number] No Place 1 patch (7 mg total) onto the skin daily.  Patient not taking: Reported on 12/09/2020   [name], NP Not Taking Active   tadalafil (CIALIS) 10 MG tablet [id number] No Take 1 tablet (10 mg total) by mouth every other day as needed for erectile dysfunction. [name], NP Taking Active   Med List Note [name], NP 06/27/20 1021):                Patient Active Problem List   Diagnosis Date Noted   Prostate cancer ([place]) 04/04/2020   Malignant neoplasm of prostate ([place]) 01/17/2020   Acute gout due to renal impairment involving hand 08/12/2017   Essential hypertension 08/12/2017   Unilateral primary osteoarthritis, left knee 05/25/2016   Unilateral primary osteoarthritis, right knee 05/25/2016    Conditions to be addressed/monitored per PCP order:   chronic healthcare management needs, tobacco use, gout, HTN, osteoarthritis, h/o prostate cancer  Care Plan : General Plan of Care (Adult)  Updates made by Gayla Medicus, RN since 02/01/2021 12:00 AM     Problem: Health Promotion or Disease Self-Management (General Plan of Care)   Priority: Medium  Onset  Date: 04/25/2020     Long-Range Goal: Self-Management Plan Developed   Start Date: 04/25/2020  Expected End Date: 04/04/2021  Recent Progress: On track  Priority: Medium  Note:    Current Barriers:   Knowledge Deficits related to short term plan for care coordination needs and long term plans for chronic disease management needs.  02/01/21:  Patient complaining of  knee pain today-has appt with Dr. Erlinda Hong 02/03/21-no other complaints today.  Patient not checking blood pressure regularly-last reading 01/14/21 133/94. Nurse Case Manager Clinical Goal(s):  patient will work with care management team to address care coordination and chronic disease management needs related to Disease Management Educational Needs Care Coordination Medication Management and Education Medication Reconciliation Medication Assistance  Psychosocial Support   Interventions:  Evaluation of current treatment plan and patient's adherence to plan as established by provider. Provided education to patient re: post surgical pain. Reviewed medications with patient.   Wayne [name] with Pharmacy  regarding medications. Discussed plans with patient for ongoing care management follow up and provided patient with direct contact information for care management team Reviewed scheduled/upcoming provider appointments.  02/01/21:  Patient has follow up appt with Dr. Erlinda Hong 02/03/21-appt with PCP and urologist in February. Pharmacy referral for medication review-Pharmacist following Collaborated with PCP for nicotine patch to stop smoking-completed. 02/01/21:  Patient smoking 8 cigarettes a day and using patch.  Has resources. Self Care Activities:  Patient will self administer medications as prescribed Patient will attend all scheduled provider appointments Patient will call pharmacy for medication refills Patient will call provider office for new concerns or questions Patient Goals: Over the next 30 days, patient will attend all provider appointments. Over the next 30 days, patient will start nicotine patch.-completed Over the next 30 days, patient will speak with Pharmacist regarding medications. Over the next 30 days, patient will check blood pressure on a regular basis. . Follow Up Plan: The care management team will reach out to the patient again over the next 30 days.    Evidence-based  guidance:   Review biopsychosocial determinants of health screens.  Review need for preventive screening based on age, sex, family history and health history.  Determine level of modifiable health risk.  Discuss identified risks.  Identify areas where behavior change may lead to improved health.  Promote healthy lifestyle.  Evoke change talk using open-ended questions, pros and cons, as well as looking forward.  Identify and manage conditions or preconditions to reduce health risk.  Implement additional goals and interventions based on identified risk factors.     Follow Up:  Patient agrees to Care Plan and Follow-up.  Plan: The Managed Medicaid care management team will reach out to the patient again over the next 30 days. and The  Patient has been provided with contact information for the Managed Medicaid care management team and has been advised to call with any health related questions or concerns.  Date/time of next scheduled RN care management/care coordination outreach: 03/01/21 at 0900.

## 2021-02-03 ENCOUNTER — Ambulatory Visit: Payer: Medicaid Other | Admitting: Orthopaedic Surgery

## 2021-02-03 ENCOUNTER — Ambulatory Visit: Payer: Self-pay

## 2021-02-03 ENCOUNTER — Encounter: Payer: Self-pay | Admitting: Orthopaedic Surgery

## 2021-02-03 ENCOUNTER — Other Ambulatory Visit (INDEPENDENT_AMBULATORY_CARE_PROVIDER_SITE_OTHER): Payer: Self-pay | Admitting: Primary Care

## 2021-02-03 ENCOUNTER — Other Ambulatory Visit: Payer: Self-pay

## 2021-02-03 ENCOUNTER — Telehealth: Payer: Self-pay | Admitting: Pharmacist

## 2021-02-03 ENCOUNTER — Ambulatory Visit (INDEPENDENT_AMBULATORY_CARE_PROVIDER_SITE_OTHER): Payer: Medicaid Other

## 2021-02-03 DIAGNOSIS — M25562 Pain in left knee: Secondary | ICD-10-CM | POA: Diagnosis not present

## 2021-02-03 DIAGNOSIS — Z760 Encounter for issue of repeat prescription: Secondary | ICD-10-CM

## 2021-02-03 DIAGNOSIS — M25561 Pain in right knee: Secondary | ICD-10-CM

## 2021-02-03 DIAGNOSIS — G8929 Other chronic pain: Secondary | ICD-10-CM

## 2021-02-03 MED ORDER — BUPIVACAINE HCL 0.25 % IJ SOLN
0.6600 mL | INTRAMUSCULAR | Status: AC | PRN
  Administered 2021-02-03: .66 mL via INTRA_ARTICULAR

## 2021-02-03 MED ORDER — LIDOCAINE HCL 1 % IJ SOLN
3.0000 mL | INTRAMUSCULAR | Status: AC | PRN
Start: 2021-02-03 — End: 2021-02-03
  Administered 2021-02-03: 3 mL

## 2021-02-03 MED ORDER — LIDOCAINE HCL 1 % IJ SOLN
3.0000 mL | INTRAMUSCULAR | Status: AC | PRN
  Administered 2021-02-03: 3 mL

## 2021-02-03 MED ORDER — METHYLPREDNISOLONE ACETATE 40 MG/ML IJ SUSP
13.3300 mg | INTRAMUSCULAR | Status: AC | PRN
  Administered 2021-02-03: 13.33 mg via INTRA_ARTICULAR

## 2021-02-03 NOTE — Telephone Encounter (Signed)
Requested Prescriptions  Pending Prescriptions Disp Refills   amLODipine (NORVASC) 10 MG tablet [Pharmacy Med Name: amlodipine 10 mg tablet] 90 tablet 1    Sig: TAKE ONE TABLET BY MOUTH ONCE DAILY     Cardiovascular:  Calcium Channel Blockers Failed - 02/03/2021  8:03 AM      Failed - Last BP in normal range    BP Readings from Last 1 Encounters:  12/09/20 (!) 144/89         Passed - Valid encounter within last 6 months    Recent Outpatient Visits          1 month ago Essential hypertension   [place] [name], NP   8 months ago Erectile disorder due to medical condition in male   [place], [place], NP   8 months ago Mixed hyperlipidemia   [place], [name] P, NP   9 months ago Hospital discharge follow-up   [place], [place], NP   1 year ago Tobacco use disorder   [place] [name], NP      Future Appointments            Today Leandrew Koyanagi, MD [facility]   In 1 month [name], NP [place]

## 2021-02-03 NOTE — Patient Outreach (Signed)
02/03/2021 Name: Wayne [name] MRN: [id number] DOB: December 20, 1960  Referred by: [name], NP Reason for referral : No chief complaint on file.   An unsuccessful telephone outreach was attempted today. The patient was referred to the case management team for assistance with care management and care coordination.    Follow Up Plan: A HIPAA compliant phone message was left for the patient providing contact information and requesting a return call. and The Managed Medicaid care management team will reach out to the patient again over the next 14 days.   Hughes Better PharmD, CPP High Risk Managed Medicaid [place] [phone]

## 2021-02-03 NOTE — Progress Notes (Signed)
Office Visit Note   Patient: Wayne [name]           Date of Birth: Oct 28, 1960           MRN: [id number] Visit Date: 02/03/2021              Requested by: [name], NP [address] [place],  [place] [address] PCP: [name], NP   Assessment & Plan: Visit Diagnoses:  1. Chronic pain of left knee   2. Chronic pain of right knee     Plan: Impression is bilateral knee advanced degenerative joint disease.  Today, we discussed repeat cortisone injections due to longevity of relief from previous injections versus viscosupplementation injections versus total knee arthroplasty.  He would like to proceed with cortisone today.  He will follow-up with [place] as needed.  Follow-Up Instructions: Return if symptoms worsen or fail to improve.   Orders:  Orders Placed This Encounter  Procedures   Large Joint Inj: bilateral knee   XR KNEE 3 VIEW LEFT   XR KNEE 3 VIEW RIGHT   No orders of the defined types were placed in this encounter.     Procedures: Large Joint Inj: bilateral knee on 02/03/2021 10:39 AM Indications: pain Details: 22 G needle, anterolateral approach Medications (Right): 0.66 mL bupivacaine 0.25 %; 3 mL lidocaine 1 %; 13.33 mg methylPREDNISolone acetate 40 MG/ML Medications (Left): 0.66 mL bupivacaine 0.25 %; 3 mL lidocaine 1 %; 13.33 mg methylPREDNISolone acetate 40 MG/ML     Clinical Data: No additional findings.   Subjective: Chief Complaint  Patient presents with   Left Knee - Pain   Right Knee - Pain    HPI patient is a pleasant 61 year old gentleman who comes in today with recurrent bilateral knee pain left greater than right.  History of advanced degenerative joint disease to both knees.  He was seen in our office in early February of last year were both knees were injected with cortisone.  He had great relief in symptoms until about a week ago.  No new injury or change in activity.  The pain he has is worse with activity as well as  going from a seated to standing position.  He takes an occasional Aleve which does provide some relief.  Review of systems: As detailed in HPI.  All others reviewed and are negative.   Objective: Vital Signs: There were no vitals taken for this visit.  Physical Exam well-developed well-nourished gentleman in no acute distress.  Alert and oriented x3.  Ortho Exam bilateral knee exams show no effusion.  Range of motion 0 to 120 degrees.  No joint line tenderness.  Marked patellofemoral crepitus.  Is neurovascular intact distally.  Specialty Comments:  No specialty comments available.  Imaging: No results found.   PMFS History: Patient Active Problem List   Diagnosis Date Noted   Prostate cancer ([place]) 04/04/2020   Malignant neoplasm of prostate ([place]) 01/17/2020   Acute gout due to renal impairment involving hand 08/12/2017   Essential hypertension 08/12/2017   Unilateral primary osteoarthritis, left knee 05/25/2016   Unilateral primary osteoarthritis, right knee 05/25/2016   Past Medical History:  Diagnosis Date   Arthritis    shoulders    Gout    High cholesterol    Hypertension    Prostate cancer ([place])     Family History  Problem Relation Age of Onset   Colon cancer Mother    Pancreatic cancer Maternal Uncle    Colon polyps  Neg Hx    Esophageal cancer Neg Hx    Rectal cancer Neg Hx     Past Surgical History:  Procedure Laterality Date   KNEE ARTHROSCOPY     LUMBAR DISC SURGERY     LYMPHADENECTOMY Bilateral 04/04/2020   Procedure: LYMPHADENECTOMY, PELVIC;  Surgeon: Raynelle Bring, MD;  Location: [facility];  Service: Urology;  Laterality: Bilateral;   ROBOT ASSISTED LAPAROSCOPIC RADICAL PROSTATECTOMY N/A 04/04/2020   Procedure: XI ROBOTIC ASSISTED LAPAROSCOPIC RADICAL PROSTATECTOMY LEVEL 2;  Surgeon: Raynelle Bring, MD;  Location: [facility];  Service: Urology;  Laterality: N/A;   Social History   Occupational History    Comment: patient does not work full time  Tobacco  Use   Smoking status: Every Day    Packs/day: 0.50    Types: Cigarettes   Smokeless tobacco: Never   Tobacco comments:    Started smoking at the age of 4  Vaping Use   Vaping Use: Never used  Substance and Sexual Activity   Alcohol use: Yes    Alcohol/week: 14.0 standard drinks    Types: 14 Cans of beer per week   Drug use: No   Sexual activity: Yes

## 2021-02-16 ENCOUNTER — Other Ambulatory Visit (INDEPENDENT_AMBULATORY_CARE_PROVIDER_SITE_OTHER): Payer: Self-pay | Admitting: Primary Care

## 2021-02-16 DIAGNOSIS — Z8739 Personal history of other diseases of the musculoskeletal system and connective tissue: Secondary | ICD-10-CM

## 2021-02-16 NOTE — Telephone Encounter (Signed)
Routed to PCP 

## 2021-03-01 ENCOUNTER — Other Ambulatory Visit: Payer: Self-pay | Admitting: Obstetrics and Gynecology

## 2021-03-01 ENCOUNTER — Other Ambulatory Visit: Payer: Self-pay

## 2021-03-01 NOTE — Patient Outreach (Signed)
Medicaid Managed Care   Nurse Care Manager Note  03/01/2021 Name:  Wayne [name] MRN:  [id number] DOB:  01/03/1961  Wayne [name] is an 61 y.o. year old male who is a primary patient of Wayne Perna, NP.  The [facility] Managed Care Coordination team was consulted for assistance with:    Chronic healthcare management needs, tobacco use, gout, HTN, osteoarthritis, h/o prostate cancer  Wayne [name] was given information about Medicaid Managed Care Coordination team services today. Wayne [name] Patient agreed to services and verbal consent obtained.  Engaged with patient by telephone for follow up visit in response to provider referral for case management and/or care coordination services.   Assessments/Interventions:  Review of past medical history, allergies, medications, health status, including review of consultants reports, laboratory and other test data, was performed as part of comprehensive evaluation and provision of chronic care management services.  SDOH (Social Determinants of Health) assessments and interventions performed: SDOH Interventions    Flowsheet Row Most Recent Value  SDOH Interventions   Intimate Partner Violence Interventions Intervention Not Indicated  Transportation Interventions Intervention Not Indicated       Care Plan  Allergies  Allergen Reactions   Hydralazine Other (See Comments)    Headache    Medications Reviewed Today     Reviewed by Gayla Medicus, RN (Registered Nurse) on 03/01/21 at [place] List Status: <None>   Medication Order Taking? Sig Documenting Provider Last Dose Status Informant  allopurinol (ZYLOPRIM) 300 MG tablet [id number]  TAKE ONE TABLET BY MOUTH ONCE DAILY Wayne Perna, NP  Active   amLODipine (NORVASC) 10 MG tablet [id number]  TAKE ONE TABLET BY MOUTH ONCE DAILY Wayne Perna, NP  Active   atorvastatin (LIPITOR) 40 MG tablet [id number] No Take 1 tablet (40 mg total) by mouth daily. Wayne Perna,  NP Taking Active   hydrochlorothiazide (HYDRODIURIL) 25 MG tablet [id number] No Take 1 tablet (25 mg total) by mouth daily. Wayne Perna, NP Taking Active   losartan (COZAAR) 100 MG tablet [id number] No TAKE 1 TABLET(100 MG) BY MOUTH DAILY Wayne Perna, NP Taking Active   nicotine (NICODERM CQ) 14 mg/24hr patch [id number] No Place 1 patch (14 mg total) onto the skin daily. Wayne Perna, NP Taking Active   nicotine (NICODERM CQ) 7 mg/24hr patch [id number] No Place 1 patch (7 mg total) onto the skin daily.  Patient not taking: Reported on 12/09/2020   Wayne Perna, NP Not Taking Active   tadalafil (CIALIS) 10 MG tablet [id number] No Take 1 tablet (10 mg total) by mouth every other day as needed for erectile dysfunction. Wayne Perna, NP Taking Active   Med List Note Wayne Perna, NP 06/27/20 1021):                Patient Active Problem List   Diagnosis Date Noted   Prostate cancer ([place]) 04/04/2020   Malignant neoplasm of prostate ([place]) 01/17/2020   Acute gout due to renal impairment involving hand 08/12/2017   Essential hypertension 08/12/2017   Unilateral primary osteoarthritis, left knee 05/25/2016   Unilateral primary osteoarthritis, right knee 05/25/2016   Conditions to be addressed/monitored per PCP order:  Chronic healthcare management needs, tobacco use, gout, HTN, osteoarthritis, h/o prostate cancer  Care Plan : General Plan of Care (Adult)  Updates made by Gayla Medicus, RN since 03/01/2021 12:00 AM     Problem: Health Promotion or Disease Self-Management ([place])  Priority: Medium  Onset Date: 04/25/2020     Long-Range Goal: Self-Management Plan Developed   Start Date: 04/25/2020  Expected End Date: 04/04/2021  Recent Progress: On track  Priority: Medium  Note:    Current Barriers:   Knowledge Deficits related to short term plan for care coordination needs and long term plans for chronic disease management needs.   03/01/21:  Patient's knee feeling better-saw Dr. Erlinda Hong 02/03/21 and received injection with good relief of pain.  Has appt today at Dr. Lynne Logan office for urology bloodwork and will see him next week.  Patient still smoking about 8 cigarettes a day-to see PCP 03/13/21  and inquire about other medication as patches not helping him.  B/P stable at 116/81. Nurse Case Manager Clinical Goal(s):  patient will work with care management team to address care coordination and chronic disease management needs related to Disease Management Educational Needs Care Coordination Medication Management and Education Medication Reconciliation Medication Assistance  Psychosocial Support   Interventions:  Evaluation of current treatment plan and patient's adherence to plan as established by provider. Provided education to patient re: post surgical pain. Reviewed medications with patient.   [name] with Pharmacy  regarding medications. Discussed plans with patient for ongoing care management follow up and provided patient with direct contact information for care management team Reviewed scheduled/upcoming provider appointments.  Pharmacy referral for medication review-Pharmacist following Collaborated with PCP for nicotine patch to stop smoking-completed. 02/01/21:  Patient smoking 8 cigarettes a day and using patch.  Has resources. Self Care Activities:  Patient will self administer medications as prescribed Patient will attend all scheduled provider appointments Patient will call pharmacy for medication refills Patient will call provider office for new concerns or questions Patient Goals: Over the next 30 days, patient will attend all provider appointments. Over the next 30 days, patient will start nicotine patch.-completed Over the next 30 days, patient will speak with Pharmacist regarding medications. Over the next 30 days, patient will check blood pressure on a regular basis. . Follow Up Plan: The care  management team will reach out to the patient again over the next 30 days.     Follow Up:  Patient agrees to Care Plan and Follow-up.  Plan: The Managed Medicaid care management team will reach out to the patient again over the next 30 days. and The  Patient has been provided with contact information for the Managed Medicaid care management team and has been advised to call with any health related questions or concerns.  Date/time of next scheduled RN care management/care coordination outreach:  03/29/21 at 1030.

## 2021-03-01 NOTE — Patient Instructions (Signed)
Hey Wayne [name], Wayne [name], thank you for speaking to me this morning-I hope your appointmenst go well, have a wonderful day!!  Wayne [name] was given information about Medicaid Managed Care team care coordination services as a part of their [name] Medicaid benefit. Wayne [name] verbally consented to engagement with the [facility] Managed Care team.   If you are experiencing a medical emergency, please call 911 or report to your local emergency department or urgent care.   If you have a non-emergency medical problem during routine business hours, please contact your provider's office and ask to speak with a nurse.   For questions related to your [facility], please call: [phone] or visit the homepage here: [email or web address]  If you would like to schedule transportation through your [facility], please call the following number at least 2 days in advance of your appointment: [phone].   Call the [place] at [phone], at any time, 24 hours a day, 7 days a week. If you are in danger or need immediate medical attention call 911.  If you would like help to quit smoking, call 1-800-QUIT-NOW (573)161-7867) OR Espaol: 1-855-Djelo-Ya (8-366-294-7654) o para ms informacin haga clic aqu or Text READY to 200-400 to register via text  Wayne [name] - following are the goals we discussed in your visit today:   Goals Addressed             This Visit's Progress    Disease Progression Prevented or Minimized       03/01/21:  Patient having urology bloodwork today and will see [name] next week.  Has appt with PCP 03/13/21 and recently saw Dr. Erlinda Hong for cortisone injection in knee which has been very helpful   The patient verbalized understanding of instructions provided today and declined a print copy of patient instruction materials.   The Managed  Medicaid care management team will reach out to the patient again over the next 30 days.  The  Patient has been provided with contact information for the Managed Medicaid care management team and has been advised to call with any health related questions or concerns.   [name] RN, BSN [place] Management Coordinator - Managed Medicaid High Risk [phone]   Following is a copy of your plan of care:  Care Plan : General Plan of Care (Adult)  Updates made by Gayla Medicus, RN since 03/01/2021 12:00 AM     Problem: Health Promotion or Disease Self-Management (General Plan of Care)   Priority: Medium  Onset Date: 04/25/2020     Long-Range Goal: Self-Management Plan Developed   Start Date: 04/25/2020  Expected End Date: 04/04/2021  Recent Progress: On track  Priority: Medium  Note:   Current Barriers:   Knowledge Deficits related to short term plan for care coordination needs and long term plans for chronic disease management needs.  03/01/21:  Patient's knee feeling better-saw Dr. Erlinda Hong 02/03/21 and received injection with good relief of pain.  Has appt today at Dr. Lynne Logan office for urology bloodwork and will see him next week.  Patient still smoking about 8 cigarettes a day-to see PCP 03/13/21  and inquire about other medication as patches not helping him.  B/P stable at 116/81. Nurse Case Manager Clinical Goal(s):  patient will work with care management team to address care coordination and chronic disease management needs related to Disease Management Educational Needs Care Coordination Medication Management  and Education Medication Reconciliation Medication Assistance  Psychosocial Support   Interventions:  Evaluation of current treatment plan and patient's adherence to plan as established by provider. Provided education to patient re: post surgical pain. Reviewed medications with patient.   [name] with Pharmacy  regarding  medications. Discussed plans with patient for ongoing care management follow up and provided patient with direct contact information for care management team Reviewed scheduled/upcoming provider appointments.  Pharmacy referral for medication review-Pharmacist following Collaborated with PCP for nicotine patch to stop smoking-completed. 02/01/21:  Patient smoking 8 cigarettes a day and using patch.  Has resources. Self Care Activities:  Patient will self administer medications as prescribed Patient will attend all scheduled provider appointments Patient will call pharmacy for medication refills Patient will call provider office for new concerns or questions Patient Goals: Over the next 30 days, patient will attend all provider appointments. Over the next 30 days, patient will start nicotine patch.-completed Over the next 30 days, patient will speak with Pharmacist regarding medications. Over the next 30 days, patient will check blood pressure on a regular basis. . Follow Up Plan: The care management team will reach out to the patient again over the next 30 days.

## 2021-03-08 DIAGNOSIS — N5201 Erectile dysfunction due to arterial insufficiency: Secondary | ICD-10-CM | POA: Diagnosis not present

## 2021-03-08 DIAGNOSIS — N393 Stress incontinence (female) (male): Secondary | ICD-10-CM | POA: Diagnosis not present

## 2021-03-08 DIAGNOSIS — C61 Malignant neoplasm of prostate: Secondary | ICD-10-CM | POA: Diagnosis not present

## 2021-03-13 ENCOUNTER — Encounter (INDEPENDENT_AMBULATORY_CARE_PROVIDER_SITE_OTHER): Payer: Self-pay | Admitting: Primary Care

## 2021-03-13 ENCOUNTER — Ambulatory Visit (INDEPENDENT_AMBULATORY_CARE_PROVIDER_SITE_OTHER): Payer: Medicaid Other | Admitting: Primary Care

## 2021-03-13 ENCOUNTER — Other Ambulatory Visit: Payer: Self-pay

## 2021-03-13 VITALS — BP 120/84 | HR 74 | Temp 98.0°F | Ht 75.0 in | Wt 248.4 lb

## 2021-03-13 DIAGNOSIS — Z760 Encounter for issue of repeat prescription: Secondary | ICD-10-CM | POA: Diagnosis not present

## 2021-03-13 DIAGNOSIS — I10 Essential (primary) hypertension: Secondary | ICD-10-CM | POA: Diagnosis not present

## 2021-03-13 DIAGNOSIS — Z8739 Personal history of other diseases of the musculoskeletal system and connective tissue: Secondary | ICD-10-CM

## 2021-03-13 MED ORDER — IBUPROFEN 800 MG PO TABS: 800.0000 mg | ORAL_TABLET | Freq: Three times a day (TID) | ORAL | 1 refills | Status: DC | PRN

## 2021-03-13 MED ORDER — ALLOPURINOL 300 MG PO TABS: 300.0000 mg | ORAL_TABLET | Freq: Every day | ORAL | 1 refills | Status: DC

## 2021-03-13 MED ORDER — CHLORTHALIDONE 25 MG PO TABS
25.0000 mg | ORAL_TABLET | Freq: Every day | ORAL | 0 refills | Status: DC
Start: 2021-03-13 — End: 2021-06-12

## 2021-03-13 MED ORDER — AMLODIPINE BESYLATE 10 MG PO TABS: 10.0000 mg | ORAL_TABLET | Freq: Every day | ORAL | 1 refills | Status: DC

## 2021-03-13 NOTE — Progress Notes (Signed)
[facility]   [name] is a 61 y.o. male presents for hypertension evaluation, Denies shortness of breath, headaches, chest pain or lower extremity edema, sudden onset, vision changes, unilateral weakness, dizziness, paresthesias   Patient reports adherence with medications.  Dietary habits include: low sodium diet  Exercise habits include:yes/walking and yard work  Family / Social history: None   Past Medical History:  Diagnosis Date   Arthritis    shoulders    Gout    High cholesterol    Hypertension    Prostate cancer [facility])    Past Surgical History:  Procedure Laterality Date   KNEE ARTHROSCOPY     LUMBAR DISC SURGERY     LYMPHADENECTOMY Bilateral 04/04/2020   Procedure: LYMPHADENECTOMY, PELVIC;  Surgeon: Raynelle Bring, MD;  Location: [facility]S;  Service: Urology;  Laterality: Bilateral;   ROBOT ASSISTED LAPAROSCOPIC RADICAL PROSTATECTOMY N/A 04/04/2020   Procedure: XI ROBOTIC ASSISTED LAPAROSCOPIC RADICAL PROSTATECTOMY LEVEL 2;  Surgeon: Raynelle Bring, MD;  Location: [facility];  Service: Urology;  Laterality: N/A;   Allergies  Allergen Reactions   Hydralazine Other (See Comments)    Headache   Current Outpatient Medications on File Prior to Visit  Medication Sig Dispense Refill   allopurinol (ZYLOPRIM) 300 MG tablet TAKE ONE TABLET BY MOUTH ONCE DAILY 90 tablet 0   amLODipine (NORVASC) 10 MG tablet TAKE ONE TABLET BY MOUTH ONCE DAILY 90 tablet 0   atorvastatin (LIPITOR) 40 MG tablet Take 1 tablet (40 mg total) by mouth daily. 90 tablet 3   hydrochlorothiazide (HYDRODIURIL) 25 MG tablet Take 1 tablet (25 mg total) by mouth daily. 90 tablet 3   losartan (COZAAR) 100 MG tablet TAKE 1 TABLET(100 MG) BY MOUTH DAILY 90 tablet 1   tadalafil (CIALIS) 10 MG tablet Take 1 tablet (10 mg total) by mouth every other day as needed for erectile dysfunction. 10 tablet 1   buPROPion (WELLBUTRIN SR) 150 MG 12 hr tablet Take 150 mg by mouth 2 (two) times daily.      No current facility-administered medications on file prior to visit.   Social History   Socioeconomic History   Marital status: Married    Spouse name: Mazon   Number of children: 4   Years of education: Not on file   Highest education level: Not on file  Occupational History    Comment: patient does not work full time  Tobacco Use   Smoking status: Every Day    Packs/day: 0.50    Types: Cigarettes   Smokeless tobacco: Never   Tobacco comments:    Started smoking at the age of 50  Vaping Use   Vaping Use: Never used  Substance and Sexual Activity   Alcohol use: Yes    Alcohol/week: 14.0 standard drinks    Types: 14 Cans of beer per week   Drug use: No   Sexual activity: Yes  Other Topics Concern   Not on file  Social History Narrative   Not on file   Social Determinants of Health   Financial Resource Strain: Low Risk    Difficulty of Paying Living Expenses: Not hard at all  Food Insecurity: No Food Insecurity   Worried About Charity fundraiser in the Last Year: Never true   [place] in the Last Year: Never true  Transportation Needs: No Transportation Needs   Lack of Transportation (Medical): No   Lack of Transportation (Non-Medical): No  Physical Activity: Insufficiently Active   Days  of Exercise per Week: 2 days   Minutes of Exercise per Session: 10 min  Stress: No Stress Concern Present   Feeling of Stress : Not at all  Social Connections: Moderately Integrated   Frequency of Communication with Friends and Family: More than three times a week   Frequency of Social Gatherings with Friends and Family: More than three times a week   Attends Religious Services: 1 to 4 times per year   Active Member of [employer] or Organizations: No   Attends Archivist Meetings: Never   Marital Status: Married  Human resources officer Violence: Not At Risk   Fear of Current or Ex-Partner: No   Emotionally Abused: No   Physically Abused: No   Sexually Abused: No    Family History  Problem Relation Age of Onset   Colon cancer Mother    Pancreatic cancer Maternal Uncle    Colon polyps Neg Hx    Esophageal cancer Neg Hx    Rectal cancer Neg Hx    OBJECTIVE:  Vitals:   03/13/21 1102  BP: 120/84  Pulse: 74  Temp: 98 F (36.7 C)  TempSrc: Oral  SpO2: 95%  Weight: 248 lb 6.4 oz (112.7 kg)  Height: 6\' 3"  (1.905 m)   Physical exam: General: Vital signs reviewed.  Patient is well-developed and well-nourished, obese in no acute distress and cooperative with exam. Head: Normocephalic and atraumatic. Eyes: EOMI, conjunctivae normal, no scleral icterus. Neck: Supple, trachea midline, normal ROM, no JVD, masses, thyromegaly, or carotid bruit present. Cardiovascular: RRR, S1 normal, S2 normal, no murmurs, gallops, or rubs. Pulmonary/Chest: Clear to auscultation bilaterally, no wheezes, rales, or rhonchi. Abdominal: Soft, non-tender, non-distended, BS +, no masses, organomegaly, or guarding present. Musculoskeletal: No joint deformities, erythema, or stiffness, ROM full and nontender. Extremities: No lower extremity edema bilaterally,  pulses symmetric and intact bilaterally. No cyanosis or clubbing. Neurological: A&O x3, Strength is normal Skin: Warm, dry and intact. No rashes or erythema. Psychiatric: Normal mood and affect. speech and behavior is normal. Cognition and memory are normal.    ROS Comprehensive ROS Pertinent positive and negative noted in HPI   Last 3 Office BP readings: BP Readings from Last 3 Encounters:  03/13/21 120/84  12/09/20 (!) 144/89  06/09/20 133/81    BMET    Component Value Date/Time   NA 141 03/30/2020 1041   NA 141 06/10/2019 1045   K 3.6 03/30/2020 1041   CL 106 03/30/2020 1041   CO2 21 (L) 03/30/2020 1041   GLUCOSE 105 (H) 03/30/2020 1041   BUN 12 03/30/2020 1041   BUN 14 06/10/2019 1045   CREATININE 0.85 03/30/2020 1041   CALCIUM 9.4 03/30/2020 1041   GFRNONAA >60 03/30/2020 1041   GFRAA 82  06/10/2019 1045    Renal function: CrCl cannot be calculated (Patient's most recent lab result is older than the maximum 21 days allowed.).  Clinical ASCVD: Yes  The 10-year ASCVD risk score (Arnett DK, et al., 2019) is: 19.7%   Values used to calculate the score:     Age: 31 years     Sex: Male     Is Non-Hispanic African American: Yes     Diabetic: No     Tobacco smoker: Yes     Systolic Blood Pressure: 564 mmHg     Is BP treated: Yes     HDL Cholesterol: 37 mg/dL     Total Cholesterol: 146 mg/dL  ASCVD risk factors include- [name]   ASSESSMENT & PLAN:  Dallan was seen today for hypertension.  Diagnoses and all orders for this visit:  Medication refill -     amLODipine (NORVASC) 10 MG tablet; Take 1 tablet (10 mg total) by mouth daily.  History of gout D/C HCTZ to hopefully lessen attacks. -     allopurinol (ZYLOPRIM) 300 MG tablet; Take 1 tablet (300 mg total) by mouth daily.  Other orders -     chlorthalidone (HYGROTON) 25 MG tablet; Take 1 tablet (25 mg total) by mouth daily. -     ibuprofen (ADVIL) 800 MG tablet; Take 1 tablet (800 mg total) by mouth every 8 (eight) hours as needed for moderate pain.   Essential hypertension -Counseled on lifestyle modifications for blood pressure control including reduced dietary sodium, increased exercise, weight reduction and adequate sleep. Also, educated patient about the risk for cardiovascular events, stroke and heart attack. Also counseled patient about the importance of medication adherence. If you participate in smoking, it is important to stop using tobacco as this will increase the risks associated with uncontrolled blood pressure.   -Hypertension longstanding diagnosed currently  on amlodipine 10 mg daily , Cozaar 100 mg daily and HCTZ 25mg  daily are his current medications. Patient is adherent with current medications.   Goal BP:  For patients younger than 60: Goal BP < 130/80. For patients 60 and older: Goal BP <  140/90. For patients with diabetes: Goal BP < 130/80. Your most recent BP: 120/84  Minimize salt intake. Minimize alcohol intake  This note has been created with Surveyor, quantity. Any transcriptional errors are unintentional.   [name], NP 03/13/2021, 11:15 AM

## 2021-03-21 ENCOUNTER — Ambulatory Visit: Payer: Self-pay

## 2021-03-29 ENCOUNTER — Other Ambulatory Visit: Payer: Self-pay

## 2021-03-29 ENCOUNTER — Other Ambulatory Visit: Payer: Self-pay | Admitting: Obstetrics and Gynecology

## 2021-03-29 NOTE — Patient Instructions (Signed)
Hi Wayne [name] you are doing well -have a great day! ? ?Wayne [name] was given information about Medicaid Managed Care team care coordination services as a part of their [place] Medicaid benefit. Wayne [name] verbally consented to engagement with the [facility] Managed Care team.  ? ?If you are experiencing a medical emergency, please call 911 or report to your local emergency department or urgent care.  ? ?If you have a non-emergency medical problem during routine business hours, please contact your provider's office and ask to speak with a nurse.  ? ?For questions related to your [facility], please call: [phone] or visit the homepage here: [email or web address] ? ?If you would like to schedule transportation through your [facility], please call the following number at least 2 days in advance of your appointment: [phone]. ? Rides for urgent appointments can also be made after hours by calling Member Services. ? ?Call the [place] at [phone], at any time, 24 hours a day, 7 days a week. If you are in danger or need immediate medical attention call 911. ? ?If you would like help to quit smoking, call 1-800-QUIT-NOW (754)670-8144) OR Espa?ol: 1-855-D?jelo-Ya (215)525-0497) o para m?s informaci?n haga clic aqu? or Text READY to 200-400 to register via text ? ?Wayne [name] - following are the goals we discussed in your visit today:  ? Goals Addressed   ? ?  ?  ?  ?  ? This Visit's Progress  ?  Disease Progression Prevented or Minimized     ?  03/29/21:  Patient without complaint today-feels well.  Appointment with [name] in February WNL.  Seen and evaluated by PCP 03/13/21-has f/u 04/29/21.  ? ?Patient verbalizes understanding of instructions and care plan provided today and agrees to view in [place]. Active MyChart status confirmed with patient.   ? ?The  Managed Medicaid care management team will reach out to the patient again over the next 30 days.  ?The  Patient  has been provided with contact information for the Managed Medicaid care management team and has been advised to call with any health related questions or concerns.  ? ?Wayne Raider RN, BSN ?[facility] ?Care Management Coordinator - Managed Medicaid High Risk ?[phone] ?  ?Following is a copy of your plan of care:  ?Care Plan : General Plan of Care (Adult)  ?Updates made by Gayla Medicus, RN since 03/29/2021 12:00 AM  ?  ? ?Problem: Health Promotion or Disease Self-Management (General Plan of Care)   ?Priority: Medium  ?Onset Date: 04/25/2020  ?  ? ?Long-Range Goal: Self-Management Plan Developed   ?Start Date: 04/25/2020  ?Expected End Date: 06/29/2021  ?Recent Progress: On track  ?Priority: Medium  ?Note:   ? ?Current Barriers:   ?Knowledge Deficits related to short term plan for care coordination needs and long term plans for chronic disease management needs.  ?03/29/21:  Patient doing well today-no complaints. New BP medication prescribed 03/13/21 by PCP and is feeling better.  BP 132-148/83. To follow up 04/29/21.  Good report from [name] last month.  No change in smoking-8 cigarettes a day, has not inquired regarding new medication for stopping smoking. ?Nurse Case Manager Clinical Goal(s):  ?patient will work with care management team to address care coordination and chronic disease management needs related to Disease Management ?Educational Needs ?Care Coordination ?Medication Management and Education ?Medication Reconciliation ?Medication Assistance  ?Psychosocial Support   ?  Interventions:  ?Evaluation of current treatment plan and patient's adherence to plan as established by provider. ?Provided education to patient re: post surgical pain. ?Reviewed medications with patient.   ?.Collaborated with Pharmacy  regarding medications. ?Discussed plans with patient for ongoing  care management follow up and provided patient with direct contact information for care management team ?Reviewed scheduled/upcoming provider appointments.  ?Pharmacy referral for medication review-Pharmacist following ?Collaborated with PCP for nicotine patch to stop smoking-completed. ?03/29/21:  Patient smoking 8 cigarettes a day. Has resources. ?Self Care Activities:  ?Patient will self administer medications as prescribed ?Patient will attend all scheduled provider appointments ?Patient will call pharmacy for medication refills ?Patient will call provider office for new concerns or questions ?Patient Goals: ?Over the next 30 days, patient will attend all provider appointments. ?Over the next 30 days, patient will start nicotine patch.-completed ?Over the next 30 days, patient will speak with Pharmacist regarding medications. ?Over the next 30 days, patient will check blood pressure on a regular basis. ?. ?Follow Up Plan: The care management team will reach out to the patient again over the next 30 days ?  ?  ?

## 2021-03-29 NOTE — Patient Outreach (Signed)
?Medicaid Managed Care   ?Nurse Care Manager Note ? ?03/29/2021 ?Name:  Wayne [name] MRN:  [id number] DOB:  1960/06/03 ? ?Wayne [name] is an 61 y.o. year old male who is a primary patient of Wayne Perna, NP.  The [facility] Managed Care Coordination team was consulted for assistance with:    ?Chronic healthcare management needs, tobacco use, HTN, gout, osteoarthritis ? ?Wayne [name] was given information about Medicaid Managed Care Coordination team services today. Wayne [name] Patient agreed to services and verbal consent obtained. ? ?Engaged with patient by telephone for follow up visit in response to provider referral for case management and/or care coordination services.  ? ?Assessments/Interventions:  Review of past medical history, allergies, medications, health status, including review of consultants reports, laboratory and other test data, was performed as part of comprehensive evaluation and provision of chronic care management services. ? ?SDOH (Social Determinants of Health) assessments and interventions performed: ?SDOH Interventions   ? ?Flowsheet Row Most Recent Value  ?SDOH Interventions   ?Food Insecurity Interventions Intervention Not Indicated  ?Financial Strain Interventions Intervention Not Indicated  ? ?  ? ?Care Plan ? ?Allergies  ?Allergen Reactions  ? Hydralazine Other (See Comments)  ?  Headache  ? ? ?Medications Reviewed Today   ? ? Reviewed by Wayne Medicus, RN (Registered Nurse) on 03/29/21 at [place] List Status: <None>  ? ?Medication Order Taking? Sig Documenting Provider Last Dose Status Informant  ?allopurinol (ZYLOPRIM) 300 MG tablet [id number]  Take 1 tablet (300 mg total) by mouth daily. Wayne Perna, NP  Active   ?amLODipine (NORVASC) 10 MG tablet [id number]  Take 1 tablet (10 mg total) by mouth daily. Wayne Perna, NP  Active   ?atorvastatin (LIPITOR) 40 MG tablet [id number] No Take 1 tablet (40 mg total) by mouth daily. Wayne Perna, NP Taking  Active   ?chlorthalidone (HYGROTON) 25 MG tablet [id number]  Take 1 tablet (25 mg total) by mouth daily. Wayne Perna, NP  Active   ?ibuprofen (ADVIL) 800 MG tablet [id number]  Take 1 tablet (800 mg total) by mouth every 8 (eight) hours as needed for moderate pain. Wayne Perna, NP  Active   ?losartan (COZAAR) 100 MG tablet [id number] No TAKE 1 TABLET(100 MG) BY MOUTH DAILY Wayne Perna, NP Taking Active   ?tadalafil (CIALIS) 10 MG tablet [id number] No Take 1 tablet (10 mg total) by mouth every other day as needed for erectile dysfunction. Wayne Perna, NP Taking Active   ?Med List Note Wayne [name], [place] 06/27/20 1021):  ? ? ?  ? ?  ?  ? ?  ? ?Patient Active Problem List  ? Diagnosis Date Noted  ? Prostate cancer ([place]) 04/04/2020  ? Malignant neoplasm of prostate ([place]) 01/17/2020  ? Acute gout due to renal impairment involving hand 08/12/2017  ? Essential hypertension 08/12/2017  ? Unilateral primary osteoarthritis, left knee 05/25/2016  ? Unilateral primary osteoarthritis, right knee 05/25/2016  ? ?Conditions to be addressed/monitored per PCP order:  Chronic healthcare management needs, tobacco use, HTN, gout, osteoarthritis, h/o prostate cancer ? ?Care Plan : General Plan of Care (Adult)  ?Updates made by Wayne Medicus, RN since 03/29/2021 12:00 AM  ?  ? ?Problem: Health Promotion or Disease Self-Management (General Plan of Care)   ?Priority: Medium  ?Onset Date: 04/25/2020  ?  ? ?Long-Range Goal: Self-Management Plan Developed   ?Start Date: 04/25/2020  ?Expected End Date: 06/29/2021  ?Recent Progress: On track  ?  Priority: Medium  ?Note:   ? ?Current Barriers:   ?Knowledge Deficits related to short term plan for care coordination needs and long term plans for chronic disease management needs.  ?03/29/21:  Patient doing well today-no complaints. New BP medication prescribed 03/13/21 by PCP and is feeling better.  BP 132-148/83. To follow up 04/29/21.  Good report from Wayne [name] last month.  No  change in smoking-8 cigarettes a day, has not inquired regarding new medication for stopping smoking. ?Nurse Case Manager Clinical Goal(s):  ?patient will work with care management team to address care coordination and chronic disease management needs related to Disease Management ?Educational Needs ?Care Coordination ?Medication Management and Education ?Medication Reconciliation ?Medication Assistance  ?Psychosocial Support   ?Interventions:  ?Evaluation of current treatment plan and patient's adherence to plan as established by provider. ?Provided education to patient re: post surgical pain. ?Reviewed medications with patient.   ?.Collaborated with Pharmacy  regarding medications. ?Discussed plans with patient for ongoing care management follow up and provided patient with direct contact information for care management team ?Reviewed scheduled/upcoming provider appointments.  ?Pharmacy referral for medication review-Pharmacist following ?Collaborated with PCP for nicotine patch to stop smoking-completed. ?03/29/21:  Patient smoking 8 cigarettes a day. Has resources. ?Self Care Activities:  ?Patient will self administer medications as prescribed ?Patient will attend all scheduled provider appointments ?Patient will call pharmacy for medication refills ?Patient will call provider office for new concerns or questions ?Patient Goals: ?Over the next 30 days, patient will attend all provider appointments. ?Over the next 30 days, patient will start nicotine patch.-completed ?Over the next 30 days, patient will speak with Pharmacist regarding medications. ?Over the next 30 days, patient will check blood pressure on a regular basis. ?. ?Follow Up Plan: The care management team will reach out to the patient again over the next 30 days.  ? ?Follow Up:  Patient agrees to Care Plan and Follow-up. ? ?Plan: The Managed Medicaid care management team will reach out to the patient again over the next 30 days. and The  Patient has  been provided with contact information for the Managed Medicaid care management team and has been advised to call with any health related questions or concerns. ? ?Date/time of next scheduled RN care management/care coordination outreach:  04/25/21 at 0900. ?

## 2021-04-24 ENCOUNTER — Telehealth (INDEPENDENT_AMBULATORY_CARE_PROVIDER_SITE_OTHER): Payer: Medicaid Other | Admitting: Primary Care

## 2021-04-24 DIAGNOSIS — Z0130 Encounter for examination of blood pressure without abnormal findings: Secondary | ICD-10-CM | POA: Diagnosis not present

## 2021-04-24 NOTE — Progress Notes (Signed)
?[place] ? ?Virtual Visit via Telephone Note ? ?I connected with [name], on 04/24/2021 at 10:18 AM through an audio and video application and verified that I am speaking with the correct person using two identifiers. ?  ?Consent: ?I discussed the limitations, risks, security and privacy concerns of performing an evaluation and management service by telephone and the availability of in person appointments. I also discussed with the patient that there may be a patient responsible charge related to this service. The patient expressed understanding and agreed to proceed. ? ? ?Location of Patient: ?Home ? ?Location of Provider: ?[facility]  ? ?Persons participating in Telemedicine visit: ?OSEPH IMBURGIA ?Juluis Mire,  NP ?Llana Aliment , CMA ? ?History of Present Illness: ?[name] is a 61 year old male f/u for hypertension. Patient has No headache, No chest pain, No abdominal pain - No Nausea, No new weakness tingling or numbness, No Cough - shortness of breath ?  ?Past Medical History:  ?Diagnosis Date  ? Arthritis   ? shoulders   ? Gout   ? High cholesterol   ? Hypertension   ? Prostate cancer ([place])   ? ?Allergies  ?Allergen Reactions  ? Hydralazine Other (See Comments)  ?  Headache  ? ? ?Current Outpatient Medications on File Prior to Visit  ?Medication Sig Dispense Refill  ? allopurinol (ZYLOPRIM) 300 MG tablet Take 1 tablet (300 mg total) by mouth daily. 90 tablet 1  ? amLODipine (NORVASC) 10 MG tablet Take 1 tablet (10 mg total) by mouth daily. 90 tablet 1  ? atorvastatin (LIPITOR) 40 MG tablet Take 1 tablet (40 mg total) by mouth daily. 90 tablet 3  ? chlorthalidone (HYGROTON) 25 MG tablet Take 1 tablet (25 mg total) by mouth daily. 90 tablet 0  ? ibuprofen (ADVIL) 800 MG tablet Take 1 tablet (800 mg total) by mouth every 8 (eight) hours as needed for moderate pain. 90 tablet 1  ? losartan (COZAAR) 100 MG tablet  TAKE 1 TABLET(100 MG) BY MOUTH DAILY 90 tablet 1  ? tadalafil (CIALIS) 10 MG tablet Take 1 tablet (10 mg total) by mouth every other day as needed for erectile dysfunction. 10 tablet 1  ? ?No current facility-administered medications on file prior to visit.  ? ? ?Observations/Objective: ?There were no vitals taken for this visit.  ? ?Assessment and Plan: ?Diagnoses and all orders for this visit: ? ?Blood pressure check ?141/82 , 148/83 , 121/84 , 130/79, 137/86 , 136/84 ? Continue chlorthalidone (HYGROTON) '25mg'$  and amlodipine.  ?BP goal - < 130/80 ?Explained that having normal blood pressure is the goal and medications are helping to get to goal and maintain normal blood pressure. ?DIET: Limit salt intake, read nutrition labels to check salt content, limit fried and high fatty foods  ?Avoid using multisymptom OTC cold preparations that generally contain sudafed which can rise BP. Consult with pharmacist on best cold relief products to use for persons with HTN ?EXERCISE ?Discussed incorporating exercise such as walking - 30 minutes most days of the week and can do in 10 minute intervals    ? ?Follow Up Instructions: ?3 months and fasting  ?I discussed the assessment and treatment plan with the patient. The patient was provided an opportunity to ask questions and all were answered. The patient agreed with the plan and demonstrated an understanding of the instructions. ?  ?The patient was advised to call back or seek an  in-person evaluation if the symptoms worsen or if the condition fails to improve as anticipated. ? ? ? ? ?I provided 10 minutes total of non-face-to-face time during this encounter including median intraservice time, reviewing previous notes, investigations, ordering medications, medical decision making, coordinating care and patient verbalized understanding at the end of the visit. ? ? ? ?This note has been created with Surveyor, quantity. Any transcriptional  errors are unintentional.  ? ?[name], NP ?04/24/2021, 10:18 AM  ?

## 2021-04-25 ENCOUNTER — Other Ambulatory Visit: Payer: Self-pay | Admitting: Obstetrics and Gynecology

## 2021-04-25 NOTE — Patient Instructions (Signed)
Hi Mr. Mcgrail to speak with you-have a wonderful day! ? ?Mr. Fontanilla was given information about Medicaid Managed Care team care coordination services as a part of their [place] Medicaid benefit. [name] verbally consented to engagement with the [facility] Managed Care team.  ? ?If you are experiencing a medical emergency, please call 911 or report to your local emergency department or urgent care.  ? ?If you have a non-emergency medical problem during routine business hours, please contact your provider's office and ask to speak with a nurse.  ? ?For questions related to your [facility], please call: [phone] or visit the homepage here: [email or web address] ? ?If you would like to schedule transportation through your [facility], please call the following number at least 2 days in advance of your appointment: [phone]. ? Rides for urgent appointments can also be made after hours by calling Member Services. ? ?Call the [place] at [phone], at any time, 24 hours a day, 7 days a week. If you are in danger or need immediate medical attention call 911. ? ?If you would like help to quit smoking, call 1-800-QUIT-NOW 365-438-3687) OR Espa?ol: 1-855-D?jelo-Ya 917 451 4179) o para m?s informaci?n haga clic aqu? or Text READY to 200-400 to register via text ? ?Mr. Sabra Heck - following are the goals we discussed in your visit today:  ? Goals Addressed   ? ?  ?  ?  ?  ? This Visit's Progress  ?  Disease Progression Prevented or Minimized     ?  04/25/21:  Seen by PCP 04/24/21-no change in medications-blood pressure stable.  ? ?Patient verbalizes understanding of instructions and care plan provided today and agrees to view in [place]. Active MyChart status confirmed with patient.   ? ?The Managed Medicaid care management team will reach out to the patient  again over the next 30 days.  ?The  Patient has been provided with contact information for the Managed Medicaid care management team and has been advised to call with any health related questions or concerns.  ? ?[name] RN, BSN ?[facility] ?Care Management Coordinator - Managed Medicaid High Risk ?[phone] ?  ?Following is a copy of your plan of care:  ?Care Plan : General Plan of Care (Adult)  ?Updates made by Gayla Medicus, RN since 04/25/2021 12:00 AM  ?  ? ?Problem: Health Promotion or Disease Self-Management (General Plan of Care)   ?Priority: Medium  ?Onset Date: 04/25/2020  ?  ? ?Long-Range Goal: Self-Management Plan Developed   ?Start Date: 04/25/2020  ?Expected End Date: 06/29/2021  ?Recent Progress: On track  ?Priority: Medium  ?Note:   ? ?Current Barriers:   ?Knowledge Deficits related to short term plan for care coordination needs and long term plans for chronic disease management needs.  ?04/25/21:  Patient with no complaint today-seen and evaluated by PCP 04/24/21-no changes in medications and blood pressure stable.  Patient to contact PCP about prescribing medication to stop smoking-states patches do not help-still smoking around 8 cigarettes a day. ?Nurse Case Manager Clinical Goal(s):  ?patient will work with care management team to address care coordination and chronic disease management needs related to Disease Management ?Educational Needs ?Care Coordination ?Medication Management and Education ?Medication Reconciliation ?Medication Assistance  ?Psychosocial Support   ?Interventions:  ?Evaluation of current treatment plan and patient's adherence to plan as established by provider. ?Provided education to patient re: post surgical pain. ?Reviewed  medications with patient.   ?.Collaborated with Pharmacy  regarding medications. ?Discussed plans with patient for ongoing care management follow up and provided patient with direct contact information for care management  team ?Reviewed scheduled/upcoming provider appointments.  ?Pharmacy referral for medication review-Pharmacist following ?Collaborated with PCP for nicotine patch to stop smoking-completed. ?03/29/21:  Patient smoking 8 cigarettes a day. Has resources. ?Self Care Activities:  ?Patient will self administer medications as prescribed ?Patient will attend all scheduled provider appointments ?Patient will call pharmacy for medication refills ?Patient will call provider office for new concerns or questions ?Patient Goals: ?Over the next 30 days, patient will attend all provider appointments. ?Over the next 30 days, patient will start nicotine patch.-completed ?Over the next 30 days, patient will speak with Pharmacist regarding medications. ?Over the next 30 days, patient will check blood pressure on a regular basis. ?. ?Follow Up Plan: The care management team will reach out to the patient again over the next 30 days.   ?  ?

## 2021-04-25 NOTE — Patient Outreach (Signed)
?Medicaid Managed Care   ?Nurse Care Manager Note ? ?04/25/2021 ?Name:  Wayne [name] MRN:  [id number] DOB:  16-Feb-1960 ? ?Wayne [name] is an 61 y.o. year old male who is a primary patient of [name], NP.  The [facility] Managed Care Coordination team was consulted for assistance with:    ?Chronic healthcare management needs, HTN, gout, tobacco use, h/p prostate cancer ? ?Wayne [name] was given information about Medicaid Managed Care Coordination team services today. Wayne [name] Patient agreed to services and verbal consent obtained. ? ?Engaged with patient by telephone for follow up visit in response to provider referral for case management and/or care coordination services.  ? ?Assessments/Interventions:  Review of past medical history, allergies, medications, health status, including review of consultants reports, laboratory and other test data, was performed as part of comprehensive evaluation and provision of chronic care management services. ? ?SDOH (Social Determinants of Health) assessments and interventions performed: ?SDOH Interventions   ? ?Flowsheet Row Most Recent Value  ?SDOH Interventions   ?Housing Interventions Intervention Not Indicated  ?Physical Activity Interventions Intervention Not Indicated  [walks as he is able with his knees]  ? ?  ? ?Care Plan ? ?Allergies  ?Allergen Reactions  ? Hydralazine Other (See Comments)  ?  Headache  ? ? ?Medications Reviewed Today   ? ? Reviewed by Gayla Medicus, RN (Registered Nurse) on 04/25/21 at [place] List Status: <None>  ? ?Medication Order Taking? Sig Documenting Provider Last Dose Status Informant  ?allopurinol (ZYLOPRIM) 300 MG tablet [id number]  Take 1 tablet (300 mg total) by mouth daily. [name], NP  Active   ?amLODipine (NORVASC) 10 MG tablet [id number]  Take 1 tablet (10 mg total) by mouth daily. [name], NP  Active   ?atorvastatin (LIPITOR) 40 MG tablet [id number] No Take 1 tablet (40 mg total) by mouth daily.  [name], NP Taking Active   ?chlorthalidone (HYGROTON) 25 MG tablet [id number]  Take 1 tablet (25 mg total) by mouth daily. [name], NP  Active   ?ibuprofen (ADVIL) 800 MG tablet [id number]  Take 1 tablet (800 mg total) by mouth every 8 (eight) hours as needed for moderate pain. [name], NP  Active   ?tadalafil (CIALIS) 10 MG tablet [id number] No Take 1 tablet (10 mg total) by mouth every other day as needed for erectile dysfunction. [name], NP Taking Active   ?Med List Note [name], [place] 06/27/20 1021):  ? ? ?  ? ?  ?  ? ?  ? ?Patient Active Problem List  ? Diagnosis Date Noted  ? Prostate cancer ([place]) 04/04/2020  ? Malignant neoplasm of prostate ([place]) 01/17/2020  ? Acute gout due to renal impairment involving hand 08/12/2017  ? Essential hypertension 08/12/2017  ? Unilateral primary osteoarthritis, left knee 05/25/2016  ? Unilateral primary osteoarthritis, right knee 05/25/2016  ? ?Conditions to be addressed/monitored per PCP order:  Chronic healthcare management needs, HTN, gout, tobacco use, h/p prostate cancer, osteoarthritis ? ?Care Plan : General Plan of Care (Adult)  ?Updates made by Gayla Medicus, RN since 04/25/2021 12:00 AM  ?  ? ?Problem: Health Promotion or Disease Self-Management (General Plan of Care)   ?Priority: Medium  ?Onset Date: 04/25/2020  ?  ? ?Long-Range Goal: Self-Management Plan Developed   ?Start Date: 04/25/2020  ?Expected End Date: 06/29/2021  ?Recent Progress: On track  ?Priority: Medium  ?Note:   ? ?Current Barriers:   ?  Knowledge Deficits related to short term plan for care coordination needs and long term plans for chronic disease management needs.  ?04/25/21:  Patient with no complaint today-seen and evaluated by PCP 04/24/21-no changes in medications and blood pressure stable.  Patient to contact PCP about prescribing medication to stop smoking-states patches do not help-still smoking around 8 cigarettes a day. ?Nurse Case Manager  Clinical Goal(s):  ?patient will work with care management team to address care coordination and chronic disease management needs related to Disease Management ?Educational Needs ?Care Coordination ?Medication Management and Education ?Medication Reconciliation ?Medication Assistance  ?Psychosocial Support   ?Interventions:  ?Evaluation of current treatment plan and patient's adherence to plan as established by provider. ?Provided education to patient re: post surgical pain. ?Reviewed medications with patient.   ?.Collaborated with Pharmacy  regarding medications. ?Discussed plans with patient for ongoing care management follow up and provided patient with direct contact information for care management team ?Reviewed scheduled/upcoming provider appointments.  ?Pharmacy referral for medication review-Pharmacist following ?Collaborated with PCP for nicotine patch to stop smoking-completed. ?03/29/21:  Patient smoking 8 cigarettes a day. Has resources. ?Self Care Activities:  ?Patient will self administer medications as prescribed ?Patient will attend all scheduled provider appointments ?Patient will call pharmacy for medication refills ?Patient will call provider office for new concerns or questions ?Patient Goals: ?Over the next 30 days, patient will attend all provider appointments. ?Over the next 30 days, patient will start nicotine patch.-completed ?Over the next 30 days, patient will speak with Pharmacist regarding medications. ?Over the next 30 days, patient will check blood pressure on a regular basis. ?. ?Follow Up Plan: The care management team will reach out to the patient again over the next 30 days.   ? ?Follow Up:  Patient agrees to Care Plan and Follow-up. ? ?Plan: The Managed Medicaid care management team will reach out to the patient again over the next 30 days. and The  Patient has been provided with contact information for the Managed Medicaid care management team and has been advised to call with any  health related questions or concerns. ? ?Date/time of next scheduled RN care management/care coordination outreach:  05/31/21 at 0900. ?

## 2021-05-07 ENCOUNTER — Other Ambulatory Visit (INDEPENDENT_AMBULATORY_CARE_PROVIDER_SITE_OTHER): Payer: Self-pay | Admitting: Primary Care

## 2021-05-07 DIAGNOSIS — Z760 Encounter for issue of repeat prescription: Secondary | ICD-10-CM

## 2021-05-07 DIAGNOSIS — I10 Essential (primary) hypertension: Secondary | ICD-10-CM

## 2021-05-08 NOTE — Telephone Encounter (Signed)
Sent to PCP ?

## 2021-05-15 ENCOUNTER — Other Ambulatory Visit (INDEPENDENT_AMBULATORY_CARE_PROVIDER_SITE_OTHER): Payer: Self-pay | Admitting: Primary Care

## 2021-05-15 DIAGNOSIS — Z760 Encounter for issue of repeat prescription: Secondary | ICD-10-CM

## 2021-05-15 DIAGNOSIS — Z8739 Personal history of other diseases of the musculoskeletal system and connective tissue: Secondary | ICD-10-CM

## 2021-05-15 NOTE — Telephone Encounter (Signed)
Sent to PCP ?

## 2021-05-16 ENCOUNTER — Other Ambulatory Visit (INDEPENDENT_AMBULATORY_CARE_PROVIDER_SITE_OTHER): Payer: Self-pay | Admitting: Primary Care

## 2021-05-31 ENCOUNTER — Other Ambulatory Visit: Payer: Self-pay | Admitting: Obstetrics and Gynecology

## 2021-05-31 NOTE — Patient Instructions (Signed)
Hey Wayne [name] for speaking with me-have a terrific day!! ? ?Wayne [name] was given information about Medicaid Managed Care team care coordination services as a part of their [place] Medicaid benefit. Wayne [name] verbally consented to engagement with the [facility] Managed Care team.  ? ?If you are experiencing a medical emergency, please call 911 or report to your local emergency department or urgent care.  ? ?If you have a non-emergency medical problem during routine business hours, please contact your provider's office and ask to speak with a nurse.  ? ?For questions related to your [facility], please call: [phone] or visit the homepage here: [email or web address] ? ?If you would like to schedule transportation through your [facility], please call the following number at least 2 days in advance of your appointment: [phone]. ? Rides for urgent appointments can also be made after hours by calling Member Services. ? ?Call the [place] at [phone], at any time, 24 hours a day, 7 days a week. If you are in danger or need immediate medical attention call 911. ? ?If you would like help to quit smoking, call 1-800-QUIT-NOW 531-295-6988) OR Espa?ol: 1-855-D?jelo-Ya 249-115-5896) o para m?s informaci?n haga clic aqu? or Text READY to 200-400 to register via text ? ?Wayne [name] - following are the goals we discussed in your visit today:  ? Goals Addressed   ? ?  ?  ?  ?  ? This Visit's Progress  ?  Disease Progression Prevented or Minimized     ?  05/31/21:  Patient without complaint today-has next PCP appt scheduled for 07/26/21-checks blood pressure occasionally and follow with [name] for h/o prostate cancer  ? ?Patient verbalizes understanding of instructions and care plan provided today and agrees to view in [place]. Active MyChart status  confirmed with patient.   ? ?The Managed Medicaid care management team will reach out to the patient again over the next 30 days.  ?The  Patient  has been provided with contact information for the Managed Medicaid care management team and has been advised to call with any health related questions or concerns.  ? ?[name] RN, BSN ?[facility] ?Care Management Coordinator - Managed Medicaid High Risk ?[phone] ?  ?Following is a copy of your plan of care:  ?Care Plan : General Plan of Care (Adult)  ?Updates made by Gayla Medicus, RN since 05/31/2021 12:00 AM  ?  ? ?Problem: Health Promotion or Disease Self-Management (General Plan of Care)   ?Priority: Medium  ?Onset Date: 04/25/2020  ?  ? ?Long-Range Goal: Self-Management Plan Developed   ?Start Date: 04/25/2020  ?Expected End Date: 08/31/2021  ?Recent Progress: On track  ?Priority: Medium  ?Note:   ? ?Current Barriers:   ?Knowledge Deficits related to short term plan for care coordination needs and long term plans for chronic disease management needs.  ?05/31/21:  No complaints today-checks blood pressure occasionally, smokes about 8 cigarettes a day-discussed methods for stopping-states he will follow up with provider at next appt-7/5.  Blood pressures-122-148/81-95.  Does pelvic exercises maybe once a day to assist with urinary leakage-wears 1 pad a day-no change. ?Nurse Case Manager Clinical Goal(s):  ?patient will work with care management team to address care coordination and chronic disease management needs related to Disease Management ?Educational Needs ?Care Coordination ?Medication Management and Education ?Medication Reconciliation ?Medication Assistance  ?Psychosocial Support   ?Interventions:  ?Evaluation of  current treatment plan and patient's adherence to plan as established by provider. ?Provided education to patient re: post surgical pain. ?Reviewed medications with patient.   ?.Collaborated with Pharmacy  regarding  medications. ?Discussed plans with patient for ongoing care management follow up and provided patient with direct contact information for care management team ?Reviewed scheduled/upcoming provider appointments.  ?Pharmacy referral for medication review-Pharmacist following ?Collaborated with PCP for nicotine patch to stop smoking-completed. ?03/29/21:  Patient smoking 8 cigarettes a day. Has resources. ?Self Care Activities:  ?Patient will self administer medications as prescribed ?Patient will attend all scheduled provider appointments ?Patient will call pharmacy for medication refills ?Patient will call provider office for new concerns or questions ?Patient Goals: ?Over the next 30 days, patient will attend all provider appointments. ?Over the next 30 days, patient will start nicotine patch.-completed ?Over the next 30 days, patient will speak with Pharmacist regarding medications. ?Over the next 30 days, patient will check blood pressure on a regular basis. ?. ?Follow Up Plan: The care management team will reach out to the patient again over the next 30 days.  ?  ?

## 2021-05-31 NOTE — Patient Outreach (Signed)
?Medicaid Managed Care   ?Nurse Care Manager Note ? ?05/31/2021 ?Name:  Wayne [name] MRN:  [id number] DOB:  [birth year] ? ?Wayne [name] is an 61 y.o. year old male who is a primary patient of Wayne Perna, NP.  [facility] Managed Care Coordination team was consulted for assistance with:    ?Chronic healthcare management needs, HTN, gout, tobacco use, osteoarthritis, h/o prostate cancer ? ?Mr. Panepinto was given information about Medicaid Managed Care Coordination team services today. [name] Patient agreed to services and verbal consent obtained. ? ?Engaged with patient by telephone for follow up visit in response to provider referral for case management and/or care coordination services.  ? ?Assessments/Interventions:  Review of past medical history, allergies, medications, health status, including review of consultants reports, laboratory and other test data, was performed as part of comprehensive evaluation and provision of chronic care management services. ? ?SDOH (Social Determinants of Health) assessments and interventions performed: ?SDOH Interventions   ? ?Flowsheet Row Most Recent Value  ?SDOH Interventions   ?Stress Interventions Intervention Not Indicated  ? ?  ?Care Plan ? ?Allergies  ?Allergen Reactions  ? Hydralazine Other (See Comments)  ?  Headache  ? ?Medications Reviewed Today   ? ? Reviewed by Gayla Medicus, RN (Registered Nurse) on 05/31/21 at [place] List Status: <None>  ? ?Medication Order Taking? Sig Documenting Provider Last Dose Status Informant  ?allopurinol (ZYLOPRIM) 300 MG tablet [id number]  TAKE ONE TABLET BY MOUTH EVERY MORNING Wayne Perna, NP  Active   ?amLODipine (NORVASC) 10 MG tablet [id number]  TAKE ONE TABLET BY MOUTH ONCE DAILY Wayne Perna, NP  Active   ?atorvastatin (LIPITOR) 40 MG tablet [id number] No Take 1 tablet (40 mg total) by mouth daily. Wayne Perna, NP Taking Active   ?buPROPion [facility] SR) 150 MG 12 hr tablet [id number]   TAKE ONE TABLET BY MOUTH TWICE DAILY Wayne Perna, NP  Active   ?chlorthalidone (HYGROTON) 25 MG tablet [id number]  Take 1 tablet (25 mg total) by mouth daily. Wayne Perna, NP  Active   ?ibuprofen (ADVIL) 800 MG tablet [id number]  Take 1 tablet (800 mg total) by mouth every 8 (eight) hours as needed for moderate pain. Wayne Perna, NP  Active   ?losartan (COZAAR) 100 MG tablet [id number]  TAKE ONE TABLET BY MOUTH EVERY EVENING Wayne Perna, NP  Active   ?tadalafil (CIALIS) 10 MG tablet [id number] No Take 1 tablet (10 mg total) by mouth every other day as needed for erectile dysfunction. Wayne Perna, NP Taking Active   ?Med List Note Wayne [name], [place] 06/27/20 1021):  ? ? ?  ? ?  ?  ? ?  ? ?Patient Active Problem List  ? Diagnosis Date Noted  ? Prostate cancer ([place]) 04/04/2020  ? Malignant neoplasm of prostate ([place]) 01/17/2020  ? Acute gout due to renal impairment involving hand 08/12/2017  ? Essential hypertension 08/12/2017  ? Unilateral primary osteoarthritis, left knee 05/25/2016  ? Unilateral primary osteoarthritis, right knee 05/25/2016  ? ?Conditions to be addressed/monitored per PCP order:  Chronic healthcare management needs, HTN, gout, tobacco use, osteoarthritis, h/o prostate cancer ? ?Care Plan : General Plan of Care (Adult)  ?Updates made by Gayla Medicus, RN since 05/31/2021 12:00 AM  ?  ? ?Problem: Health Promotion or Disease Self-Management (General Plan of Care)   ?Priority: Medium  ?Onset Date: 04/25/2020  ?  ? ?Long-Range Goal: Self-Management Plan Developed   ?  Start Date: 04/25/2020  ?Expected End Date: 08/31/2021  ?Recent Progress: On track  ?Priority: Medium  ?Note:   ? ?Current Barriers:   ?Knowledge Deficits related to short term plan for care coordination needs and long term plans for chronic disease management needs.  ?05/31/21:  No complaints today-checks blood pressure occasionally, smokes about 8 cigarettes a day-discussed methods for stopping-states he  will follow up with provider at next appt-7/5.  Blood pressures-122-148/81-95.  Does pelvic exercises maybe once a day to assist with urinary leakage-wears 1 pad a day-no change. ?Nurse Case Manager Clinical Goal(s):  ?patient will work with care management team to address care coordination and chronic disease management needs related to Disease Management ?Educational Needs ?Care Coordination ?Medication Management and Education ?Medication Reconciliation ?Medication Assistance  ?Psychosocial Support   ?Interventions:  ?Evaluation of current treatment plan and patient's adherence to plan as established by provider. ?Provided education to patient re: post surgical pain. ?Reviewed medications with patient.   ?.Collaborated with Pharmacy  regarding medications. ?Discussed plans with patient for ongoing care management follow up and provided patient with direct contact information for care management team ?Reviewed scheduled/upcoming provider appointments.  ?Pharmacy referral for medication review-Pharmacist following ?Collaborated with PCP for nicotine patch to stop smoking-completed. ?03/29/21:  Patient smoking 8 cigarettes a day. Has resources. ?Self Care Activities:  ?Patient will self administer medications as prescribed ?Patient will attend all scheduled provider appointments ?Patient will call pharmacy for medication refills ?Patient will call provider office for new concerns or questions ?Patient Goals: ?Over the next 30 days, patient will attend all provider appointments. ?Over the next 30 days, patient will start nicotine patch.-completed ?Over the next 30 days, patient will speak with Pharmacist regarding medications. ?Over the next 30 days, patient will check blood pressure on a regular basis. ?. ?Follow Up Plan: The care management team will reach out to the patient again over the next 30 days.   ? ?Follow Up:  Patient agrees to Care Plan and Follow-up. ? ?Plan: The Managed Medicaid care management team will  reach out to the patient again over the next 30 days. and The  Patient has been provided with contact information for the Managed Medicaid care management team and has been advised to call with any health related questions or concerns. ? ?Date/time of next scheduled RN care management/care coordination outreach:  06/29/21 at 1030 ?

## 2021-06-12 ENCOUNTER — Other Ambulatory Visit (INDEPENDENT_AMBULATORY_CARE_PROVIDER_SITE_OTHER): Payer: Self-pay

## 2021-06-12 MED ORDER — CHLORTHALIDONE 25 MG PO TABS: 25.0000 mg | ORAL_TABLET | Freq: Every day | ORAL | 0 refills | Status: DC

## 2021-06-29 ENCOUNTER — Ambulatory Visit: Payer: Medicaid Other

## 2021-07-24 ENCOUNTER — Ambulatory Visit (INDEPENDENT_AMBULATORY_CARE_PROVIDER_SITE_OTHER): Payer: Medicaid Other | Admitting: Primary Care

## 2021-07-26 ENCOUNTER — Ambulatory Visit (INDEPENDENT_AMBULATORY_CARE_PROVIDER_SITE_OTHER): Payer: Medicaid Other | Admitting: Primary Care

## 2021-07-26 ENCOUNTER — Other Ambulatory Visit: Payer: Self-pay | Admitting: Obstetrics and Gynecology

## 2021-07-26 NOTE — Patient Outreach (Signed)
Care Coordination  07/26/2021  Wayne [name] 31-Jul-1960 [id number]  RNCM called patient at scheduled time.  Patient answered phone and stated he forgot about the appointment.  He asked to be called another day.  RNCM rescheduled appointment at his request.  [name] RN, BSN [place] Management Coordinator - Managed [place] High Risk [phone]

## 2021-07-31 ENCOUNTER — Ambulatory Visit (INDEPENDENT_AMBULATORY_CARE_PROVIDER_SITE_OTHER): Payer: Medicaid Other | Admitting: Primary Care

## 2021-07-31 ENCOUNTER — Encounter (INDEPENDENT_AMBULATORY_CARE_PROVIDER_SITE_OTHER): Payer: Self-pay | Admitting: Primary Care

## 2021-07-31 ENCOUNTER — Telehealth (INDEPENDENT_AMBULATORY_CARE_PROVIDER_SITE_OTHER): Payer: Self-pay

## 2021-07-31 VITALS — BP 125/85 | HR 68 | Temp 97.9°F | Wt 247.0 lb

## 2021-07-31 DIAGNOSIS — I10 Essential (primary) hypertension: Secondary | ICD-10-CM

## 2021-07-31 DIAGNOSIS — Z760 Encounter for issue of repeat prescription: Secondary | ICD-10-CM | POA: Diagnosis not present

## 2021-07-31 DIAGNOSIS — G8929 Other chronic pain: Secondary | ICD-10-CM | POA: Diagnosis not present

## 2021-07-31 DIAGNOSIS — M25562 Pain in left knee: Secondary | ICD-10-CM | POA: Diagnosis not present

## 2021-07-31 DIAGNOSIS — Z8739 Personal history of other diseases of the musculoskeletal system and connective tissue: Secondary | ICD-10-CM | POA: Diagnosis not present

## 2021-07-31 DIAGNOSIS — M25561 Pain in right knee: Secondary | ICD-10-CM

## 2021-07-31 MED ORDER — ALLOPURINOL 300 MG PO TABS: 300.0000 mg | ORAL_TABLET | Freq: Every morning | ORAL | 3 refills | Status: DC

## 2021-07-31 MED ORDER — LIDOCAINE 3.5 % EX PTCH: 3.5000 "application " | MEDICATED_PATCH | Freq: Every day | CUTANEOUS | 1 refills | Status: DC

## 2021-07-31 MED ORDER — LOSARTAN POTASSIUM 100 MG PO TABS: ORAL_TABLET | ORAL | 1 refills | Status: DC

## 2021-07-31 MED ORDER — AMLODIPINE BESYLATE 10 MG PO TABS: 10.0000 mg | ORAL_TABLET | Freq: Every day | ORAL | 3 refills | Status: DC

## 2021-07-31 MED ORDER — ATORVASTATIN CALCIUM 40 MG PO TABS: 40.0000 mg | ORAL_TABLET | Freq: Every day | ORAL | 3 refills | Status: DC

## 2021-07-31 MED ORDER — BUPROPION HCL ER (SR) 150 MG PO TB12: 150.0000 mg | ORAL_TABLET | Freq: Two times a day (BID) | ORAL | 1 refills | Status: DC

## 2021-07-31 MED ORDER — CHLORTHALIDONE 25 MG PO TABS: 25.0000 mg | ORAL_TABLET | Freq: Every day | ORAL | 0 refills | Status: DC

## 2021-07-31 NOTE — Progress Notes (Unsigned)
[facility]   Wayne [name] is a 61 y.o. male presents for hypertension evaluation, Denies shortness of breath, headaches, chest pain or lower extremity edema, sudden onset, vision changes, unilateral weakness, dizziness, paresthesias   Patient {Actions; denies-reports:120008} adherence with medications.  Dietary habits include: *** Exercise habits include:*** Family / Social history: ***   Past Medical History:  Diagnosis Date   Arthritis    shoulders    Gout    High cholesterol    Hypertension    Prostate cancer [facility])    Past Surgical History:  Procedure Laterality Date   KNEE ARTHROSCOPY     LUMBAR DISC SURGERY     LYMPHADENECTOMY Bilateral 04/04/2020   Procedure: LYMPHADENECTOMY, PELVIC;  Surgeon: Raynelle Bring, MD;  Location: [facility];  Service: Urology;  Laterality: Bilateral;   ROBOT ASSISTED LAPAROSCOPIC RADICAL PROSTATECTOMY N/A 04/04/2020   Procedure: XI ROBOTIC ASSISTED LAPAROSCOPIC RADICAL PROSTATECTOMY LEVEL 2;  Surgeon: Raynelle Bring, MD;  Location: [facility];  Service: Urology;  Laterality: N/A;   Allergies  Allergen Reactions   Hydralazine Other (See Comments)    Headache   Current Outpatient Medications on File Prior to Visit  Medication Sig Dispense Refill   allopurinol (ZYLOPRIM) 300 MG tablet TAKE ONE TABLET BY MOUTH EVERY MORNING 90 tablet 3   amLODipine (NORVASC) 10 MG tablet TAKE ONE TABLET BY MOUTH ONCE DAILY 90 tablet 3   atorvastatin (LIPITOR) 40 MG tablet Take 1 tablet (40 mg total) by mouth daily. 90 tablet 3   buPROPion (WELLBUTRIN SR) 150 MG 12 hr tablet TAKE ONE TABLET BY MOUTH TWICE DAILY 180 tablet 1   chlorthalidone (HYGROTON) 25 MG tablet Take 1 tablet (25 mg total) by mouth daily. 90 tablet 0   ibuprofen (ADVIL) 800 MG tablet Take 1 tablet (800 mg total) by mouth every 8 (eight) hours as needed for moderate pain. 90 tablet 1   losartan (COZAAR) 100 MG tablet TAKE ONE TABLET BY MOUTH EVERY EVENING 90 tablet 1   tadalafil  (CIALIS) 10 MG tablet Take 1 tablet (10 mg total) by mouth every other day as needed for erectile dysfunction. 10 tablet 1   No current facility-administered medications on file prior to visit.   Social History   Socioeconomic History   Marital status: Married    Spouse name: Mazon   Number of children: 4   Years of education: Not on file   Highest education level: Not on file  Occupational History    Comment: patient does not work full time  Tobacco Use   Smoking status: Every Day    Packs/day: 0.50    Types: Cigarettes   Smokeless tobacco: Never   Tobacco comments:    Started smoking at the age of 1  Vaping Use   Vaping Use: Never used  Substance and Sexual Activity   Alcohol use: Yes    Alcohol/week: 14.0 standard drinks of alcohol    Types: 14 Cans of beer per week   Drug use: No   Sexual activity: Yes  Other Topics Concern   Not on file  Social History Narrative   Not on file   Social Determinants of Health   Financial Resource Strain: Low Risk  (03/29/2021)   Overall Financial Resource Strain (CARDIA)    Difficulty of Paying Living Expenses: Not hard at all  Food Insecurity: No Food Insecurity (03/29/2021)   Hunger Vital Sign    Worried About Running Out of Food in the Last Year: Never true  Ran Out of Food in the Last Year: Never true  Transportation Needs: No Transportation Needs (07/26/2021)   PRAPARE - Hydrologist (Medical): No    Lack of Transportation (Non-Medical): No  Physical Activity: Insufficiently Active (04/25/2021)   Exercise Vital Sign    Days of Exercise per Week: 7 days    Minutes of Exercise per Session: 10 min  Stress: No Stress Concern Present (05/31/2021)   [place]    Feeling of Stress : Not at all  Social Connections: Moderately Integrated (07/26/2021)   Social Connection and Isolation Panel [NHANES]    Frequency of Communication with Friends  and Family: More than three times a week    Frequency of Social Gatherings with Friends and Family: More than three times a week    Attends Religious Services: 1 to 4 times per year    Active Member of Clubs or Organizations: No    Attends Archivist Meetings: Never    Marital Status: Married  Human resources officer Violence: Not At Risk (03/01/2021)   Humiliation, Afraid, Rape, and Kick questionnaire    Fear of Current or Ex-Partner: No    Emotionally Abused: No    Physically Abused: No    Sexually Abused: No   Family History  Problem Relation Age of Onset   Colon cancer Mother    Pancreatic cancer Maternal Uncle    Colon polyps Neg Hx    Esophageal cancer Neg Hx    Rectal cancer Neg Hx      OBJECTIVE:  Vitals:   07/31/21 0953  BP: 125/85  Pulse: 68  Temp: 97.9 F (36.6 C)  TempSrc: Oral  SpO2: 96%  Weight: 247 lb (112 kg)    Physical Exam   ROS  Last 3 Office BP readings: BP Readings from Last 3 Encounters:  07/31/21 125/85  03/13/21 120/84  12/09/20 (!) 144/89    BMET    Component Value Date/Time   NA 141 03/30/2020 1041   NA 141 06/10/2019 1045   K 3.6 03/30/2020 1041   CL 106 03/30/2020 1041   CO2 21 (L) 03/30/2020 1041   GLUCOSE 105 (H) 03/30/2020 1041   BUN 12 03/30/2020 1041   BUN 14 06/10/2019 1045   CREATININE 0.85 03/30/2020 1041   CALCIUM 9.4 03/30/2020 1041   GFRNONAA >60 03/30/2020 1041   GFRAA 82 06/10/2019 1045    Renal function: CrCl cannot be calculated (Patient's most recent lab result is older than the maximum 21 days allowed.).  Clinical ASCVD: {YES/NO:21197} The 10-year ASCVD risk score (Arnett DK, et al., 2019) is: 21.1%   Values used to calculate the score:     Age: 60 years     Sex: Male     Is Non-Hispanic African American: Yes     Diabetic: No     Tobacco smoker: Yes     Systolic Blood Pressure: 710 mmHg     Is BP treated: Yes     HDL Cholesterol: 37 mg/dL     Total Cholesterol: 146 mg/dL  ASCVD risk factors  include- [name]   ASSESSMENT & PLAN:  No diagnosis found.  No orders of the defined types were placed in this encounter.   -Counseled on lifestyle modifications for blood pressure control including reduced dietary sodium, increased exercise, weight reduction and adequate sleep. Also, educated patient about the risk for cardiovascular events, stroke and heart attack. Also counseled patient about the importance  of medication adherence. If you participate in smoking, it is important to stop using tobacco as this will increase the risks associated with uncontrolled blood pressure.   -Hypertension longstanding/newly diagnosed currently *** on current medications. Patient {Is/is not:9024} adherent with current medications.   Goal BP:  For patients younger than 60: Goal BP < 130/80. For patients 60 and older: Goal BP < 140/90. For patients with diabetes: Goal BP < 130/80. Your most recent BP: ***  Minimize salt intake. Minimize alcohol intake    This note has been created with Surveyor, quantity. Any transcriptional errors are unintentional.   [name], NP 07/31/2021, 9:56 AM

## 2021-07-31 NOTE — Telephone Encounter (Signed)
Copied from [place]. Topic: General - Other >> Jul 31, 2021 11:01 AM Leitha Schuller wrote: Reason for CRM: [email or web address] states they do not have Lidocaine 3.5 % PTCH in stock and inquiring if they can swith to Lidocaine 5 % PTCH instead  Please advise and fu w/ pharmacy

## 2021-08-01 ENCOUNTER — Other Ambulatory Visit (INDEPENDENT_AMBULATORY_CARE_PROVIDER_SITE_OTHER): Payer: Self-pay | Admitting: Primary Care

## 2021-08-01 DIAGNOSIS — G8929 Other chronic pain: Secondary | ICD-10-CM

## 2021-08-01 MED ORDER — LIDOCAINE 5 % EX PTCH
1.0000 | MEDICATED_PATCH | CUTANEOUS | 1 refills | Status: DC
Start: 2021-08-01 — End: 2023-03-09

## 2021-08-01 NOTE — Telephone Encounter (Signed)
Medication changed per patient request

## 2021-08-22 ENCOUNTER — Ambulatory Visit (INDEPENDENT_AMBULATORY_CARE_PROVIDER_SITE_OTHER): Payer: Medicaid Other | Admitting: Orthopaedic Surgery

## 2021-08-22 DIAGNOSIS — M1712 Unilateral primary osteoarthritis, left knee: Secondary | ICD-10-CM

## 2021-08-22 DIAGNOSIS — M1711 Unilateral primary osteoarthritis, right knee: Secondary | ICD-10-CM

## 2021-08-22 MED ORDER — LIDOCAINE HCL 1 % IJ SOLN
2.0000 mL | INTRAMUSCULAR | Status: AC | PRN
  Administered 2021-08-22: 2 mL

## 2021-08-22 MED ORDER — BUPIVACAINE HCL 0.5 % IJ SOLN
2.0000 mL | INTRAMUSCULAR | Status: AC | PRN
  Administered 2021-08-22: 2 mL via INTRA_ARTICULAR

## 2021-08-22 MED ORDER — METHYLPREDNISOLONE ACETATE 40 MG/ML IJ SUSP
40.0000 mg | INTRAMUSCULAR | Status: AC | PRN
  Administered 2021-08-22: 40 mg via INTRA_ARTICULAR

## 2021-08-22 NOTE — Progress Notes (Signed)
Office Visit Note   Patient: Wayne [name]           Date of Birth: [birth year]           MRN: [id number] Visit Date: 08/22/2021              Requested by: [name], NP [address] [place],  [place] [address] PCP: [name], NP   Assessment & Plan: Visit Diagnoses:  1. Primary osteoarthritis of left knee   2. Primary osteoarthritis of right knee     Plan: I impression is advanced bilateral knee DJD.  Treatment options were explained again and he opted to undergo bilateral cortisone injections today.  We had a long discussion about knee replacement surgery.  He will call us back when he is ready to schedule.  Denies nickel allergy or history of DVT.  Risk benefits prognosis of knee replacement reviewed today.  Follow-Up Instructions: No follow-ups on file.   Orders:  No orders of the defined types were placed in this encounter.  No orders of the defined types were placed in this encounter.     Procedures: Large Joint Inj: bilateral knee on 08/22/2021 2:20 PM Indications: pain Details: 22 G needle  Arthrogram: No  Medications (Right): 2 mL lidocaine 1 %; 2 mL bupivacaine 0.5 %; 40 mg methylPREDNISolone acetate 40 MG/ML Medications (Left): 2 mL lidocaine 1 %; 2 mL bupivacaine 0.5 %; 40 mg methylPREDNISolone acetate 40 MG/ML Outcome: tolerated well, no immediate complications Patient was prepped and draped in the usual sterile fashion.       Clinical Data: No additional findings.   Subjective: No chief complaint on file.   HPI [name] comes back for bilateral knee pain due to severe DJD.  Continues to have problems with ADLs and quality of life.  Review of Systems  Constitutional: Negative.   All other systems reviewed and are negative.    Objective: Vital Signs: There were no vitals taken for this visit.  Physical Exam Vitals and nursing note reviewed.  Constitutional:      Appearance: He is well-developed.  HENT:      Head: Normocephalic and atraumatic.  Eyes:     Pupils: Pupils are equal, round, and reactive to light.  Pulmonary:     Effort: Pulmonary effort is normal.  Abdominal:     Palpations: Abdomen is soft.  Musculoskeletal:        General: Normal range of motion.     Cervical back: Neck supple.  Skin:    General: Skin is warm.  Neurological:     Mental Status: He is alert and oriented to person, place, and time.  Psychiatric:        Behavior: Behavior normal.        Thought Content: Thought content normal.        Judgment: Judgment normal.     Ortho Exam Examination of bilateral knees is unchanged. Specialty Comments:  No specialty comments available.  Imaging: No results found.   PMFS History: Patient Active Problem List   Diagnosis Date Noted   Primary osteoarthritis of left knee 08/22/2021   Primary osteoarthritis of right knee 08/22/2021   Prostate cancer (Wayne [name]) 04/04/2020   Malignant neoplasm of prostate ([place]) 01/17/2020   Acute gout due to renal impairment involving hand 08/12/2017   Essential hypertension 08/12/2017   Unilateral primary osteoarthritis, left knee 05/25/2016   Unilateral primary osteoarthritis, right knee 05/25/2016   Past Medical History:  Diagnosis Date  Arthritis    shoulders    Gout    High cholesterol    Hypertension    Prostate cancer (Wayne [name])     Family History  Problem Relation Age of Onset   Colon cancer Mother    Pancreatic cancer Maternal Uncle    Colon polyps Neg Hx    Esophageal cancer Neg Hx    Rectal cancer Neg Hx     Past Surgical History:  Procedure Laterality Date   KNEE ARTHROSCOPY     LUMBAR DISC SURGERY     LYMPHADENECTOMY Bilateral 04/04/2020   Procedure: LYMPHADENECTOMY, PELVIC;  Surgeon: Raynelle Bring, MD;  Location: [facility];  Service: Urology;  Laterality: Bilateral;   ROBOT ASSISTED LAPAROSCOPIC RADICAL PROSTATECTOMY N/A 04/04/2020   Procedure: XI ROBOTIC ASSISTED LAPAROSCOPIC RADICAL PROSTATECTOMY LEVEL 2;   Surgeon: Raynelle Bring, MD;  Location: [facility];  Service: Urology;  Laterality: N/A;   Social History   Occupational History    Comment: patient does not work full time  Tobacco Use   Smoking status: Every Day    Packs/day: 0.50    Types: Cigarettes   Smokeless tobacco: Never   Tobacco comments:    Started smoking at the age of 16  Vaping Use   Vaping Use: Never used  Substance and Sexual Activity   Alcohol use: Yes    Alcohol/week: 14.0 standard drinks of alcohol    Types: 14 Cans of beer per week   Drug use: No   Sexual activity: Yes

## 2021-08-30 ENCOUNTER — Other Ambulatory Visit: Payer: Self-pay | Admitting: Obstetrics and Gynecology

## 2021-08-30 NOTE — Patient Outreach (Signed)
Medicaid Managed Care   Nurse Care Manager Note  08/30/2021 Name:  Wayne [name] MRN:  [id number] DOB:  [birth year]  Wayne [name] is an 61 y.o. year old male who is a primary patient of [name], NP.  The [facility] Managed Care Coordination team was consulted for assistance with:    Chronic healthcare management needs, gout, HTN, tobacco use, osteoarthritis, h/o prostate cancer  Wayne [name] was given information about Medicaid Managed Care Coordination team services today. Wayne [name] Patient agreed to services and verbal consent obtained.  Engaged with patient by telephone for follow up visit in response to provider referral for case management and/or care coordination services.   Assessments/Interventions:  Review of past medical history, allergies, medications, health status, including review of consultants reports, laboratory and other test data, was performed as part of comprehensive evaluation and provision of chronic care management services.  SDOH (Social Determinants of Health) assessments and interventions performed: SDOH Interventions    Flowsheet Row Most Recent Value  SDOH Interventions   Food Insecurity Interventions Intervention Not Indicated      Care Plan  Allergies  Allergen Reactions   Hydralazine Other (See Comments)    Headache   Medications Reviewed Today     Reviewed by Gayla Medicus, RN (Registered Nurse) on 08/30/21 at 57  Med List Status: <None>   Medication Order Taking? Sig Documenting Provider Last Dose Status Informant  allopurinol (ZYLOPRIM) 300 MG tablet [id number]  Take 1 tablet (300 mg total) by mouth every morning. [name], NP  Active   amLODipine (NORVASC) 10 MG tablet [id number]  Take 1 tablet (10 mg total) by mouth daily. [name], NP  Active   atorvastatin (LIPITOR) 40 MG tablet [id number]  Take 1 tablet (40 mg total) by mouth daily. [name], NP  Active   chlorthalidone (HYGROTON) 25 MG  tablet [id number]  Take 1 tablet (25 mg total) by mouth daily. [name], NP  Active   ibuprofen (ADVIL) 800 MG tablet [id number]  Take 1 tablet (800 mg total) by mouth every 8 (eight) hours as needed for moderate pain. [name], NP  Active   lidocaine (LIDODERM) 5 % [id number]  Place 1 patch onto the skin daily. Remove & Discard patch within 12 hours or as directed by MD [name], NP  Active   losartan (COZAAR) 100 MG tablet [id number]  TAKE ONE TABLET BY MOUTH EVERY EVENING [name], NP  Active   tadalafil (CIALIS) 10 MG tablet [id number] No Take 1 tablet (10 mg total) by mouth every other day as needed for erectile dysfunction. [name], NP Taking Active   Med List Note [name], NP 06/27/20 1021):               Patient Active Problem List   Diagnosis Date Noted   Primary osteoarthritis of left knee 08/22/2021   Primary osteoarthritis of right knee 08/22/2021   Prostate cancer ([place]) 04/04/2020   Malignant neoplasm of prostate ([place]) 01/17/2020   Acute gout due to renal impairment involving hand 08/12/2017   Essential hypertension 08/12/2017   Unilateral primary osteoarthritis, left knee 05/25/2016   Unilateral primary osteoarthritis, right knee 05/25/2016   Conditions to be addressed/monitored per PCP order:  Chronic healthcare management needs, gout, HTN, tobacco use, osteoarthritis, h/o prostate cancer  Care Plan : General Plan of Care (Adult)  Updates made by Gayla Medicus, RN since 08/30/2021 12:00 AM  Problem: Health Promotion or Disease Self-Management (General Plan of Care)   Priority: Medium  Onset Date: 04/25/2020     Long-Range Goal: Self-Management Plan Developed   Start Date: 04/25/2020  Expected End Date: 11/30/2021  Recent Progress: On track  Priority: Medium  Note:    Current Barriers:   Knowledge Deficits related to short term plan for care coordination needs and long term plans for chronic  disease management needs.  08/30/21:  Patient with ongoing knee pain-right greater than left, received injections 8/1, considering knee replacement, rates pain 9 out of 10 when really bad-takes IBP and wears brace.  BP WNL-continues to smoke 8 cigarettes a day-trying to stop-offered resources. Nurse Case Manager Clinical Goal(s):  patient will work with care management team to address care coordination and chronic disease management needs related to Disease Management Educational Needs Care Coordination Medication Management and Education Medication Reconciliation Medication Assistance  Psychosocial Support   Interventions:  Evaluation of current treatment plan and patient's adherence to plan as established by provider. Provided education to patient re: post surgical pain. Reviewed medications with patient.   [name] with Pharmacy  regarding medications. Discussed plans with patient for ongoing care management follow up and provided patient with direct contact information for care management team Reviewed scheduled/upcoming provider appointments.  Pharmacy referral for medication review-Pharmacist following Collaborated with PCP for nicotine patch to stop smoking-completed. 03/29/21:  Patient smoking 8 cigarettes a day. Has resources. Self Care Activities:  Patient will self administer medications as prescribed Patient will attend all scheduled provider appointments Patient will call pharmacy for medication refills Patient will call provider office for new concerns or questions Patient Goals: Over the next 30 days, patient will attend all provider appointments. Over the next 30 days, patient will start nicotine patch.-completed Over the next 30 days, patient will speak with Pharmacist regarding medications. Over the next 30 days, patient will check blood pressure on a regular basis. . Follow Up Plan: The care management team will reach out to the patient again over the next 30  business  days.    Follow Up:  Patient agrees to Care Plan and Follow-up.  Plan: The Managed Medicaid care management team will reach out to the patient again over the next 30 business  days. and The  Patient has been provided with contact information for the Managed Medicaid care management team and has been advised to call with any health related questions or concerns.  Date/time of next scheduled RN care management/care coordination outreach:  10/09/21 at 1230.

## 2021-08-30 NOTE — Patient Instructions (Signed)
Hey Wayne [name], thanks for speaking to me today-have a wonderful day!!  Wayne [name] was given information about Medicaid Managed Care team care coordination services as a part of their [place] Medicaid benefit. Wayne [name] verbally consented to engagement with the [facility] Managed Care team.   If you are experiencing a medical emergency, please call 911 or report to your local emergency department or urgent care.   If you have a non-emergency medical problem during routine business hours, please contact your provider's office and ask to speak with a nurse.   For questions related to your [facility], please call: [phone] or visit the homepage here: [email or web address]  If you would like to schedule transportation through your [facility], please call the following number at least 2 days in advance of your appointment: [phone]   Rides for urgent appointments can also be made after hours by calling Member Services.  Call the [place] at [phone], at any time, 24 hours a day, 7 days a week. If you are in danger or need immediate medical attention call 911.  If you would like help to quit smoking, call 1-800-QUIT-NOW 518 165 9872) OR Espaol: 1-855-Djelo-Ya (8-144-818-5631) o para ms informacin haga clic aqu or Text READY to 200-400 to register via text  Wayne [name] - following are the goals we discussed in your visit today:   Goals Addressed             This Visit's Progress    Disease Progression Prevented or Minimized       08/30/21:  Patient recently seen and evaluated by PCP and ORTHO.   Patient verbalizes understanding of instructions and care plan provided today and agrees to view in [place]. Active MyChart status and patient understanding of how to access instructions and care plan via MyChart confirmed with  patient.     The Managed Medicaid care management team will reach out to the patient again over the next 30 business  days.  The  Patient has been provided with contact information for the Managed Medicaid care management team and has been advised to call with any health related questions or concerns.   [name] RN, BSN [place]  Triad Curator - Managed Medicaid High Risk [phone].   Following is a copy of your plan of care:  Care Plan : General Plan of Care (Adult)  Updates made by Gayla Medicus, RN since 08/30/2021 12:00 AM     Problem: Health Promotion or Disease Self-Management (General Plan of Care)   Priority: Medium  Onset Date: 04/25/2020     Long-Range Goal: Self-Management Plan Developed   Start Date: 04/25/2020  Expected End Date: 11/30/2021  Recent Progress: On track  Priority: Medium  Note:    Current Barriers:   Knowledge Deficits related to short term plan for care coordination needs and long term plans for chronic disease management needs.  08/30/21:  Patient with ongoing knee pain-right greater than left, received injections 8/1, considering knee replacement, rates pain 9 out of 10 when really bad-takes IBP and wears brace.  BP WNL-continues to smoke 8 cigarettes a day-trying to stop-offered resources. Nurse Case Manager Clinical Goal(s):  patient will work with care management team to address care coordination and chronic disease management needs related to Disease Management Educational Needs Care Coordination Medication Management and Education Medication Reconciliation Medication Assistance  Psychosocial Support   Interventions:  Evaluation of current  treatment plan and patient's adherence to plan as established by provider. Provided education to patient re: post surgical pain. Reviewed medications with patient.   [name] with Pharmacy  regarding medications. Discussed plans with patient for ongoing care  management follow up and provided patient with direct contact information for care management team Reviewed scheduled/upcoming provider appointments.  Pharmacy referral for medication review-Pharmacist following Collaborated with PCP for nicotine patch to stop smoking-completed. 03/29/21:  Patient smoking 8 cigarettes a day. Has resources. Self Care Activities:  Patient will self administer medications as prescribed Patient will attend all scheduled provider appointments Patient will call pharmacy for medication refills Patient will call provider office for new concerns or questions Patient Goals: Over the next 30 days, patient will attend all provider appointments. Over the next 30 days, patient will start nicotine patch.-completed Over the next 30 days, patient will speak with Pharmacist regarding medications. Over the next 30 days, patient will check blood pressure on a regular basis. . Follow Up Plan: The care management team will reach out to the patient again over the next 30 business  days.

## 2021-10-09 ENCOUNTER — Other Ambulatory Visit: Payer: Self-pay | Admitting: Obstetrics and Gynecology

## 2021-10-09 NOTE — Patient Outreach (Signed)
Medicaid Managed Care   Nurse Care Manager Note  10/09/2021 Name:  Wayne [name] MRN:  [id number] DOB:  1960/06/27  Wayne [name] is an 61 y.o. year old male who is a primary patient of [name], NP.  The [facility] Managed Care Coordination team was consulted for assistance with:    Chronic healthcare management needs, gout, HTN, tobacco use, osteoarthritis, h/o prostate cancer  Wayne [name] was given information about Medicaid Managed Care Coordination team services today. Wayne [name] Patient agreed to services and verbal consent obtained.  Engaged with patient by telephone for follow up visit in response to provider referral for case management and/or care coordination services.   Assessments/Interventions:  Review of past medical history, allergies, medications, health status, including review of consultants reports, laboratory and other test data, was performed as part of comprehensive evaluation and provision of chronic care management services.  SDOH (Social Determinants of Health) assessments and interventions performed: SDOH Interventions    Flowsheet Row Patient Outreach Telephone from 10/09/2021 in [place] Patient Outreach Telephone from 08/30/2021 in [place] Patient Outreach Telephone from 05/31/2021 in [place] Patient Outreach Telephone from 04/25/2021 in [place] Patient Outreach Telephone from 03/29/2021 in [place] Patient Outreach Telephone from 03/01/2021 in [place] Coordination  SDOH Interventions        Food Insecurity Interventions -- Intervention Not Indicated -- -- Intervention Not Indicated --  Housing Interventions Intervention Not Indicated -- -- Intervention Not Indicated -- --  Transportation Interventions -- --  -- -- -- Intervention Not Indicated  Utilities Interventions Intervention Not Indicated -- -- -- -- --  Financial Strain Interventions -- -- -- -- Intervention Not Indicated --  Physical Activity Interventions -- -- -- Intervention Not Indicated  [walks as he is able with his knees] -- --  Stress Interventions -- -- Intervention Not Indicated -- -- --     Care Plan  Allergies  Allergen Reactions   Hydralazine Other (See Comments)    Headache    Medications Reviewed Today     Reviewed by Gayla Medicus, RN (Registered Nurse) on 10/09/21 at 1034  Med List Status: <None>   Medication Order Taking? Sig Documenting Provider Last Dose Status Informant  allopurinol (ZYLOPRIM) 300 MG tablet [id number]  Take 1 tablet (300 mg total) by mouth every morning. [name], NP  Active   amLODipine (NORVASC) 10 MG tablet [id number]  Take 1 tablet (10 mg total) by mouth daily. [name], NP  Active   atorvastatin (LIPITOR) 40 MG tablet [id number]  Take 1 tablet (40 mg total) by mouth daily. [name], NP  Active   chlorthalidone (HYGROTON) 25 MG tablet [id number]  Take 1 tablet (25 mg total) by mouth daily. [name], NP  Active   ibuprofen (ADVIL) 800 MG tablet [id number]  Take 1 tablet (800 mg total) by mouth every 8 (eight) hours as needed for moderate pain. [name], NP  Active   lidocaine (LIDODERM) 5 % [id number]  Place 1 patch onto the skin daily. Remove & Discard patch within 12 hours or as directed by MD [name], NP  Active   losartan (COZAAR) 100 MG tablet [id number]  TAKE ONE TABLET BY MOUTH EVERY Laurey Morale, NP  Active   tadalafil (CIALIS) 10 MG tablet [id number] No Take 1  tablet (10 mg total) by mouth every other day as needed for erectile dysfunction. [name], NP Taking Active   Med List Note [name], NP 06/27/20 1021):               Patient Active Problem List   Diagnosis Date Noted    Primary osteoarthritis of left knee 08/22/2021   Primary osteoarthritis of right knee 08/22/2021   Prostate cancer ([place]) 04/04/2020   Malignant neoplasm of prostate ([place]) 01/17/2020   Acute gout due to renal impairment involving hand 08/12/2017   Essential hypertension 08/12/2017   Unilateral primary osteoarthritis, left knee 05/25/2016   Unilateral primary osteoarthritis, right knee 05/25/2016   Conditions to be addressed/monitored per PCP order:  Chronic healthcare management needs, gout, HTN, tobacco use, osteoarthritis, h/o prostate cancer  Care Plan : General Plan of Care (Adult)  Updates made by Gayla Medicus, RN since 10/09/2021 12:00 AM     Problem: Health Promotion or Disease Self-Management (General Plan of Care)   Priority: Medium  Onset Date: 04/25/2020     Long-Range Goal: Self-Management Plan Developed   Start Date: 04/25/2020  Expected End Date: 11/30/2021  Recent Progress: On track  Priority: Medium  Note:    Current Barriers:   Knowledge Deficits related to short term plan for care coordination needs and long term plans for chronic disease management needs.  10/09/21:  Patient with no complaints today, BP stable.  Uses knee brace and IBP for knee pain as needed.  No change in smoking-8 cigarettes a day. Nurse Case Manager Clinical Goal(s):  patient will work with care management team to address care coordination and chronic disease management needs related to Disease Management Educational Needs Care Coordination Medication Management and Education Medication Reconciliation Medication Assistance  Psychosocial Support   Interventions:  Evaluation of current treatment plan and patient's adherence to plan as established by provider. Provided education to patient re: post surgical pain. Reviewed medications with patient.   [name] with Pharmacy  regarding medications. Discussed plans with patient for ongoing care management follow up and provided patient with  direct contact information for care management team Reviewed scheduled/upcoming provider appointments.  Pharmacy referral for medication review-Pharmacist following Collaborated with PCP for nicotine patch to stop smoking-completed. 03/29/21:  Patient smoking 8 cigarettes a day. Has resources. Self Care Activities:  Patient will self administer medications as prescribed Patient will attend all scheduled provider appointments Patient will call pharmacy for medication refills Patient will call provider office for new concerns or questions Patient Goals: Over the next 30 days, patient will attend all provider appointments. Over the next 30 days, patient will start nicotine patch.-completed Over the next 30 days, patient will speak with Pharmacist regarding medications. Over the next 30 days, patient will check blood pressure on a regular basis. . Follow Up Plan: The care management team will reach out to the patient again over the next 30 business  days.    Follow Up:  Patient agrees to Care Plan and Follow-up.  Plan: The Managed Medicaid care management team will reach out to the patient again over the next 30 business  days. and The  Patient has been provided with contact information for the Managed Medicaid care management team and has been advised to call with any health related questions or concerns.  Date/time of next scheduled RN care management/care coordination outreach: 11/10/21 at 230.

## 2021-10-09 NOTE — Patient Instructions (Signed)
Hi Wayne [name], thank you for speaking with me this morning, I hope you have a great day!  Wayne [name] was given information about Medicaid Managed Care team care coordination services as a part of their [place] Medicaid benefit. Wayne [name] verbally consented to engagement with the [facility] Managed Care team.   If you are experiencing a medical emergency, please call 911 or report to your local emergency department or urgent care.   If you have a non-emergency medical problem during routine business hours, please contact your provider's office and ask to speak with a nurse.   For questions related to your [facility], please call: [phone] or visit the homepage here: [email or web address]  If you would like to schedule transportation through your [facility], please call the following number at least 2 days in advance of your appointment: [phone]   Rides for urgent appointments can also be made after hours by calling Member Services.  Call the [place] at [phone], at any time, 24 hours a day, 7 days a week. If you are in danger or need immediate medical attention call 911.  If you would like help to quit smoking, call 1-800-QUIT-NOW 208-314-6895) OR Espaol: 1-855-Djelo-Ya (3-329-518-8416) o para ms informacin haga clic aqu or Text READY to 200-400 to register via text  Wayne [name] - following are the goals we discussed in your visit today:   Goals Addressed             This Visit's Progress    Disease Progression Prevented or Minimized       10/09/21:  F/U with [name] next month.   Patient verbalizes understanding of instructions and care plan provided today and agrees to view in [place]. Active MyChart status and patient understanding of how to access instructions and care plan via MyChart confirmed with patient.      The Managed Medicaid care management team will reach out to the patient again over the next 30 business  days.  The  Patient  has been provided with contact information for the Managed Medicaid care management team and has been advised to call with any health related questions or concerns.   Wayne Raider RN, BSN [place]  [facility] Curator - Managed Medicaid High Risk [phone].   Following is a copy of your plan of care:  Care Plan : General Plan of Care (Adult)  Updates made by Wayne Medicus, RN since 10/09/2021 12:00 AM     Problem: Health Promotion or Disease Self-Management (General Plan of Care)   Priority: Medium  Onset Date: 04/25/2020     Long-Range Goal: Self-Management Plan Developed   Start Date: 04/25/2020  Expected End Date: 11/30/2021  Recent Progress: On track  Priority: Medium  Note:    Current Barriers:   Knowledge Deficits related to short term plan for care coordination needs and long term plans for chronic disease management needs.  10/09/21:  Patient with no complaints today, BP stable.  Uses knee brace and IBP for knee pain as needed.  No change in smoking-8 cigarettes a day. Nurse Case Manager Clinical Goal(s):  patient will work with care management team to address care coordination and chronic disease management needs related to Disease Management Educational Needs Care Coordination Medication Management and Education Medication Reconciliation Medication Assistance  Psychosocial Support   Interventions:  Evaluation of current treatment plan and patient's adherence to plan as established  by provider. Provided education to patient re: post surgical pain. Reviewed medications with patient.   Wayne [name] with Pharmacy  regarding medications. Discussed plans with patient for ongoing care management follow up and provided patient with direct contact information for care management team Reviewed scheduled/upcoming  provider appointments.  Pharmacy referral for medication review-Pharmacist following Collaborated with PCP for nicotine patch to stop smoking-completed. 03/29/21:  Patient smoking 8 cigarettes a day. Has resources. Self Care Activities:  Patient will self administer medications as prescribed Patient will attend all scheduled provider appointments Patient will call pharmacy for medication refills Patient will call provider office for new concerns or questions Patient Goals: Over the next 30 days, patient will attend all provider appointments. Over the next 30 days, patient will start nicotine patch.-completed Over the next 30 days, patient will speak with Pharmacist regarding medications. Over the next 30 days, patient will check blood pressure on a regular basis. . Follow Up Plan: The care management team will reach out to the patient again over the next 30 business  days.

## 2021-11-10 ENCOUNTER — Other Ambulatory Visit: Payer: Self-pay | Admitting: Obstetrics and Gynecology

## 2021-11-10 NOTE — Patient Instructions (Signed)
Hey Mr. Uselman to speak with you today-don't forget to follow up with [name] about the incontinence supplies-have a great weekend!!  Mr. Vandeusen was given information about Medicaid Managed Care team care coordination services as a part of their [place] Medicaid benefit. [name] verbally consented to engagement with the [facility] Managed Care team.   If you are experiencing a medical emergency, please call 911 or report to your local emergency department or urgent care.   If you have a non-emergency medical problem during routine business hours, please contact your provider's office and ask to speak with a nurse.   For questions related to your [facility], please call: [phone] or visit the homepage here: [email or web address]  If you would like to schedule transportation through your [facility], please call the following number at least 2 days in advance of your appointment: [phone]   Rides for urgent appointments can also be made after hours by calling Member Services.  Call the [place] at [phone], at any time, 24 hours a day, 7 days a week. If you are in danger or need immediate medical attention call 911.  If you would like help to quit smoking, call 1-800-QUIT-NOW 613-207-9642) OR Espaol: 1-855-Djelo-Ya (5-993-570-1779) o para ms informacin haga clic aqu or Text READY to 200-400 to register via text  Mr. Guillet - following are the goals we discussed in your visit today:   Goals Addressed             This Visit's Progress    Disease Progression Prevented or Minimized       11/10/21:  Appointment with [name] 11/28/21   Patient verbalizes understanding of instructions and care plan provided today and agrees to view in [place]. Active MyChart status and patient understanding of how to access  instructions and care plan via MyChart confirmed with patient.     The Managed Medicaid care management team will reach out to the patient again over the next 30 business  days.  The  Patient has been provided with contact information for the Managed Medicaid care management team and has been advised to call with any health related questions or concerns.   [name] RN, BSN [place] Management Coordinator - Managed Medicaid High Risk [phone]   Following is a copy of your plan of care:  Care Plan : General Plan of Care (Adult)  Updates made by Gayla Medicus, RN since 11/10/2021 12:00 AM     Problem: Health Promotion or Disease Self-Management (General Plan of Care)   Priority: Medium  Onset Date: 04/25/2020     Long-Range Goal: Self-Management Plan Developed   Start Date: 04/25/2020  Expected End Date: 02/10/2022  Recent Progress: On track  Priority: Medium  Note:    Current Barriers:   Knowledge Deficits related to short term plan for care coordination needs and long term plans for chronic disease management needs.  11/10/21:  No complaints today-to inquire with Dr. Lynne Logan  office regarding order for incontinence pads-urinary leakage continues although less, doing pelvic exercises.  BP WNL.  Uses knee brace daily and no change in smoking. Nurse Case Manager Clinical Goal(s):  patient will work with care management team to address care coordination and chronic disease management needs related to Disease Management Educational Needs Care Coordination Medication Management and Education Medication Reconciliation Medication Assistance  Psychosocial Support   Interventions:  Evaluation of  current treatment plan and patient's adherence to plan as established by provider. Provided education to patient re: post surgical pain. Reviewed medications with patient.   [name] with Pharmacy  regarding medications. Discussed plans with patient  for ongoing care management follow up and provided patient with direct contact information for care management team Reviewed scheduled/upcoming provider appointments.  Pharmacy referral for medication review-Pharmacist following Collaborated with PCP for nicotine patch to stop smoking-completed. 03/29/21:  Patient smoking 8 cigarettes a day. Has resources. Self Care Activities:  Patient will self administer medications as prescribed Patient will attend all scheduled provider appointments Patient will call pharmacy for medication refills Patient will call provider office for new concerns or questions Patient Goals: Over the next 30 days, patient will attend all provider appointments. Over the next 30 days, patient will start nicotine patch.-completed Over the next 30 days, patient will speak with Pharmacist regarding medications. Over the next 30 days, patient will check blood pressure on a regular basis. . Follow Up Plan: The care management team will reach out to the patient again over the next 30 business  days.

## 2021-11-10 NOTE — Patient Outreach (Signed)
Medicaid Managed Care   Nurse Care Manager Note  11/10/2021 Name:  Wayne [name] MRN:  [id number] DOB:  1960/06/14  Wayne [name] is an 61 y.o. year old male who is a primary patient of Wayne Perna, NP.  The [name] for assistance with:    Chronic healthcare management needs, gout, HTN, h/o prostate Ca, tobacco use, osteoarthritis.  Wayne [name] was given information about Medicaid Managed Care Coordination team services today. Wayne [name] Patient agreed to services and verbal consent obtained.  Engaged with patient by telephone for follow up visit in response to provider referral for case management and/or care coordination services.   Assessments/Interventions:  Review of past medical history, allergies, medications, health status, including review of consultants reports, laboratory and other test data, was performed as part of comprehensive evaluation and provision of chronic care management services.  SDOH (Social Determinants of Health) assessments and interventions performed: SDOH Interventions    Flowsheet Row Patient Outreach Telephone from 11/10/2021 in [place] Patient Outreach Telephone from 10/09/2021 in [place] Patient Outreach Telephone from 08/30/2021 in [place] Patient Outreach Telephone from 05/31/2021 in [place] Patient Outreach Telephone from 04/25/2021 in [place] Patient Outreach Telephone from 03/29/2021 in [place] Coordination  SDOH Interventions        Food Insecurity Interventions -- -- Intervention Not Indicated -- -- Intervention Not Indicated  Housing Interventions -- Intervention Not Indicated -- -- Intervention Not Indicated --  Utilities Interventions --  Intervention Not Indicated -- -- -- --  Financial Strain Interventions Intervention Not Indicated -- -- -- -- Intervention Not Indicated  Physical Activity Interventions Intervention Not Indicated  [knee pain, gout, osteoarthritis limits his activity] -- -- -- Intervention Not Indicated  [walks as he is able with his knees] --  Stress Interventions -- -- -- Intervention Not Indicated -- --     Care Plan  Allergies  Allergen Reactions   Hydralazine Other (See Comments)    Headache    Medications Reviewed Today     Reviewed by Wayne Medicus, RN (Registered Nurse) on 11/10/21 at 1447  Med List Status: <None>   Medication Order Taking? Sig Documenting Provider Last Dose Status Informant  allopurinol (ZYLOPRIM) 300 MG tablet [id number]  Take 1 tablet (300 mg total) by mouth every morning. Wayne Perna, NP  Active   amLODipine (NORVASC) 10 MG tablet [id number]  Take 1 tablet (10 mg total) by mouth daily. Wayne Perna, NP  Active   atorvastatin (LIPITOR) 40 MG tablet [id number]  Take 1 tablet (40 mg total) by mouth daily. Wayne Perna, NP  Active   chlorthalidone (HYGROTON) 25 MG tablet [id number]  Take 1 tablet (25 mg total) by mouth daily. Wayne Perna, NP  Active   ibuprofen (ADVIL) 800 MG tablet [id number]  Take 1 tablet (800 mg total) by mouth every 8 (eight) hours as needed for moderate pain. Wayne Perna, NP  Active   lidocaine (LIDODERM) 5 % [id number]  Place 1 patch onto the skin daily. Remove & Discard patch within 12 hours or as directed by MD Wayne Perna, NP  Active   losartan (COZAAR) 100 MG tablet [id number]  TAKE ONE TABLET BY MOUTH EVERY Laurey Morale, NP  Active   tadalafil (CIALIS) 10 MG tablet [id number] No Take  1 tablet (10 mg total) by mouth every other day as needed for erectile dysfunction. Wayne Perna, NP Taking Active   Med List Note Wayne Perna, NP 06/27/20 1021):               Patient Active  Problem List   Diagnosis Date Noted   Primary osteoarthritis of left knee 08/22/2021   Primary osteoarthritis of right knee 08/22/2021   Prostate cancer ([place]) 04/04/2020   Malignant neoplasm of prostate ([place]) 01/17/2020   Acute gout due to renal impairment involving hand 08/12/2017   Essential hypertension 08/12/2017   Unilateral primary osteoarthritis, left knee 05/25/2016   Unilateral primary osteoarthritis, right knee 05/25/2016   Conditions to be addressed/monitored per PCP order:  Chronic healthcare management needs, gout, HTN, h/o prostate Ca, tobacco use, osteoarthritis.  Care Plan : General Plan of Care (Adult)  Updates made by Wayne Medicus, RN since 11/10/2021 12:00 AM     Problem: Health Promotion or Disease Self-Management (General Plan of Care)   Priority: Medium  Onset Date: 04/25/2020     Long-Range Goal: Self-Management Plan Developed   Start Date: 04/25/2020  Expected End Date: 02/10/2022  Recent Progress: On track  Priority: Medium  Note:    Current Barriers:   Knowledge Deficits related to short term plan for care coordination needs and long term plans for chronic disease management needs.  11/10/21:  No complaints today-to inquire with Dr. Lynne Logan  office regarding order for incontinence pads-urinary leakage continues although less, doing pelvic exercises.  BP WNL.  Uses knee brace daily and no change in smoking. Nurse Case Manager Clinical Goal(s):  patient will work with care management team to address care coordination and chronic disease management needs related to Disease Management Educational Needs Care Coordination Medication Management and Education Medication Reconciliation Medication Assistance  Psychosocial Support   Interventions:  Evaluation of current treatment plan and patient's adherence to plan as established by provider. Provided education to patient re: post surgical pain. Reviewed medications with patient.   Wayne [name] with Pharmacy   regarding medications. Discussed plans with patient for ongoing care management follow up and provided patient with direct contact information for care management team Reviewed scheduled/upcoming provider appointments.  Pharmacy referral for medication review-Pharmacist following Collaborated with PCP for nicotine patch to stop smoking-completed. 03/29/21:  Patient smoking 8 cigarettes a day. Has resources. Self Care Activities:  Patient will self administer medications as prescribed Patient will attend all scheduled provider appointments Patient will call pharmacy for medication refills Patient will call provider office for new concerns or questions Patient Goals: Over the next 30 days, patient will attend all provider appointments. Over the next 30 days, patient will start nicotine patch.-completed Over the next 30 days, patient will speak with Pharmacist regarding medications. Over the next 30 days, patient will check blood pressure on a regular basis. . Follow Up Plan: The care management team will reach out to the patient again over the next 30 business  days.    Follow Up:  Patient agrees to Care Plan and Follow-up.  Plan: The Managed Medicaid care management team will reach out to the patient again over the next 30 business  days. and The  Patient has been provided with contact information for the Managed Medicaid care management team and has been advised to call with any health related questions or concerns.  Date/time of next scheduled RN care management/care coordination outreach: 12/11/21 at 315.

## 2021-11-17 ENCOUNTER — Other Ambulatory Visit (INDEPENDENT_AMBULATORY_CARE_PROVIDER_SITE_OTHER): Payer: Self-pay | Admitting: Primary Care

## 2021-11-24 DIAGNOSIS — N5201 Erectile dysfunction due to arterial insufficiency: Secondary | ICD-10-CM | POA: Diagnosis not present

## 2021-11-24 DIAGNOSIS — N393 Stress incontinence (female) (male): Secondary | ICD-10-CM | POA: Diagnosis not present

## 2021-11-24 DIAGNOSIS — C61 Malignant neoplasm of prostate: Secondary | ICD-10-CM | POA: Diagnosis not present

## 2021-12-11 ENCOUNTER — Other Ambulatory Visit: Payer: Medicaid Other | Admitting: Obstetrics and Gynecology

## 2021-12-11 ENCOUNTER — Encounter: Payer: Self-pay | Admitting: Obstetrics and Gynecology

## 2021-12-11 NOTE — Patient Instructions (Signed)
Hi Wayne [name], Thanks for speaking with me-have a terrific Thanksgiving!!  Wayne [name] was given information about Medicaid Managed Care team care coordination services as a part of their [place] Complete Medicaid benefit. Wayne [name] verbally consented to engagement with the [facility] Managed Care team.   If you are experiencing a medical emergency, please call 911 or report to your local emergency department or urgent care.   If you have a non-emergency medical problem during routine business hours, please contact your provider's office and ask to speak with a nurse.   For questions related to your [place] Complete Medicaid health plan, please call: [phone]  If you would like to schedule transportation through your [place] Complete Medicaid plan, please call the following number at least 2 days in advance of your appointment: [phone].   There is no limit to the number of trips during the year between medical appointments, healthcare facilities, or pharmacies. Transportation must be scheduled at least 2 business days before but not more than thirty 30 days before of your appointment.  Call the [place] at [phone], at any time, 24 hours a day, 7 days a week. If you are in danger or need immediate medical attention call 911.  If you would like help to quit smoking, call 1-800-QUIT-NOW (417)324-5675) OR Espaol: 1-855-Djelo-Ya (0-388-828-0034) o para ms informacin haga clic aqu or Text READY to 200-400 to register via text  Wayne [name] - following are the goals we discussed in your visit today:   12/11/21:  Urology appt with Dr. Rebekah [place] up in June  Patient verbalizes understanding of instructions and care plan provided today and agrees to view in [place]. Active MyChart status and patient understanding of how to access instructions and care plan via MyChart confirmed with patient.     The Managed Medicaid care management team will reach  out to the patient again over the next 30 business  days.  The  Patient  has been provided with contact information for the Managed Medicaid care management team and has been advised to call with any health related questions or concerns.   [name] RN, BSN [place] Management Coordinator - Managed Medicaid High Risk [phone]   Following is a copy of your plan of care:  Care Plan : General Plan of Care (Adult)  Updates made by Gayla Medicus, RN since 12/11/2021 12:00 AM     Problem: Health Promotion or Disease Self-Management (General Plan of Care)   Priority: Medium  Onset Date: 04/25/2020     Long-Range Goal: Self-Management Plan Developed   Start Date: 04/25/2020  Expected End Date: 02/10/2022  Recent Progress: On track  Priority: Medium  Note:    Current Barriers:   Knowledge Deficits related to short term plan for care coordination needs and long term plans for chronic disease management needs.  12/11/21:  Patient with gout flair today, improving.  BP WNL.  To check with insurance regarding coverage for incontinence pads.  No change in smoking-8 cigarettes a day.  Urology exam WNL. Nurse Case Manager Clinical Goal(s):  patient will work with care management team to address care coordination and chronic disease management needs related to Disease Management Educational Needs Care Coordination Medication Management and Education Medication Reconciliation Medication Assistance  Psychosocial Support   Interventions:  Evaluation of current treatment plan and patient's adherence to plan as established by provider. Provided education to patient re: post surgical pain. Reviewed medications with patient.   Marland Kitchen  Collaborated with Pharmacy  regarding medications. Discussed plans with patient for ongoing care management follow up and provided patient with direct contact information for care management team Reviewed scheduled/upcoming provider  appointments.  Pharmacy referral for medication review-Pharmacist following Collaborated with PCP for nicotine patch to stop smoking-completed. 03/29/21:  Patient smoking 8 cigarettes a day. Has resources. Self Care Activities:  Patient will self administer medications as prescribed Patient will attend all scheduled provider appointments Patient will call pharmacy for medication refills Patient will call provider office for new concerns or questions Patient will contact insurance provider for coverage of incontinence pads. Patient Goals: Over the next 30 days, patient will attend all provider appointments. Over the next 30 days, patient will start nicotine patch.-completed Over the next 30 days, patient will speak with Pharmacist regarding medications. Over the next 30 days, patient will check blood pressure on a regular basis. . Follow Up Plan: The care management team will reach out to the patient again over the next 30 business  days.

## 2021-12-11 NOTE — Patient Outreach (Signed)
Medicaid Managed Care   Nurse Care Manager Note  12/11/2021 Name:  Wayne [name] MRN:  [id number] DOB:  01/02/1961  Wayne [name] is an 61 y.o. year old male who is a primary patient of [name], NP.  The [facility] Managed Care Coordination team was consulted for assistance with:    Chronic healthcare management needs, gout, tobacco use, HTN, h/o prostate cancer  [name] was given information about Medicaid Managed Care Coordination team services today. Wayne [name] Patient agreed to services and verbal consent obtained.  Engaged with patient by telephone for follow up visit in response to provider referral for case management and/or care coordination services.   Assessments/Interventions:  Review of past medical history, allergies, medications, health status, including review of consultants reports, laboratory and other test data, was performed as part of comprehensive evaluation and provision of chronic care management services.  SDOH (Social Determinants of Health) assessments and interventions performed: SDOH Interventions    Flowsheet Row Patient Outreach Telephone from 12/11/2021 in [place] Patient Outreach Telephone from 11/10/2021 in [place] Coordination Patient Outreach Telephone from 10/09/2021 in [place] Patient Outreach Telephone from 08/30/2021 in [place] Patient Outreach Telephone from 05/31/2021 in [place] Patient Outreach Telephone from 04/25/2021 in [place] Interventions        Food Insecurity Interventions -- -- -- Intervention Not Indicated -- --  Housing Interventions -- -- Intervention Not Indicated -- -- Intervention Not Indicated  Utilities Interventions -- -- Intervention Not Indicated -- -- --  Alcohol Usage  Interventions Intervention Not Indicated (Score <7) -- -- -- -- --  Financial Strain Interventions -- Intervention Not Indicated -- -- -- --  Physical Activity Interventions -- Intervention Not Indicated  [knee pain, gout, osteoarthritis limits his activity] -- -- -- Intervention Not Indicated  [walks as he is able with his knees]  Stress Interventions Intervention Not Indicated -- -- -- Intervention Not Indicated --     Care Plan  Allergies  Allergen Reactions   Hydralazine Other (See Comments)    Headache   Medications Reviewed Today     Reviewed by Gayla Medicus, RN (Registered Nurse) on 12/11/21 at [place] List Status: <None>   Medication Order Taking? Sig Documenting Provider Last Dose Status Informant  allopurinol (ZYLOPRIM) 300 MG tablet [id number]  Take 1 tablet (300 mg total) by mouth every morning. [name], NP  Active   amLODipine (NORVASC) 10 MG tablet [id number]  Take 1 tablet (10 mg total) by mouth daily. [name], NP  Active   atorvastatin (LIPITOR) 40 MG tablet [id number]  Take 1 tablet (40 mg total) by mouth daily. [name], NP  Active   chlorthalidone (HYGROTON) 25 MG tablet [id number]  TAKE ONE TABLET BY MOUTH ONCE DAILY [name], NP  Active   ibuprofen (ADVIL) 800 MG tablet [id number]  Take 1 tablet (800 mg total) by mouth every 8 (eight) hours as needed for moderate pain. [name], NP  Active   lidocaine (LIDODERM) 5 % [id number]  Place 1 patch onto the skin daily. Remove & Discard patch within 12 hours or as directed by MD [name], NP  Active   losartan (COZAAR) 100 MG tablet [id number]  TAKE ONE TABLET BY MOUTH EVERY EVENING [name], NP  Active   tadalafil (  CIALIS) 10 MG tablet [id number] No Take 1 tablet (10 mg total) by mouth every other day as needed for erectile dysfunction. [name], NP Taking Active   Med List Note [name], NP 06/27/20 1021):                Patient Active Problem List   Diagnosis Date Noted   Primary osteoarthritis of left knee 08/22/2021   Primary osteoarthritis of right knee 08/22/2021   Prostate cancer ([place]) 04/04/2020   Malignant neoplasm of prostate ([employer]) 01/17/2020   Acute gout due to renal impairment involving hand 08/12/2017   Essential hypertension 08/12/2017   Unilateral primary osteoarthritis, left knee 05/25/2016   Unilateral primary osteoarthritis, right knee 05/25/2016   Conditions to be addressed/monitored per PCP order:  Chronic healthcare management needs, gout, tobacco use, HTN, h/o prostate cancer  Care Plan : General Plan of Care (Adult)  Updates made by Gayla Medicus, RN since 12/11/2021 12:00 AM     Problem: Health Promotion or Disease Self-Management (General Plan of Care)   Priority: Medium  Onset Date: 04/25/2020     Long-Range Goal: Self-Management Plan Developed   Start Date: 04/25/2020  Expected End Date: 02/10/2022  Recent Progress: On track  Priority: Medium  Note:    Current Barriers:   Knowledge Deficits related to short term plan for care coordination needs and long term plans for chronic disease management needs.  12/11/21:  Patient with gout flair today, improving.  BP WNL.  To check with insurance regarding coverage for incontinence pads.  No change in smoking-8 cigarettes a day.  Urology exam WNL. Nurse Case Manager Clinical Goal(s):  patient will work with care management team to address care coordination and chronic disease management needs related to Disease Management Educational Needs Care Coordination Medication Management and Education Medication Reconciliation Medication Assistance  Psychosocial Support   Interventions:  Evaluation of current treatment plan and patient's adherence to plan as established by provider. Provided education to patient re: post surgical pain. Reviewed medications with patient.   [name] with Pharmacy  regarding  medications. Discussed plans with patient for ongoing care management follow up and provided patient with direct contact information for care management team Reviewed scheduled/upcoming provider appointments.  Pharmacy referral for medication review-Pharmacist following Collaborated with PCP for nicotine patch to stop smoking-completed. 03/29/21:  Patient smoking 8 cigarettes a day. Has resources. Self Care Activities:  Patient will self administer medications as prescribed Patient will attend all scheduled provider appointments Patient will call pharmacy for medication refills Patient will call provider office for new concerns or questions Patient will contact insurance provider for coverage of incontinence pads. Patient Goals: Over the next 30 days, patient will attend all provider appointments. Over the next 30 days, patient will start nicotine patch.-completed Over the next 30 days, patient will speak with Pharmacist regarding medications. Over the next 30 days, patient will check blood pressure on a regular basis. . Follow Up Plan: The care management team will reach out to the patient again over the next 30 business  days.    Follow Up:  Patient agrees to Care Plan and Follow-up.  Plan: The Managed Medicaid care management team will reach out to the patient again over the next 30 business  days. and The  Patient has been provided with contact information for the Managed Medicaid care management team and has been advised to call with any health related questions or concerns.  Date/time of next scheduled RN care management/care coordination  outreach:  01/11/22 at 145.

## 2022-01-11 ENCOUNTER — Encounter: Payer: Self-pay | Admitting: Obstetrics and Gynecology

## 2022-01-11 ENCOUNTER — Other Ambulatory Visit: Payer: Medicaid Other | Admitting: Obstetrics and Gynecology

## 2022-01-11 NOTE — Patient Outreach (Signed)
Medicaid Managed Care   Nurse Care Manager Note  01/11/2022 Name:  Wayne [name] MRN:  [id number] DOB:  Feb 13, 1960  Wayne [name] is an 61 y.o. year old male who is a primary patient of Wayne Perna, NP.  The [facility] Managed Care Coordination team was consulted for assistance with:    Chronic healthcare management needs, gout, tobacco use, HTN, h/o prostate cancer  Wayne [name] was given information about Medicaid Managed Care Coordination team services today. Wayne [name] Patient agreed to services and verbal consent obtained.  Engaged with patient by telephone for follow up visit in response to provider referral for case management and/or care coordination services.   Assessments/Interventions:  Review of past medical history, allergies, medications, health status, including review of consultants reports, laboratory and other test data, was performed as part of comprehensive evaluation and provision of chronic care management services.  SDOH (Social Determinants of Health) assessments and interventions performed: SDOH Interventions    Flowsheet Row Patient Outreach Telephone from 01/11/2022 in [place] Patient Outreach Telephone from 12/11/2021 in [place] Patient Outreach Telephone from 11/10/2021 in [place] Coordination Patient Outreach Telephone from 10/09/2021 in [place] Patient Outreach Telephone from 08/30/2021 in [place] Patient Outreach Telephone from 05/31/2021 in [place] Interventions        Food Insecurity Interventions -- -- -- -- Intervention Not Indicated --  Housing Interventions -- -- -- Intervention Not Indicated -- --  Transportation Interventions Intervention Not Indicated -- -- -- -- --  Utilities Interventions -- -- --  Intervention Not Indicated -- --  Alcohol Usage Interventions -- Intervention Not Indicated (Score <7) -- -- -- --  Financial Strain Interventions -- -- Intervention Not Indicated -- -- --  Physical Activity Interventions -- -- Intervention Not Indicated  [knee pain, gout, osteoarthritis limits his activity] -- -- --  Stress Interventions -- Intervention Not Indicated -- -- -- Intervention Not Indicated  Social Connections Interventions Intervention Not Indicated -- -- -- -- --      Care Plan  Allergies  Allergen Reactions   Hydralazine Other (See Comments)    Headache    Medications Reviewed Today     Reviewed by Wayne Medicus, RN (Registered Nurse) on 01/11/22 at 1349  Med List Status: <None>   Medication Order Taking? Sig Documenting Provider Last Dose Status Informant  allopurinol (ZYLOPRIM) 300 MG tablet [id number]  Take 1 tablet (300 mg total) by mouth every morning. Wayne Perna, NP  Active   amLODipine (NORVASC) 10 MG tablet [id number]  Take 1 tablet (10 mg total) by mouth daily. Wayne Perna, NP  Active   atorvastatin (LIPITOR) 40 MG tablet [id number]  Take 1 tablet (40 mg total) by mouth daily. Wayne Perna, NP  Active   chlorthalidone (HYGROTON) 25 MG tablet [id number]  TAKE ONE TABLET BY MOUTH ONCE DAILY Wayne Perna, NP  Active   ibuprofen (ADVIL) 800 MG tablet [id number]  Take 1 tablet (800 mg total) by mouth every 8 (eight) hours as needed for moderate pain. Wayne Perna, NP  Active   lidocaine (LIDODERM) 5 % [id number]  Place 1 patch onto the skin daily. Remove & Discard patch within 12 hours or as directed by MD Wayne Perna, NP  Active   losartan (COZAAR) 100 MG tablet [id number]  TAKE ONE TABLET BY MOUTH  EVERY EVENING Wayne Perna, NP  Active   tadalafil (CIALIS) 10 MG tablet [id number] No Take 1 tablet (10 mg total) by mouth every other day as needed for erectile dysfunction. Wayne Perna, NP Taking Active   Med List  Note Wayne Perna, NP 06/27/20 1021):               Patient Active Problem List   Diagnosis Date Noted   Primary osteoarthritis of left knee 08/22/2021   Primary osteoarthritis of right knee 08/22/2021   Prostate cancer ([place]) 04/04/2020   Malignant neoplasm of prostate ([place]) 01/17/2020   Acute gout due to renal impairment involving hand 08/12/2017   Essential hypertension 08/12/2017   Unilateral primary osteoarthritis, left knee 05/25/2016   Unilateral primary osteoarthritis, right knee 05/25/2016   Conditions to be addressed/monitored per PCP order:  Chronic healthcare management needs, gout, tobacco use, HTN, h/o prostate cancer  Care Plan : General Plan of Care (Adult)  Updates made by Wayne Medicus, RN since 01/11/2022 12:00 AM     Problem: Health Promotion or Disease Self-Management (General Plan of Care)   Priority: Medium  Onset Date: 04/25/2020     Long-Range Goal: Self-Management Plan Developed   Start Date: 04/25/2020  Expected End Date: 04/12/2022  Recent Progress: On track  Priority: Medium  Note:    Current Barriers:   Knowledge Deficits related to short term plan for care coordination needs and long term plans for chronic disease management, HTN, gout, h/o prostate cancer, tobacco use 01/11/22:  Patient smoking 8-10 cigarettes a day-discussed smoking cessation-states pills have worked best for him in past-will check on getting refill.  BP WNL  To schedule f/u appt with PCP. Nurse Case Manager Clinical Goal(s):  patient will work with care management team to address care coordination and chronic disease management needs related to Disease Management Educational Needs Care Coordination Medication Management and Education Medication Reconciliation Medication Assistance  Psychosocial Support   Interventions:  Evaluation of current treatment plan and patient's adherence to plan as established by provider. Provided education to patient re: post surgical  pain. Reviewed medications with patient.   Wayne [name] with Pharmacy  regarding medications. Discussed plans with patient for ongoing care management follow up and provided patient with direct contact information for care management team Reviewed scheduled/upcoming provider appointments.  Pharmacy referral for medication review-Pharmacist following Collaborated with PCP for nicotine patch to stop smoking-completed. 01/11/22:   Patient smoking 8-10  cigarettes a day. Has resources. Self Care Activities:  Patient will self administer medications as prescribed Patient will attend all scheduled provider appointments Patient will call pharmacy for medication refills Patient will call provider office for new concerns or questions Patient will contact insurance provider for coverage of incontinence pads. Patient Goals: Over the next 30 days, patient will attend all provider appointments. Over the next 30 days, patient will start nicotine patch.-completed Over the next 30 days, patient will speak with Pharmacist regarding medications. Over the next 30 days, patient will check blood pressure on a regular basis. . Follow Up Plan: The care management team will reach out to the patient again over the next 30 business  days.    Follow Up:  Patient agrees to Care Plan and Follow-up.  Plan: The Managed Medicaid care management team will reach out to the patient again over the next 30 business  days. and The  Patient has been provided with contact information for the Managed Medicaid care management team and has been advised to  call with any health related questions or concerns.  Date/time of next scheduled RN care management/care coordination outreach:  02/14/22 at 0900.

## 2022-01-11 NOTE — Patient Instructions (Addendum)
Hi Mr. Cauthorn, thanks for speaking with me today-Happy Holidays!!  Mr. See was given information about Medicaid Managed Care team care coordination services as a part of their [place] Medicaid benefit. [name] verbally consented to engagement with the [facility] Managed Care team.   If you are experiencing a medical emergency, please call 911 or report to your local emergency department or urgent care.   If you have a non-emergency medical problem during routine business hours, please contact your provider's office and ask to speak with a nurse.   For questions related to your [facility], please call: [phone] or visit the homepage here: [email or web address]  If you would like to schedule transportation through your [facility], please call the following number at least 2 days in advance of your appointment: [phone]   Rides for urgent appointments can also be made after hours by calling Member Services.  Call the [place] at [phone], at any time, 24 hours a day, 7 days a week. If you are in danger or need immediate medical attention call 911.  If you would like help to quit smoking, call 1-800-QUIT-NOW 205-673-2690) OR Espaol: 1-855-Djelo-Ya (5-035-465-6812) o para ms informacin haga clic aqu or Text READY to 200-400 to register via text  Mr. Passow - following are the goals we discussed in your visit today:   Goals Addressed             This Visit's Progress    Disease Progression Prevented or Minimized       01/11/22  Urology appt with [name] WNL-follow up in June    Patient verbalizes understanding of instructions and care plan provided today and agrees to view in [place]. Active MyChart status and patient understanding of how to access instructions and care plan via MyChart confirmed with patient.      The Managed Medicaid care management team will reach out to the patient again over the next 30 business  days.  The  Patient  has been provided with contact information for the Managed Medicaid care management team and has been advised to call with any health related questions or concerns.   [name] RN, BSN [place] Management Coordinator - Managed Medicaid High Risk [phone]   Following is a copy of your plan of care:  Care Plan : General Plan of Care (Adult)  Updates made by Gayla Medicus, RN since 01/11/2022 12:00 AM     Problem: Health Promotion or Disease Self-Management (General Plan of Care)   Priority: Medium  Onset Date: 04/25/2020     Long-Range Goal: Self-Management Plan Developed   Start Date: 04/25/2020  Expected End Date: 04/12/2022  Recent Progress: On track  Priority: Medium  Note:    Current Barriers:   Knowledge Deficits related to short term plan for care coordination needs and long term plans for chronic disease management, HTN, gout, h/o prostate cancer, tobacco use 01/11/22:  Patient smoking 8-10 cigarettes a day-discussed smoking cessation-states pills have worked best for him in past-will check on getting refill.  BP WNL  To schedule f/u appt with PCP. Nurse Case Manager Clinical Goal(s):  patient will work with care management team to address care coordination and chronic disease management needs related to Disease Management Educational Needs Care Coordination Medication Management and Education Medication Reconciliation Medication Assistance  Psychosocial Support   Interventions:  Evaluation of current treatment plan and patient's  adherence to plan as established by provider. Provided education to patient re: post surgical pain. Reviewed medications with patient.   [name] with Pharmacy  regarding medications. Discussed plans with patient for ongoing care management follow up and provided patient with  direct contact information for care management team Reviewed scheduled/upcoming provider appointments.  Pharmacy referral for medication review-Pharmacist following Collaborated with PCP for nicotine patch to stop smoking-completed. 01/11/22:   Patient smoking 8-10  cigarettes a day. Has resources. Self Care Activities:  Patient will self administer medications as prescribed Patient will attend all scheduled provider appointments Patient will call pharmacy for medication refills Patient will call provider office for new concerns or questions Patient will contact insurance provider for coverage of incontinence pads. Patient Goals: Over the next 30 days, patient will attend all provider appointments. Over the next 30 days, patient will start nicotine patch.-completed Over the next 30 days, patient will speak with Pharmacist regarding medications. Over the next 30 days, patient will check blood pressure on a regular basis. . Follow Up Plan: The care management team will reach out to the patient again over the next 30 business  days.

## 2022-02-05 ENCOUNTER — Other Ambulatory Visit (INDEPENDENT_AMBULATORY_CARE_PROVIDER_SITE_OTHER): Payer: Self-pay | Admitting: Primary Care

## 2022-02-05 DIAGNOSIS — I10 Essential (primary) hypertension: Secondary | ICD-10-CM

## 2022-02-05 DIAGNOSIS — Z760 Encounter for issue of repeat prescription: Secondary | ICD-10-CM

## 2022-02-05 NOTE — Telephone Encounter (Signed)
Needs appt

## 2022-02-12 ENCOUNTER — Other Ambulatory Visit (INDEPENDENT_AMBULATORY_CARE_PROVIDER_SITE_OTHER): Payer: Self-pay | Admitting: Primary Care

## 2022-02-13 NOTE — Telephone Encounter (Signed)
Requested medications are due for refill today.  yes  Requested medications are on the active medications list. yes  Last refill. 11/17/2021 #90 0 rf  Future visit scheduled.   yes  Notes to clinic.  Expired labs.    Requested Prescriptions  Pending Prescriptions Disp Refills   chlorthalidone (HYGROTON) 25 MG tablet [Pharmacy Med Name: chlorthalidone 25 mg tablet] 90 tablet 0    Sig: TAKE ONE TABLET BY MOUTH ONCE DAILY     Cardiovascular: Diuretics - Thiazide Failed - 02/12/2022 12:24 PM      Failed - Cr in normal range and within 180 days    Creatinine, Ser  Date Value Ref Range Status  03/30/2020 0.85 0.61 - 1.24 mg/dL Final         Failed - K in normal range and within 180 days    Potassium  Date Value Ref Range Status  03/30/2020 3.6 3.5 - 5.1 mmol/L Final         Failed - Na in normal range and within 180 days    Sodium  Date Value Ref Range Status  03/30/2020 141 135 - 145 mmol/L Final  06/10/2019 141 134 - 144 mmol/L Final         Failed - Valid encounter within last 6 months    Recent Outpatient Visits           6 months ago Medication refill   [place], [name] P, NP   9 months ago Blood pressure check   [place], [place], NP   11 months ago Essential hypertension   [place], [place], NP   1 year ago Essential hypertension   [place], Anthony P, NP   1 year ago Erectile disorder due to medical condition in male   [place], [place], NP       Future Appointments             Tomorrow [name], NP [place] - Last BP in normal range    BP Readings from Last 1 Encounters:  07/31/21 125/85

## 2022-02-14 ENCOUNTER — Other Ambulatory Visit: Payer: Medicaid Other | Admitting: Obstetrics and Gynecology

## 2022-02-14 ENCOUNTER — Ambulatory Visit (INDEPENDENT_AMBULATORY_CARE_PROVIDER_SITE_OTHER): Payer: Medicaid Other | Admitting: Primary Care

## 2022-02-14 ENCOUNTER — Encounter (INDEPENDENT_AMBULATORY_CARE_PROVIDER_SITE_OTHER): Payer: Self-pay | Admitting: Primary Care

## 2022-02-14 ENCOUNTER — Other Ambulatory Visit (INDEPENDENT_AMBULATORY_CARE_PROVIDER_SITE_OTHER): Payer: Self-pay | Admitting: Primary Care

## 2022-02-14 ENCOUNTER — Encounter: Payer: Self-pay | Admitting: Obstetrics and Gynecology

## 2022-02-14 VITALS — BP 131/81 | HR 69 | Resp 16 | Ht 75.0 in | Wt 243.4 lb

## 2022-02-14 DIAGNOSIS — E782 Mixed hyperlipidemia: Secondary | ICD-10-CM

## 2022-02-14 DIAGNOSIS — Z760 Encounter for issue of repeat prescription: Secondary | ICD-10-CM

## 2022-02-14 DIAGNOSIS — I10 Essential (primary) hypertension: Secondary | ICD-10-CM

## 2022-02-14 DIAGNOSIS — Z131 Encounter for screening for diabetes mellitus: Secondary | ICD-10-CM | POA: Diagnosis not present

## 2022-02-14 DIAGNOSIS — M1A09X1 Idiopathic chronic gout, multiple sites, with tophus (tophi): Secondary | ICD-10-CM

## 2022-02-14 MED ORDER — CHLORTHALIDONE 25 MG PO TABS: 25.0000 mg | ORAL_TABLET | Freq: Every day | ORAL | 1 refills | Status: DC

## 2022-02-14 MED ORDER — IBUPROFEN 800 MG PO TABS: 800.0000 mg | ORAL_TABLET | Freq: Three times a day (TID) | ORAL | Status: DC | PRN

## 2022-02-14 NOTE — Patient Instructions (Signed)
Hi Wayne [name] speaking to you today-I hope your appointment goes well today-have a wonderful day!!  Wayne [name] was given information about Medicaid Managed Care team care coordination services as a part of their [place] Medicaid benefit. Wayne [name] verbally consented to engagement with the [facility] Managed Care team.   If you are experiencing a medical emergency, please call 911 or report to your local emergency department or urgent care.   If you have a non-emergency medical problem during routine business hours, please contact your provider's office and ask to speak with a nurse.   For questions related to your [facility], please call: [phone] or visit the homepage here: [email or web address]  If you would like to schedule transportation through your [facility], please call the following number at least 2 days in advance of your appointment: [phone]   Rides for urgent appointments can also be made after hours by calling Member Services.  Call the [place] at [phone], at any time, 24 hours a day, 7 days a week. If you are in danger or need immediate medical attention call 911.  If you would like help to quit smoking, call 1-800-QUIT-NOW (435)035-5886) OR Espaol: 1-855-Djelo-Ya (3-500-938-1829) o para ms informacin haga clic aqu or Text READY to 200-400 to register via text  Wayne [name] - following are the goals we discussed in your visit today:   Goals Addressed             This Visit's Progress    Disease Progression Prevented or Minimized       02/14/22:  Urology appt with Dr. Alinda [name] WNL-follow up in June   Patient verbalizes understanding of instructions and care plan provided today and agrees to view in [place]. Active MyChart status and patient understanding of how to access instructions and care plan  via MyChart confirmed with patient.     The Managed Medicaid care management team will reach out to the patient again over the next 30 business  days.  The  Patient  has been provided with contact information for the Managed Medicaid care management team and has been advised to call with any health related questions or concerns.   Wayne Raider RN, BSN [place] Management Coordinator - Managed Medicaid High Risk [phone]   Following is a copy of your plan of care:  Care Plan : General Plan of Care (Adult)  Updates made by Gayla Medicus, RN since 02/14/2022 12:00 AM     Problem: Health Promotion or Disease Self-Management (General Plan of Care)   Priority: Medium  Onset Date: 04/25/2020     Long-Range Goal: Self-Management Plan Developed   Start Date: 04/25/2020  Expected End Date: 04/12/2022  Recent Progress: On track  Priority: Medium  Note:    Current Barriers:   Knowledge Deficits related to short term plan for care coordination needs and long term plans for chronic disease management, HTN, gout, h/o prostate cancer, tobacco use 02/14/22:  Patient has PCP appt today and will discuss smoking-smoking 8-10 cigarettes a day.  Not checking BP currently.  Gout flare up with colder weather but better now.  Dr. Alinda [name] follow up in June.  Nurse Case Manager Clinical Goal(s):  patient will work with care management team to address care coordination and chronic disease management needs related to Disease Management Educational Needs Care Coordination Medication Management and Education Medication Reconciliation Medication Assistance  Psychosocial Support  Interventions:  Evaluation of current treatment plan and patient's adherence to plan as established by provider. Provided education to patient re: post surgical pain. Reviewed medications with patient.   [name] with Pharmacy  regarding medications. Discussed plans with patient for ongoing  care management follow up and provided patient with direct contact information for care management team Reviewed scheduled/upcoming provider appointments.  Pharmacy referral for medication review-Pharmacist following Collaborated with PCP for nicotine patch to stop smoking-completed. 01/11/22:   Patient smoking 8-10  cigarettes a day. Has resources. Self Care Activities:  Patient will self administer medications as prescribed Patient will attend all scheduled provider appointments Patient will call pharmacy for medication refills Patient will call provider office for new concerns or questions Patient will contact insurance provider for coverage of incontinence pads. Patient Goals: Over the next 30 days, patient will attend all provider appointments. Over the next 30 days, patient will start nicotine patch.-completed Over the next 30 days, patient will speak with Pharmacist regarding medications. Over the next 30 days, patient will check blood pressure on a regular basis. . Follow Up Plan: The care management team will reach out to the patient again over the next 30 business  days.

## 2022-02-14 NOTE — Progress Notes (Signed)
[facility]   [name] is a 62 y.o. male presents for hypertension evaluation, Denies shortness of breath, headaches, chest pain or lower extremity edema, sudden onset, vision changes, unilateral weakness, dizziness, paresthesias . He does continue to smoke cigarettes 10 a day. Continuing to reduce cigarettes on her own.  Patient reports adherence with medications.  Dietary habits include: monitoring Na  Exercise habits include:walking  Family / Social history: No   Past Medical History:  Diagnosis Date   Arthritis    shoulders    Gout    High cholesterol    Hypertension    Prostate cancer [facility]    Past Surgical History:  Procedure Laterality Date   KNEE ARTHROSCOPY     LUMBAR DISC SURGERY     LYMPHADENECTOMY Bilateral 04/04/2020   Procedure: LYMPHADENECTOMY, PELVIC;  Surgeon: Raynelle Bring, MD;  Location: [facility];  Service: Urology;  Laterality: Bilateral;   ROBOT ASSISTED LAPAROSCOPIC RADICAL PROSTATECTOMY N/A 04/04/2020   Procedure: XI ROBOTIC ASSISTED LAPAROSCOPIC RADICAL PROSTATECTOMY LEVEL 2;  Surgeon: Raynelle Bring, MD;  Location: [facility];  Service: Urology;  Laterality: N/A;   Allergies  Allergen Reactions   Hydralazine Other (See Comments)    Headache   Current Outpatient Medications on File Prior to Visit  Medication Sig Dispense Refill   allopurinol (ZYLOPRIM) 300 MG tablet Take 1 tablet (300 mg total) by mouth every morning. 90 tablet 3   amLODipine (NORVASC) 10 MG tablet Take 1 tablet (10 mg total) by mouth daily. 90 tablet 3   atorvastatin (LIPITOR) 40 MG tablet Take 1 tablet (40 mg total) by mouth daily. 90 tablet 3   chlorthalidone (HYGROTON) 25 MG tablet TAKE ONE TABLET BY MOUTH ONCE DAILY 90 tablet 0   ibuprofen (ADVIL) 800 MG tablet Take 1 tablet (800 mg total) by mouth every 8 (eight) hours as needed for moderate pain. 90 tablet 1   lidocaine (LIDODERM) 5 % Place 1 patch onto the skin daily. Remove & Discard patch within 12 hours  or as directed by MD 30 patch 1   losartan (COZAAR) 100 MG tablet TAKE ONE TABLET BY MOUTH EVERY EVENING 30 tablet 0   tadalafil (CIALIS) 10 MG tablet Take 1 tablet (10 mg total) by mouth every other day as needed for erectile dysfunction. 10 tablet 1   No current facility-administered medications on file prior to visit.   Social History   Socioeconomic History   Marital status: Married    Spouse name: Mazon   Number of children: 4   Years of education: Not on file   Highest education level: Not on file  Occupational History    Comment: patient does not work full time  Tobacco Use   Smoking status: Every Day    Packs/day: 0.50    Types: Cigarettes   Smokeless tobacco: Never   Tobacco comments:    Started smoking at the age of 50  Vaping Use   Vaping Use: Never used  Substance and Sexual Activity   Alcohol use: Yes    Alcohol/week: 14.0 standard drinks of alcohol    Types: 14 Cans of beer per week   Drug use: No   Sexual activity: Yes  Other Topics Concern   Not on file  Social History Narrative   Not on file   Social Determinants of Health   Financial Resource Strain: Low Risk  (11/10/2021)   Overall Financial Resource Strain (CARDIA)    Difficulty of Paying Living Expenses: Not hard at all  Food Insecurity: No Food Insecurity (08/30/2021)   Hunger Vital Sign    Worried About Running Out of Food in the Last Year: Never true    Ran Out of Food in the Last Year: Never true  Transportation Needs: No Transportation Needs (01/11/2022)   PRAPARE - Hydrologist (Medical): No    Lack of Transportation (Non-Medical): No  Physical Activity: Insufficiently Active (11/10/2021)   Exercise Vital Sign    Days of Exercise per Week: 7 days    Minutes of Exercise per Session: 10 min  Stress: No Stress Concern Present (12/11/2021)   [place]    Feeling of Stress : Not at all  Social  Connections: Moderately Integrated (01/11/2022)   Social Connection and Isolation Panel [NHANES]    Frequency of Communication with Friends and Family: Three times a week    Frequency of Social Gatherings with Friends and Family: Three times a week    Attends Religious Services: 1 to 4 times per year    Active Member of Clubs or Organizations: No    Attends Archivist Meetings: Never    Marital Status: Married  Human resources officer Violence: Not At Risk (02/14/2022)   Humiliation, Afraid, Rape, and Kick questionnaire    Fear of Current or Ex-Partner: No    Emotionally Abused: No    Physically Abused: No    Sexually Abused: No   Family History  Problem Relation Age of Onset   Colon cancer Mother    Pancreatic cancer Maternal Uncle    Colon polyps Neg Hx    Esophageal cancer Neg Hx    Rectal cancer Neg Hx      OBJECTIVE:  Vitals:   02/14/22 1340  BP: 131/81  Pulse: 69  Resp: 16  SpO2: 97%  Weight: 243 lb 6.4 oz (110.4 kg)  Height: '6\' 3"'$  (1.905 m)    Physical Exam Vitals reviewed.  Constitutional:      Appearance: He is obese.  HENT:     Head: Normocephalic.     Right Ear: Tympanic membrane and external ear normal.     Left Ear: Tympanic membrane and external ear normal.  Eyes:     Extraocular Movements: Extraocular movements intact.  Cardiovascular:     Rate and Rhythm: Normal rate and regular rhythm.  Pulmonary:     Effort: Pulmonary effort is normal.     Breath sounds: Normal breath sounds.  Abdominal:     General: Abdomen is flat. There is distension.     Palpations: Abdomen is soft.  Musculoskeletal:        General: Normal range of motion.     Cervical back: Normal range of motion and neck supple.  Skin:    General: Skin is warm and dry.  Neurological:     Mental Status: He is alert and oriented to person, place, and time.  Psychiatric:        Mood and Affect: Mood normal.        Behavior: Behavior normal.    ROS Comprehensive ROS Pertinent  positive and negative noted in HPI   Last 3 Office BP readings: BP Readings from Last 3 Encounters:  02/14/22 131/81  07/31/21 125/85  03/13/21 120/84    BMET    Component Value Date/Time   NA 141 03/30/2020 1041   NA 141 06/10/2019 1045   K 3.6 03/30/2020 1041   CL 106 03/30/2020 1041   CO2  21 (L) 03/30/2020 1041   GLUCOSE 105 (H) 03/30/2020 1041   BUN 12 03/30/2020 1041   BUN 14 06/10/2019 1045   CREATININE 0.85 03/30/2020 1041   CALCIUM 9.4 03/30/2020 1041   GFRNONAA >60 03/30/2020 1041   GFRAA 82 06/10/2019 1045    Renal function: CrCl cannot be calculated (Patient's most recent lab result is older than the maximum 21 days allowed.).  Clinical ASCVD: Yes  The 10-year ASCVD risk score (Arnett DK, et al., 2019) is: 23.7%   Values used to calculate the score:     Age: 63 years     Sex: Male     Is Non-Hispanic African American: Yes     Diabetic: No     Tobacco smoker: Yes     Systolic Blood Pressure: 492 mmHg     Is BP treated: Yes     HDL Cholesterol: 37 mg/dL     Total Cholesterol: 146 mg/dL  ASCVD risk factors include- [name]   ASSESSMENT & PLAN  Priyansh was seen today for hypertension.  Diagnoses and all orders for this visit:  Mixed hyperlipidemia  Healthy lifestyle diet of fruits vegetables fish nuts whole grains and low saturated fat . Foods high in cholesterol or liver, fatty meats,cheese, butter avocados, nuts and seeds, chocolate and fried foods. -     Lipid panel  Screening for diabetes mellitus -     CBC with Differential/Platelet -     Hemoglobin A1c   Essential hypertension -Counseled on lifestyle modifications for blood pressure control including reduced dietary sodium, increased exercise, weight reduction and adequate sleep. Also, educated patient about the risk for cardiovascular events, stroke and heart attack. Also counseled patient about the importance of medication adherence. If you participate in smoking, it is important to stop using  tobacco as this will increase the risks associated with uncontrolled blood pressure.  Goal BP:  For patients younger than 60: Goal BP < 130/80. For patients 60 and older: Goal BP < 140/90. For patients with diabetes: Goal BP < 130/80. Your most recent BP: 131/81  Minimize salt intake. Minimize alcohol intake   -     CMP14+EGFR [place] This note has been created with Surveyor, quantity. Any transcriptional errors are unintentional.   [name], NP 02/14/2022, 2:01 PM

## 2022-02-14 NOTE — Patient Outreach (Signed)
Medicaid Managed Care   Nurse Care Manager Note  02/14/2022 Name:  Wayne [name] MRN:  [id number] DOB:  Nov 12, 1960  Wayne [name] is an 62 y.o. year old male who is a primary patient of Wayne Perna, NP.  The [facility] Managed Care Coordination team was consulted for assistance with:    Chronic healthcare management needs, gout, tobacco use, HTN, h/o prostate cancer  Wayne [name] was given information about Medicaid Managed Care Coordination team services today. Wayne [name] Patient agreed to services and verbal consent obtained.  Engaged with patient by telephone for follow up visit in response to provider referral for case management and/or care coordination services.   Assessments/Interventions:  Review of past medical history, allergies, medications, health status, including review of consultants reports, laboratory and other test data, was performed as part of comprehensive evaluation and provision of chronic care management services.  SDOH (Social Determinants of Health) assessments and interventions performed: SDOH Interventions    Flowsheet Row Patient Outreach Telephone from 01/11/2022 in [place] Patient Outreach Telephone from 12/11/2021 in [place] Patient Outreach Telephone from 11/10/2021 in [place] Patient Outreach Telephone from 10/09/2021 in [place] Patient Outreach Telephone from 08/30/2021 in [place] Patient Outreach Telephone from 05/31/2021 in [place] Interventions        Food Insecurity Interventions -- -- -- -- Intervention Not Indicated --  Housing Interventions -- -- -- Intervention Not Indicated -- --  Transportation Interventions Intervention Not Indicated -- -- -- -- --  Utilities Interventions -- -- --  Intervention Not Indicated -- --  Alcohol Usage Interventions -- Intervention Not Indicated (Score <7) -- -- -- --  Financial Strain Interventions -- -- Intervention Not Indicated -- -- --  Physical Activity Interventions -- -- Intervention Not Indicated  [knee pain, gout, osteoarthritis limits his activity] -- -- --  Stress Interventions -- Intervention Not Indicated -- -- -- Intervention Not Indicated  Social Connections Interventions Intervention Not Indicated -- -- -- -- --     Care Plan  Allergies  Allergen Reactions   Hydralazine Other (See Comments)    Headache    Medications Reviewed Today     Reviewed by Wayne Medicus, RN (Registered Nurse) on 02/14/22 at [phone]  Med List Status: <None>   Medication Order Taking? Sig Documenting Provider Last Dose Status Informant  allopurinol (ZYLOPRIM) 300 MG tablet [id number]  Take 1 tablet (300 mg total) by mouth every morning. Wayne Perna, NP  Active   amLODipine (NORVASC) 10 MG tablet [id number]  Take 1 tablet (10 mg total) by mouth daily. Wayne Perna, NP  Active   atorvastatin (LIPITOR) 40 MG tablet [id number]  Take 1 tablet (40 mg total) by mouth daily. Wayne Perna, NP  Active   chlorthalidone (HYGROTON) 25 MG tablet [id number]  TAKE ONE TABLET BY MOUTH ONCE DAILY Wayne Perna, NP  Active   ibuprofen (ADVIL) 800 MG tablet [id number]  Take 1 tablet (800 mg total) by mouth every 8 (eight) hours as needed for moderate pain. Wayne Perna, NP  Active   lidocaine (LIDODERM) 5 % [id number]  Place 1 patch onto the skin daily. Remove & Discard patch within 12 hours or as directed by MD Wayne Perna, NP  Active   losartan (COZAAR) 100 MG tablet [id number]  TAKE ONE TABLET BY MOUTH EVERY  Wayne Morale, NP  Active   tadalafil (CIALIS) 10 MG tablet [id number] No Take 1 tablet (10 mg total) by mouth every other day as needed for erectile dysfunction. Wayne Perna, NP Taking Active   Med List  Note Wayne Perna, NP 06/27/20 1021):               Patient Active Problem List   Diagnosis Date Noted   Primary osteoarthritis of left knee 08/22/2021   Primary osteoarthritis of right knee 08/22/2021   Prostate cancer ([place]) 04/04/2020   Malignant neoplasm of prostate ([place]) 01/17/2020   Acute gout due to renal impairment involving hand 08/12/2017   Essential hypertension 08/12/2017   Unilateral primary osteoarthritis, left knee 05/25/2016   Unilateral primary osteoarthritis, right knee 05/25/2016   Conditions to be addressed/monitored per PCP order:  Chronic healthcare management needs, gout, tobacco use, HTN, h/o prostate cancer  Care Plan : General Plan of Care (Adult)  Updates made by Wayne Medicus, RN since 02/14/2022 12:00 AM     Problem: Health Promotion or Disease Self-Management (General Plan of Care)   Priority: Medium  Onset Date: 04/25/2020     Long-Range Goal: Self-Management Plan Developed   Start Date: 04/25/2020  Expected End Date: 04/12/2022  Recent Progress: On track  Priority: Medium  Note:    Current Barriers:   Knowledge Deficits related to short term plan for care coordination needs and long term plans for chronic disease management, HTN, gout, h/o prostate cancer, tobacco use 02/14/22:  Patient has PCP appt today and will discuss smoking-smoking 8-10 cigarettes a day.  Not checking BP currently.  Gout flare up with colder weather but better now.  Wayne [name] follow up in June.  Nurse Case Manager Clinical Goal(s):  patient will work with care management team to address care coordination and chronic disease management needs related to Disease Management Educational Needs Care Coordination Medication Management and Education Medication Reconciliation Medication Assistance  Psychosocial Support   Interventions:  Evaluation of current treatment plan and patient's adherence to plan as established by provider. Provided education to patient re:  post surgical pain. Reviewed medications with patient.   Wayne [name] with Pharmacy  regarding medications. Discussed plans with patient for ongoing care management follow up and provided patient with direct contact information for care management team Reviewed scheduled/upcoming provider appointments.  Pharmacy referral for medication review-Pharmacist following Collaborated with PCP for nicotine patch to stop smoking-completed. 01/11/22:   Patient smoking 8-10  cigarettes a day. Has resources. Self Care Activities:  Patient will self administer medications as prescribed Patient will attend all scheduled provider appointments Patient will call pharmacy for medication refills Patient will call provider office for new concerns or questions Patient will contact insurance provider for coverage of incontinence pads. Patient Goals: Over the next 30 days, patient will attend all provider appointments. Over the next 30 days, patient will start nicotine patch.-completed Over the next 30 days, patient will speak with Pharmacist regarding medications. Over the next 30 days, patient will check blood pressure on a regular basis. . Follow Up Plan: The care management team will reach out to the patient again over the next 30 business  days.     Follow Up:  Patient agrees to Care Plan and Follow-up.  Plan: The Managed Medicaid care management team will reach out to the patient again over the next 30 business  days. and The  Patient has been provided with contact information for the Managed Medicaid care management  team and has been advised to call with any health related questions or concerns.  Date/time of next scheduled RN care management/care coordination outreach: 03/14/22 at 1030.

## 2022-02-15 LAB — CBC WITH DIFFERENTIAL/PLATELET
Basophils Absolute: 0 10*3/uL (ref 0.0–0.2)
Basos: 0 %
EOS (ABSOLUTE): 0.1 10*3/uL (ref 0.0–0.4)
Eos: 3 %
Hematocrit: 39.9 % (ref 37.5–51.0)
Hemoglobin: 13.5 g/dL (ref 13.0–17.7)
Immature Grans (Abs): 0 10*3/uL (ref 0.0–0.1)
Immature Granulocytes: 0 %
Lymphocytes Absolute: 2.2 10*3/uL (ref 0.7–3.1)
Lymphs: 41 %
MCH: 29.5 pg (ref 26.6–33.0)
MCHC: 33.8 g/dL (ref 31.5–35.7)
MCV: 87 fL (ref 79–97)
Monocytes Absolute: 0.3 10*3/uL (ref 0.1–0.9)
Monocytes: 7 %
Neutrophils Absolute: 2.6 10*3/uL (ref 1.4–7.0)
Neutrophils: 49 %
Platelets: 323 10*3/uL (ref 150–450)
RBC: 4.58 x10E6/uL (ref 4.14–5.80)
RDW: 14.5 % (ref 11.6–15.4)
WBC: 5.3 10*3/uL (ref 3.4–10.8)

## 2022-02-15 LAB — CMP14+EGFR
ALT: 18 IU/L (ref 0–44)
AST: 17 IU/L (ref 0–40)
Albumin/Globulin Ratio: 1.4 (ref 1.2–2.2)
Albumin: 4.3 g/dL (ref 3.9–4.9)
Alkaline Phosphatase: 127 IU/L — ABNORMAL HIGH (ref 44–121)
BUN/Creatinine Ratio: 13 (ref 10–24)
BUN: 11 mg/dL (ref 8–27)
Bilirubin Total: 0.3 mg/dL (ref 0.0–1.2)
CO2: 25 mmol/L (ref 20–29)
Calcium: 9.4 mg/dL (ref 8.6–10.2)
Chloride: 103 mmol/L (ref 96–106)
Creatinine, Ser: 0.82 mg/dL (ref 0.76–1.27)
Globulin, Total: 3 g/dL (ref 1.5–4.5)
Glucose: 124 mg/dL — ABNORMAL HIGH (ref 70–99)
Potassium: 3.7 mmol/L (ref 3.5–5.2)
Sodium: 144 mmol/L (ref 134–144)
Total Protein: 7.3 g/dL (ref 6.0–8.5)
eGFR: 100 mL/min/{1.73_m2} (ref >59)

## 2022-02-15 LAB — LIPID PANEL
Chol/HDL Ratio: 3.7 ratio (ref 0.0–5.0)
Cholesterol, Total: 130 mg/dL (ref 100–199)
HDL: 35 mg/dL — ABNORMAL LOW (ref >39)
LDL Chol Calc (NIH): 63 mg/dL (ref 0–99)
Triglycerides: 190 mg/dL — ABNORMAL HIGH (ref 0–149)
VLDL Cholesterol Cal: 32 mg/dL (ref 5–40)

## 2022-02-15 LAB — HEMOGLOBIN A1C
Est. average glucose Bld gHb Est-mCnc: 140 mg/dL
Hgb A1c MFr Bld: 6.5 % — ABNORMAL HIGH (ref 4.8–5.6)

## 2022-02-15 NOTE — Telephone Encounter (Signed)
Requested Prescriptions  Pending Prescriptions Disp Refills   losartan (COZAAR) 100 MG tablet [Pharmacy Med Name: losartan 100 mg tablet] 90 tablet 1    Sig: TAKE ONE TABLET BY MOUTH EVERY EVENING     Cardiovascular:  Angiotensin Receptor Blockers Failed - 02/14/2022  4:53 PM      Failed - Cr in normal range and within 180 days    Creatinine, Ser  Date Value Ref Range Status  02/14/2022 0.82 0.76 - 1.27 mg/dL Final         Failed - K in normal range and within 180 days    Potassium  Date Value Ref Range Status  02/14/2022 3.7 3.5 - 5.2 mmol/L Final         Passed - Patient is not pregnant      Passed - Last BP in normal range    BP Readings from Last 1 Encounters:  02/14/22 131/81         Passed - Valid encounter within last 6 months    Recent Outpatient Visits           Yesterday Mixed hyperlipidemia   [place] [facility] [name], NP   6 months ago Medication refill   [place], [place] P, NP   9 months ago Blood pressure check   [place] [facility] [name], NP   11 months ago Essential hypertension   [place], [place] P, NP   1 year ago Essential hypertension   [place], [place], NP       Future Appointments             In 6 months Oletta Lamas, [place] Cage, NP [place]

## 2022-02-20 ENCOUNTER — Other Ambulatory Visit (INDEPENDENT_AMBULATORY_CARE_PROVIDER_SITE_OTHER): Payer: Self-pay | Admitting: Primary Care

## 2022-02-20 MED ORDER — GEMFIBROZIL 600 MG PO TABS: 600.0000 mg | ORAL_TABLET | Freq: Two times a day (BID) | ORAL | 1 refills | Status: DC

## 2022-03-14 ENCOUNTER — Other Ambulatory Visit: Payer: Medicaid Other | Admitting: Obstetrics and Gynecology

## 2022-03-14 ENCOUNTER — Encounter: Payer: Self-pay | Admitting: Obstetrics and Gynecology

## 2022-03-14 NOTE — Patient Outreach (Signed)
Medicaid Managed Care   Nurse Care Manager Note  03/14/2022 Name:  Wayne [name] MRN:  [id number] DOB:  12-Dec-1960  Wayne [name] is an 62 y.o. year old male who is a primary patient of Wayne Perna, NP.  The [facility] Managed Care Coordination team was consulted for assistance with:    Chronic healthcare management needs, gout, HTN, tobacco use, h/o prostate cancer  Wayne [name] was given information about Medicaid Managed Care Coordination team services today. Wayne [name] Patient agreed to services and verbal consent obtained.  Engaged with patient by telephone for follow up visit in response to provider referral for case management and/or care coordination services.   Assessments/Interventions:  Review of past medical history, allergies, medications, health status, including review of consultants reports, laboratory and other test data, was performed as part of comprehensive evaluation and provision of chronic care management services.  SDOH (Social Determinants of Health) assessments and interventions performed: SDOH Interventions    Flowsheet Row Patient Outreach Telephone from 03/14/2022 in [place] Patient Outreach Telephone from 01/11/2022 in [place] Patient Outreach Telephone from 12/11/2021 in [place] Patient Outreach Telephone from 11/10/2021 in [place] Coordination Patient Outreach Telephone from 10/09/2021 in [place] Coordination Patient Outreach Telephone from 08/30/2021 in [place] Interventions        Food Insecurity Interventions Intervention Not Indicated -- -- -- -- Intervention Not Indicated  Housing Interventions Intervention Not Indicated -- -- -- Intervention Not Indicated --  Transportation Interventions -- Intervention Not Indicated -- -- -- --   Utilities Interventions -- -- -- -- Intervention Not Indicated --  Alcohol Usage Interventions -- -- Intervention Not Indicated (Score <7) -- -- --  Financial Strain Interventions -- -- -- Intervention Not Indicated -- --  Physical Activity Interventions -- -- -- Intervention Not Indicated  [knee pain, gout, osteoarthritis limits his activity] -- --  Stress Interventions -- -- Intervention Not Indicated -- -- --  Social Connections Interventions -- Intervention Not Indicated -- -- -- --     Care Plan  Allergies  Allergen Reactions   Hydralazine Other (See Comments)    Headache    Medications Reviewed Today     Reviewed by Wayne Medicus, RN (Registered Nurse) on 03/14/22 at 1042  Med List Status: <None>   Medication Order Taking? Sig Documenting Provider Last Dose Status Informant  allopurinol (ZYLOPRIM) 300 MG tablet [id number]  Take 1 tablet (300 mg total) by mouth every morning. Wayne Perna, NP  Active   chlorthalidone (HYGROTON) 25 MG tablet [id number]  Take 1 tablet (25 mg total) by mouth daily. Wayne Perna, NP  Active   gemfibrozil (LOPID) 600 MG tablet [id number]  Take 1 tablet (600 mg total) by mouth 2 (two) times daily before a meal. Wayne Perna, NP  Active   ibuprofen (ADVIL) 800 MG tablet [id number]  Take 1 tablet (800 mg total) by mouth every 8 (eight) hours as needed for moderate pain. Wayne Perna, NP  Active   lidocaine (LIDODERM) 5 % [id number]  Place 1 patch onto the skin daily. Remove & Discard patch within 12 hours or as directed by MD Wayne Perna, NP  Active   losartan (COZAAR) 100 MG tablet [id number]  TAKE ONE TABLET BY MOUTH EVERY Laurey Morale, NP  Active   tadalafil (CIALIS) 10 MG tablet [id number]  No Take 1 tablet (10 mg total) by mouth every other day as needed for erectile dysfunction. Wayne Perna, NP Taking Active   Med List Note Wayne Perna, NP 06/27/20 1021):               Patient  Active Problem List   Diagnosis Date Noted   Primary osteoarthritis of left knee 08/22/2021   Primary osteoarthritis of right knee 08/22/2021   Prostate cancer ([place]) 04/04/2020   Malignant neoplasm of prostate ([place]) 01/17/2020   Acute gout due to renal impairment involving hand 08/12/2017   Essential hypertension 08/12/2017   Unilateral primary osteoarthritis, left knee 05/25/2016   Unilateral primary osteoarthritis, right knee 05/25/2016   Conditions to be addressed/monitored per PCP order:  Chronic healthcare management needs, gout, HTN, tobacco use, h/o prostate cancer, osteoarthritis  Care Plan : General Plan of Care (Adult)  Updates made by Wayne Medicus, RN since 03/14/2022 12:00 AM     Problem: Health Promotion or Disease Self-Management (General Plan of Care)   Priority: Medium  Onset Date: 04/25/2020     Long-Range Goal: Self-Management Plan Developed   Start Date: 04/25/2020  Expected End Date: 06/12/2022  Recent Progress: On track  Priority: Medium  Note:    Current Barriers:   Knowledge Deficits related to short term plan for care coordination needs and long term plans for chronic disease management, HTN, gout, h/o prostate cancer, tobacco use 03/14/22:  Patient seen and evaluated by PCP in January.  BP 131/81 at that visit-does not check.  Smokes 10 cigarettes a day-no desire to stop right now.  Joint pain on and off-managed with diet and meds.  Nurse Case Manager Clinical Goal(s):  patient will work with care management team to address care coordination and chronic disease management needs related to Disease Management Educational Needs Care Coordination Medication Management and Education Medication Reconciliation Medication Assistance  Psychosocial Support   Interventions:  Evaluation of current treatment plan and patient's adherence to plan as established by provider. Provided education to patient re: post surgical pain. Reviewed medications with patient.    Wayne [name] with Pharmacy  regarding medications. Discussed plans with patient for ongoing care management follow up and provided patient with direct contact information for care management team Reviewed scheduled/upcoming provider appointments.  Pharmacy referral for medication review-Pharmacist following Collaborated with PCP for nicotine patch to stop smoking-completed. 01/11/22:   Patient smoking 8-10  cigarettes a day. Has resources.  Hypertension Interventions:  (Status:  New goal.) Long Term Goal Last practice recorded BP readings:  BP Readings from Last 3 Encounters:  02/14/22 131/81  07/31/21 125/85  03/13/21 120/84   Most recent eGFR/CrCl:  Lab Results  Component Value Date   EGFR 100 02/14/2022    No components found for: "CRCL"  Evaluation of current treatment plan related to hypertension self management and patient's adherence to plan as established by provider Discussed plans with patient for ongoing care management follow up and provided patient with direct contact information for care management team Advised patient, providing education and rationale, to monitor blood pressure daily and record, calling PCP for findings outside established parameters Reviewed scheduled/upcoming provider appointments including:  Assessed social determinant of health barriers  Smoking Cessation Interventions:  (Status:  New goal.) Long Term Goal Reviewed smoking history:  currently smoking 10 cigarettes a day Evaluation of current treatment plan reviewed Advised patient to discuss smoking cessation options with provider Reviewed scheduled/upcoming provider appointments  Provided contact information for [place] Quit Line (1-800-QUIT-NOW)  Discussed plans with patient for ongoing care management follow up and provided patient with direct contact information for care management team Assessed social determinant of health barriers  Self Care Activities:  Patient will self administer  medications as prescribed Patient will attend all scheduled provider appointments Patient will call pharmacy for medication refills Patient will call provider office for new concerns or questions Patient will contact insurance provider for coverage of incontinence pads. Patient Goals: Over the next 30 days, patient will attend all provider appointments. Over the next 30 days, patient will start nicotine patch.-completed Over the next 30 days, patient will speak with Pharmacist regarding medications. Over the next 30 days, patient will check blood pressure on a regular basis. . Follow Up Plan: The care management team will reach out to the patient again over the next 60 business  days.    Follow Up:  Patient agrees to Care Plan and Follow-up.  Plan: The Managed Medicaid care management team will reach out to the patient again over the next 30 business  days. and The  Patient has been provided with contact information for the Managed Medicaid care management team and has been advised to call with any health related questions or concerns.  Date/time of next scheduled RN care management/care coordination outreach: 05/11/22 at 1230.

## 2022-03-14 NOTE — Patient Instructions (Signed)
Hi Wayne [name] for speaking with me-I will check back with you in April-have a great day!!  Wayne [name] was given information about Medicaid Managed Care team care coordination services as a part of their [place] Medicaid benefit. Wayne [name] verbally consented to engagement with the [facility] Managed Care team.   If you are experiencing a medical emergency, please call 911 or report to your local emergency department or urgent care.   If you have a non-emergency medical problem during routine business hours, please contact your provider's office and ask to speak with a nurse.   For questions related to your [facility], please call: [phone] or visit the homepage here: [email or web address]  If you would like to schedule transportation through your [facility], please call the following number at least 2 days in advance of your appointment: [phone]   Rides for urgent appointments can also be made after hours by calling Member Services.  Call the [place] at [phone], at any time, 24 hours a day, 7 days a week. If you are in danger or need immediate medical attention call 911.  If you would like help to quit smoking, call 1-800-QUIT-NOW 519-449-1329) OR Espaol: 1-855-Djelo-Ya HD:1601594) o para ms informacin haga clic aqu or Text READY to 200-400 to register via text  Wayne [name] - following are the goals we discussed in your visit today:   Goals Addressed             This Visit's Progress    Disease Progression Prevented or Minimized       03/14/22: Urology appt with [name] WNL-follow up in June   Patient verbalizes understanding of instructions and care plan provided today and agrees to view in [place]. Active MyChart status and patient understanding of how to access instructions and care plan via MyChart  confirmed with patient.     The Managed Medicaid care management team will reach out to the patient again over the next 30 business  days.  The  Patient  has been provided with contact information for the Managed Medicaid care management team and has been advised to call with any health related questions or concerns.   [name] RN, BSN [place] Management Coordinator - Managed Medicaid High Risk [phone]   Following is a copy of your plan of care:  Care Plan : General Plan of Care (Adult)  Updates made by Gayla Medicus, RN since 03/14/2022 12:00 AM     Problem: Health Promotion or Disease Self-Management (General Plan of Care)   Priority: Medium  Onset Date: 04/25/2020     Long-Range Goal: Self-Management Plan Developed   Start Date: 04/25/2020  Expected End Date: 06/12/2022  Recent Progress: On track  Priority: Medium  Note:    Current Barriers:   Knowledge Deficits related to short term plan for care coordination needs and long term plans for chronic disease management, HTN, gout, h/o prostate cancer, tobacco use 03/14/22:  Patient seen and evaluated by PCP in January.  BP 131/81 at that visit-does not check.  Smokes 10 cigarettes a day-no desire to stop right now.  Joint pain on and off-managed with diet and meds.  Nurse Case Manager Clinical Goal(s):  patient will work with care management team to address care coordination and chronic disease management needs related to Disease Management Educational Needs Care Coordination Medication Management and Education Medication Reconciliation Medication Assistance  Psychosocial Support   Interventions:  Evaluation of current treatment plan and patient's adherence to plan as established by provider. Provided education to patient re: post surgical pain. Reviewed medications with patient.   [name] with Pharmacy  regarding medications. Discussed plans with patient for ongoing care  management follow up and provided patient with direct contact information for care management team Reviewed scheduled/upcoming provider appointments.  Pharmacy referral for medication review-Pharmacist following Collaborated with PCP for nicotine patch to stop smoking-completed. 01/11/22:   Patient smoking 8-10  cigarettes a day. Has resources.  Hypertension Interventions:  (Status:  New goal.) Long Term Goal Last practice recorded BP readings:  BP Readings from Last 3 Encounters:  02/14/22 131/81  07/31/21 125/85  03/13/21 120/84   Most recent eGFR/CrCl:  Lab Results  Component Value Date   EGFR 100 02/14/2022    No components found for: "CRCL"  Evaluation of current treatment plan related to hypertension self management and patient's adherence to plan as established by provider Discussed plans with patient for ongoing care management follow up and provided patient with direct contact information for care management team Advised patient, providing education and rationale, to monitor blood pressure daily and record, calling PCP for findings outside established parameters Reviewed scheduled/upcoming provider appointments including:  Assessed social determinant of health barriers  Smoking Cessation Interventions:  (Status:  New goal.) Long Term Goal Reviewed smoking history:  currently smoking 10 cigarettes a day Evaluation of current treatment plan reviewed Advised patient to discuss smoking cessation options with provider Reviewed scheduled/upcoming provider appointments  Provided contact information for [place] Quit Line (1-800-QUIT-NOW) Discussed plans with patient for ongoing care management follow up and provided patient with direct contact information for care management team Assessed social determinant of health barriers  Self Care Activities:  Patient will self administer medications as prescribed Patient will attend all scheduled provider appointments Patient will call  pharmacy for medication refills Patient will call provider office for new concerns or questions Patient will contact insurance provider for coverage of incontinence pads. Patient Goals: Over the next 30 days, patient will attend all provider appointments. Over the next 30 days, patient will start nicotine patch.-completed Over the next 30 days, patient will speak with Pharmacist regarding medications. Over the next 30 days, patient will check blood pressure on a regular basis. . Follow Up Plan: The care management team will reach out to the patient again over the next 60 business  days.

## 2022-05-11 ENCOUNTER — Other Ambulatory Visit (INDEPENDENT_AMBULATORY_CARE_PROVIDER_SITE_OTHER): Payer: Self-pay | Admitting: Primary Care

## 2022-05-11 ENCOUNTER — Encounter: Payer: Self-pay | Admitting: Obstetrics and Gynecology

## 2022-05-11 ENCOUNTER — Other Ambulatory Visit: Payer: Medicaid Other | Admitting: Obstetrics and Gynecology

## 2022-05-11 DIAGNOSIS — I10 Essential (primary) hypertension: Secondary | ICD-10-CM

## 2022-05-11 NOTE — Patient Outreach (Signed)
Medicaid Managed Care   Nurse Care Manager Note  05/11/2022 Name:  TRAMAR BRUECKNER MRN:  [id number] DOB:  [birth year]  [name] is an 62 y.o. year old male who is a primary patient of [name], NP.  The [facility] Managed Care Coordination team was consulted for assistance with:    Chronic healthcare management needs, gout, HTN, tobacco use, h/o prostate cancer , osteoarthritis  Mr. Ander was given information about Medicaid Managed Care Coordination team services today. [name] Patient agreed to services and verbal consent obtained.  Engaged with patient by telephone for follow up visit in response to provider referral for case management and/or care coordination services.   Assessments/Interventions:  Review of past medical history, allergies, medications, health status, including review of consultants reports, laboratory and other test data, was performed as part of comprehensive evaluation and provision of chronic care management services.  SDOH (Social Determinants of Health) assessments and interventions performed: SDOH Interventions    Flowsheet Row Patient Outreach Telephone from 05/11/2022 in [place] POPULATION HEALTH DEPARTMENT Patient Outreach Telephone from 03/14/2022 in [place] POPULATION HEALTH DEPARTMENT Patient Outreach Telephone from 01/11/2022 in [place] POPULATION HEALTH DEPARTMENT Patient Outreach Telephone from 12/11/2021 in [place] POPULATION HEALTH DEPARTMENT Patient Outreach Telephone from 11/10/2021 in [facility] Care Coordination Patient Outreach Telephone from 10/09/2021 in [facility] Care Coordination  SDOH Interventions        Food Insecurity Interventions -- Intervention Not Indicated -- -- -- --  Housing Interventions -- Intervention Not Indicated -- -- -- Intervention Not Indicated  Transportation Interventions -- -- Intervention Not Indicated -- -- --  Utilities Interventions  Intervention Not Indicated -- -- -- -- Intervention Not Indicated  Alcohol Usage Interventions -- -- -- Intervention Not Indicated (Score <7) -- --  Financial Strain Interventions Intervention Not Indicated -- -- -- Intervention Not Indicated --  Physical Activity Interventions -- -- -- -- Intervention Not Indicated  [knee pain, gout, osteoarthritis limits his activity] --  Stress Interventions -- -- -- Intervention Not Indicated -- --  Social Connections Interventions -- -- Intervention Not Indicated -- -- --     Care Plan  Allergies  Allergen Reactions   Hydralazine Other (See Comments)    Headache    Medications Reviewed Today     Reviewed by [name], RN (Registered Nurse) on 05/11/22 at 1229  Med List Status: <None>   Medication Order Taking? Sig Documenting Provider Last Dose Status Informant  allopurinol (ZYLOPRIM) 300 MG tablet [id number]  Take 1 tablet (300 mg total) by mouth every morning. [name], NP  Active   chlorthalidone (HYGROTON) 25 MG tablet [id number]  Take 1 tablet (25 mg total) by mouth daily. [name], NP  Active   gemfibrozil (LOPID) 600 MG tablet [id number]  Take 1 tablet (600 mg total) by mouth 2 (two) times daily before a meal. [name], NP  Active   ibuprofen (ADVIL) 800 MG tablet [id number]  Take 1 tablet (800 mg total) by mouth every 8 (eight) hours as needed for moderate pain. [name], NP  Active   lidocaine (LIDODERM) 5 % [id number]  Place 1 patch onto the skin daily. Remove & Discard patch within 12 hours or as directed by MD [name], NP  Active   losartan (COZAAR) 100 MG tablet [id number]  TAKE ONE TABLET BY MOUTH EVERY EVENING [name], NP  Active   tadalafil (CIALIS) 10  MG tablet [id number] No Take 1 tablet (10 mg total) by mouth every other day as needed for erectile dysfunction. [name], NP Taking Active   Med List Note [name], NP 06/27/20 1021):                Patient Active Problem List   Diagnosis Date Noted   Primary osteoarthritis of left knee 08/22/2021   Primary osteoarthritis of right knee 08/22/2021   Prostate cancer 04/04/2020   Malignant neoplasm of prostate 01/17/2020   Acute gout due to renal impairment involving hand 08/12/2017   Essential hypertension 08/12/2017   Unilateral primary osteoarthritis, left knee 05/25/2016   Unilateral primary osteoarthritis, right knee 05/25/2016   Conditions to be addressed/monitored per PCP order:  Chronic healthcare management needs, gout, HTN, tobacco use, h/o prostate cancer, osteoarthritis  Care Plan : General Plan of Care (Adult)  Updates made by [name], RN since 05/11/2022 12:00 AM     Problem: Health Promotion or Disease Self-Management (General Plan of Care)   Priority: Medium  Onset Date: 04/25/2020     Long-Range Goal: Self-Management Plan Developed   Start Date: 04/25/2020  Expected End Date: 08/10/2022  Recent Progress: On track  Priority: Medium  Note:    Current Barriers:   Knowledge Deficits related to short term plan for care coordination needs and long term plans for chronic disease management, HTN, gout, h/o prostate cancer, tobacco use 05/11/22:  No complaints today, smoking 8 cigarettes a day-to f/u with PCP regarding smoking cessation.  URO appt in June.  BP stable.  Nurse Case Manager Clinical Goal(s):  patient will work with care management team to address care coordination and chronic disease management needs related to Disease Management Educational Needs Care Coordination Medication Management and Education Medication Reconciliation Medication Assistance  Psychosocial Support   Interventions:  Evaluation of current treatment plan and patient's adherence to plan as established by provider. Provided education to patient re: post surgical pain. Reviewed medications with patient.   [name] with Pharmacy  regarding medications. Discussed  plans with patient for ongoing care management follow up and provided patient with direct contact information for care management team Reviewed scheduled/upcoming provider appointments.  Pharmacy referral for medication review-Pharmacist following Collaborated with PCP for nicotine patch to stop smoking-completed.  Hypertension Interventions:  (Status:  New goal.) Long Term Goal Last practice recorded BP readings:  BP Readings from Last 3 Encounters:  02/14/22 131/81  07/31/21 125/85  03/13/21 120/84  05/11/22         122/82  Most recent eGFR/CrCl:  Lab Results  Component Value Date   EGFR 100 02/14/2022    No components found for: "CRCL"  Evaluation of current treatment plan related to hypertension self management and patient's adherence to plan as established by provider Discussed plans with patient for ongoing care management follow up and provided patient with direct contact information for care management team Advised patient, providing education and rationale, to monitor blood pressure daily and record, calling PCP for findings outside established parameters Reviewed scheduled/upcoming provider appointments including:  Assessed social determinant of health barriers  Smoking Cessation Interventions:  (Status:  New goal.) Long Term Goal Reviewed smoking history:  currently smoking 8 cigarettes a day Evaluation of current treatment plan reviewed Advised patient to discuss smoking cessation options with provider Reviewed scheduled/upcoming provider appointments  Provided contact information for [place] Quit Line (1-800-QUIT-NOW) Discussed plans with patient for ongoing care management follow up and provided patient with direct contact  information for care management team Assessed social determinant of health barriers  Self Care Activities:  Patient will self administer medications as prescribed Patient will attend all scheduled provider appointments Patient will call pharmacy for  medication refills Patient will call provider office for new concerns or questions Patient will contact insurance provider for coverage of incontinence pads. Patient Goals: Over the next 30 days, patient will attend all provider appointments. Over the next 30 days, patient will start nicotine patch.-completed Over the next 30 days, patient will speak with Pharmacist regarding medications. Over the next 30 days, patient will check blood pressure on a regular basis. . Follow Up Plan: The care management team will reach out to the patient again over the next 60 business  days.    Follow Up:  Patient agrees to Care Plan and Follow-up.  Plan: The Managed Medicaid care management team will reach out to the patient again over the next 60 business  days. and The  Patient has been provided with contact information for the Managed Medicaid care management team and has been advised to call with any health related questions or concerns.  Date/time of next scheduled RN care management/care coordination outreach: 07/11/22 at 1030.

## 2022-05-11 NOTE — Telephone Encounter (Signed)
Rx was sent to [facility] on 02/14/22 #90/1.   Requested Prescriptions  Pending Prescriptions Disp Refills   chlorthalidone (HYGROTON) 25 MG tablet [Pharmacy Med Name: chlorthalidone 25 mg tablet] 90 tablet 0    Sig: TAKE ONE TABLET BY MOUTH ONCE DAILY     Cardiovascular: Diuretics - Thiazide Passed - 05/11/2022  4:10 PM      Passed - Cr in normal range and within 180 days    Creatinine, Ser  Date Value Ref Range Status  02/14/2022 0.82 0.76 - 1.27 mg/dL Final         Passed - K in normal range and within 180 days    Potassium  Date Value Ref Range Status  02/14/2022 3.7 3.5 - 5.2 mmol/L Final         Passed - Na in normal range and within 180 days    Sodium  Date Value Ref Range Status  02/14/2022 144 134 - 144 mmol/L Final         Passed - Last BP in normal range    BP Readings from Last 1 Encounters:  02/14/22 131/81         Passed - Valid encounter within last 6 months    Recent Outpatient Visits           2 months ago Mixed hyperlipidemia   [place] [facility] [name], NP   9 months ago Medication refill   [place] [facility] [name], NP   1 year ago Blood pressure check   [place] [facility] [name], NP   1 year ago Essential hypertension   [place] [facility] [name], NP   1 year ago Essential hypertension   [place] [facility] [name], NP       Future Appointments             In 3 months Randa Evens, Kinnie Scales, NP  [facility]

## 2022-05-11 NOTE — Patient Instructions (Signed)
Hi [name], thank you for speaking with me-I hope you have a great afternoon and weekend!  [name] was given information about Medicaid Managed Care team care coordination services as a part of their [name] Community Plan Medicaid benefit. Wayne [name] verbally consented to engagement with the [facility] Managed Care team.   If you are experiencing a medical emergency, please call 911 or report to your local emergency department or urgent care.   If you have a non-emergency medical problem during routine business hours, please contact your provider's office and ask to speak with a nurse.   For questions related to your [facility], please call: [phone] or visit the homepage here: [email or web address]  If you would like to schedule transportation through your [facility], please call the following number at least 2 days in advance of your appointment: [phone]   Rides for urgent appointments can also be made after hours by calling Member Services.  Call the Behavioral Health Crisis Line at [phone], at any time, 24 hours a day, 7 days a week. If you are in danger or need immediate medical attention call 911.  If you would like help to quit smoking, call 1-800-QUIT-NOW (581-620-5010) OR Espaol: 1-855-Djelo-Ya (4-010-272-5366) o para ms informacin haga clic aqu or Text READY to 440-347 to register via text  [name] - following are the goals we discussed in your visit today:   Goals Addressed             This Visit's Progress    Disease Progression Prevented or Minimized       05/11/22:  Patient has appt with Dr. Laverle Patter in June   Patient verbalizes understanding of instructions and care plan provided today and agrees to view in MyChart. Active MyChart status and patient understanding of how to access instructions and care plan via MyChart confirmed  with patient.     The Managed Medicaid care management team will reach out to the patient again over the next 60 business  days.  The  Patient has been provided with contact information for the Managed Medicaid care management team and has been advised to call with any health related questions or concerns.   Wayne Der RN, BSN [name]  [facility] Care Management Coordinator - Managed Medicaid High Risk [phone]   Following is a copy of your plan of care:  Care Plan : General Plan of Care (Adult)  Updates made by [name], RN since 05/11/2022 12:00 AM     Problem: Health Promotion or Disease Self-Management (General Plan of Care)   Priority: Medium  Onset Date: 04/25/2020     Long-Range Goal: Self-Management Plan Developed   Start Date: 04/25/2020  Expected End Date: 08/10/2022  Recent Progress: On track  Priority: Medium  Note:    Current Barriers:   Knowledge Deficits related to short term plan for care coordination needs and long term plans for chronic disease management, HTN, gout, h/o prostate cancer, tobacco use 05/11/22:  No complaints today, smoking 8 cigarettes a day-to f/u with PCP regarding smoking cessation.  URO appt in June.  BP stable.  Nurse Case Manager Clinical Goal(s):  patient will work with care management team to address care coordination and chronic disease management needs related to Disease Management Educational Needs Care Coordination Medication Management and Education Medication Reconciliation Medication Assistance  Psychosocial Support   Interventions:  Evaluation of current treatment plan and patient's adherence to plan  as established by provider. Provided education to patient re: post surgical pain. Reviewed medications with patient.   [name] with Pharmacy  regarding medications. Discussed plans with patient for ongoing care management follow up and provided patient with direct contact information for care  management team Reviewed scheduled/upcoming provider appointments.  Pharmacy referral for medication review-Pharmacist following Collaborated with PCP for nicotine patch to stop smoking-completed.  Hypertension Interventions:  (Status:  New goal.) Long Term Goal Last practice recorded BP readings:  BP Readings from Last 3 Encounters:  02/14/22 131/81  07/31/21 125/85  03/13/21 120/84  05/11/22         122/82  Most recent eGFR/CrCl:  Lab Results  Component Value Date   EGFR 100 02/14/2022    No components found for: "CRCL"  Evaluation of current treatment plan related to hypertension self management and patient's adherence to plan as established by provider Discussed plans with patient for ongoing care management follow up and provided patient with direct contact information for care management team Advised patient, providing education and rationale, to monitor blood pressure daily and record, calling PCP for findings outside established parameters Reviewed scheduled/upcoming provider appointments including:  Assessed social determinant of health barriers  Smoking Cessation Interventions:  (Status:  New goal.) Long Term Goal Reviewed smoking history:  currently smoking 8 cigarettes a day Evaluation of current treatment plan reviewed Advised patient to discuss smoking cessation options with provider Reviewed scheduled/upcoming provider appointments  Provided contact information for [place] Quit Line (1-800-QUIT-NOW) Discussed plans with patient for ongoing care management follow up and provided patient with direct contact information for care management team Assessed social determinant of health barriers  Self Care Activities:  Patient will self administer medications as prescribed Patient will attend all scheduled provider appointments Patient will call pharmacy for medication refills Patient will call provider office for new concerns or questions Patient will contact insurance  provider for coverage of incontinence pads. Patient Goals: Over the next 30 days, patient will attend all provider appointments. Over the next 30 days, patient will start nicotine patch.-completed Over the next 30 days, patient will speak with Pharmacist regarding medications. Over the next 30 days, patient will check blood pressure on a regular basis. . Follow Up Plan: The care management team will reach out to the patient again over the next 60 business  days.

## 2022-05-17 ENCOUNTER — Other Ambulatory Visit (INDEPENDENT_AMBULATORY_CARE_PROVIDER_SITE_OTHER): Payer: Self-pay | Admitting: Primary Care

## 2022-05-17 ENCOUNTER — Telehealth (INDEPENDENT_AMBULATORY_CARE_PROVIDER_SITE_OTHER): Payer: Self-pay | Admitting: Primary Care

## 2022-05-17 DIAGNOSIS — I10 Essential (primary) hypertension: Secondary | ICD-10-CM

## 2022-05-17 MED ORDER — CHLORTHALIDONE 25 MG PO TABS: 25.0000 mg | ORAL_TABLET | Freq: Every day | ORAL | 0 refills | Status: DC

## 2022-05-17 NOTE — Telephone Encounter (Signed)
Medication Refill - Medication: Chlorthalidone 25 mg  Has the patient contacted their pharmacy? Yes.   (Agent: If no, request that the patient contact the pharmacy for the refill. If patient does not wish to contact the pharmacy document the reason why and proceed with request.) (Agent: If yes, when and what did the pharmacy advise?)  Preferred Pharmacy (with phone number or street name): [facility]  Wayne [name] Has the patient been seen for an appointment in the last year OR does the patient have an upcoming appointment? y  Agent: Please be advised that RX refills may take up to 3 business days. We ask that you follow-up with your pharmacy.

## 2022-05-17 NOTE — Telephone Encounter (Signed)
Requested Prescriptions  Pending Prescriptions Disp Refills   chlorthalidone (HYGROTON) 25 MG tablet 90 tablet 0    Sig: Take 1 tablet (25 mg total) by mouth daily.     Cardiovascular: Diuretics - Thiazide Passed - 05/17/2022 12:20 PM      Passed - Cr in normal range and within 180 days    Creatinine, Ser  Date Value Ref Range Status  02/14/2022 0.82 0.76 - 1.27 mg/dL Final         Passed - K in normal range and within 180 days    Potassium  Date Value Ref Range Status  02/14/2022 3.7 3.5 - 5.2 mmol/L Final         Passed - Na in normal range and within 180 days    Sodium  Date Value Ref Range Status  02/14/2022 144 134 - 144 mmol/L Final         Passed - Last BP in normal range    BP Readings from Last 1 Encounters:  02/14/22 131/81         Passed - Valid encounter within last 6 months    Recent Outpatient Visits           3 months ago Mixed hyperlipidemia   [place] [facility] [name], NP   9 months ago Medication refill   [place] [facility] [name], NP   1 year ago Blood pressure check   [place] [facility] [name], NP   1 year ago Essential hypertension   [place] [facility] [name], NP   1 year ago Essential hypertension   [place] [facility] [name], NP       Future Appointments             In 3 months Randa Evens, Kinnie Scales, NP [place] [facility]

## 2022-06-21 IMAGING — PT NM PET TUM IMG SKULL BASE T - THIGH
1 of 7 series · 1 of 25 positions shown · non-contrast
Comparison: Whole-body bone scan 01/27/2020

CLINICAL DATA: New diagnosis prostate carcinoma.  PSA equal 112.

EXAM:
NUCLEAR MEDICINE PET SKULL BASE TO THIGH
TECHNIQUE: 9.4 mCi F18 Piflufolastat (Pylarify) was injected intravenously.
Full-ring PET imaging was performed from the skull base to thigh
after the radiotracer. CT data was obtained and used for attenuation
correction and anatomic localization.

[Series 4: ct sk_thigh 5.0 bf37 · axial · 5.0mm · 0.98mm/px · 1 of 263 slices shown]
[im 263/263  brain]
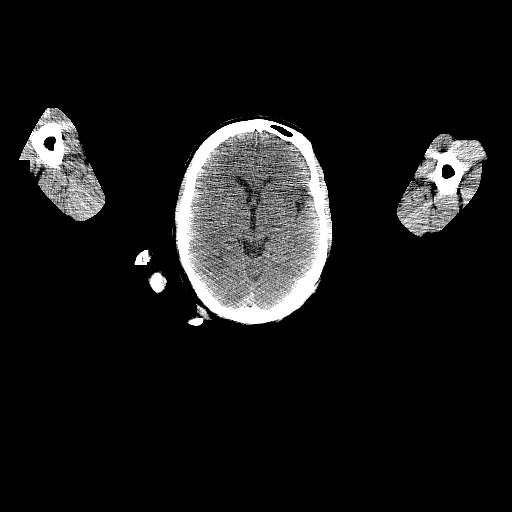

[1 of 25 positions shown; findings below may reference images not displayed]

FINDINGS: NECK

No radiotracer activity in neck lymph nodes.

Incidental CT finding: None

CHEST

No radiotracer accumulation within mediastinal or hilar lymph nodes.
No suspicious pulmonary nodules on the CT scan.

Incidental CT finding: None

ABDOMEN/PELVIS

Prostate: Intense focal uptake in the RIGHT prostate gland with SUV
max equal 21.6. (Image 206).

Lymph nodes: No abnormal radiotracer accumulation within pelvic or
abdominal nodes.

Liver: No evidence of liver metastasis

Incidental CT finding: None

SKELETON

No focal  activity to suggest skeletal metastasis.
IMPRESSION: 1. Focal activity in the RIGHT prostate gland consistent with
high-grade carcinoma.
2. No evidence of metastatic lymphadenopathy in the pelvis or
periaortic retroperitoneum.
3. No visceral metastasis or skeletal metastasis.

## 2022-07-11 ENCOUNTER — Encounter: Payer: Self-pay | Admitting: Obstetrics and Gynecology

## 2022-07-11 ENCOUNTER — Other Ambulatory Visit: Payer: Medicaid Other | Admitting: Obstetrics and Gynecology

## 2022-07-11 NOTE — Patient Instructions (Signed)
Hey Wayne [name], nice catching up with you today-have a great day!!  Wayne [name] was given information about Medicaid Managed Care team care coordination services as a part of their [facility] Community Plan Medicaid benefit. Wayne [name] verbally consented to engagement with the [facility] Managed Care team.   If you are experiencing a medical emergency, please call 911 or report to your local emergency department or urgent care.   If you have a non-emergency medical problem during routine business hours, please contact your provider's office and ask to speak with a nurse.   For questions related to your [facility], please call: [phone] or visit the homepage here: [email or web address]  If you would like to schedule transportation through your [facility], please call the following number at least 2 days in advance of your appointment: [phone]   Rides for urgent appointments can also be made after hours by calling Member Services.  Call the Behavioral Health Crisis Line at [phone], at any time, 24 hours a day, 7 days a week. If you are in danger or need immediate medical attention call 911.  If you would like help to quit smoking, call 1-800-QUIT-NOW (867-599-6194) OR Espaol: 1-855-Djelo-Ya (4-034-742-5956) o para ms informacin haga clic aqu or Text READY to 387-564 to register via text  Wayne [name] - following are the goals we discussed in your visit today:   Goals Addressed             This Visit's Progress    Disease Progression Prevented or Minimized       07/11/22:  Has upcoming appt with Dr. Laverle [name] and has PCP appt in July   Patient verbalizes understanding of instructions and care plan provided today and agrees to view in MyChart. Active MyChart status and patient understanding of how to access instructions and care plan via MyChart confirmed with  patient.     The Managed Medicaid care management team will reach out to the patient again over the next 90 business  days.  The  Patient  has been provided with contact information for the Managed Medicaid care management team and has been advised to call with any health related questions or concerns.   Wayne Der RN, BSN [place]  [facility] Care Management Coordinator - Managed Medicaid High Risk [phone]   Following is a copy of your plan of care:  Care Plan : General Plan of Care (Adult)  Updates made by [name], RN since 07/11/2022 12:00 AM     Problem: Health Promotion or Disease Self-Management (General Plan of Care)   Priority: Medium  Onset Date: 04/25/2020     Long-Range Goal: Self-Management Plan Developed   Start Date: 04/25/2020  Expected End Date: 10/10/2022  Recent Progress: On track  Priority: Medium  Note:    Current Barriers:   Knowledge Deficits related to short term plan for care coordination needs and long term plans for chronic disease management, HTN, gout, h/o prostate cancer, tobacco use 07/11/22:  No complaints today-BP stable-continues to smoke 8 cigarettes a day-has PCP appt in July.    Nurse Case Manager Clinical Goal(s):  patient will work with care management team to address care coordination and chronic disease management needs related to Disease Management Educational Needs Care Coordination Medication Management and Education Medication Reconciliation Medication Assistance  Psychosocial Support    Interventions:  Evaluation of current treatment plan and patient's adherence to plan as established by provider. Provided  education to patient re: post surgical pain. Reviewed medications with patient.   [name] with Pharmacy  regarding medications. Discussed plans with patient for ongoing care management follow up and provided patient with direct contact information for care management team Reviewed  scheduled/upcoming provider appointments.  Pharmacy referral for medication review-Pharmacist following Collaborated with PCP for nicotine patch to stop smoking-completed.  Hypertension Interventions:  (Status:  New goal.) Long Term Goal Last practice recorded BP readings:  BP Readings from Last 3 Encounters:  02/14/22 131/81  07/31/21 125/85  03/13/21 120/84  05/11/22         122/82 07/11/22         124/78  Most recent eGFR/CrCl:  Lab Results  Component Value Date   EGFR 100 02/14/2022    No components found for: "CRCL"  Evaluation of current treatment plan related to hypertension self management and patient's adherence to plan as established by provider Discussed plans with patient for ongoing care management follow up and provided patient with direct contact information for care management team Advised patient, providing education and rationale, to monitor blood pressure daily and record, calling PCP for findings outside established parameters Reviewed scheduled/upcoming provider appointments including:  Assessed social determinant of health barriers  Smoking Cessation Interventions:  (Status:  New goal.) Long Term Goal Reviewed smoking history:  currently smoking 8 cigarettes a day Evaluation of current treatment plan reviewed Advised patient to discuss smoking cessation options with provider Reviewed scheduled/upcoming provider appointments  Provided contact information for [place] Quit Line (1-800-QUIT-NOW) Discussed plans with patient for ongoing care management follow up and provided patient with direct contact information for care management team Assessed social determinant of health barriers   Self Care Activities:  Patient will self administer medications as prescribed Patient will attend all scheduled provider appointments Patient will call pharmacy for medication refills Patient will call provider office for new concerns or questions Patient will contact insurance  provider for coverage of incontinence pads.  Patient Goals: Over the next 30 days, patient will attend all provider appointments. Over the next 30 days, patient will start nicotine patch.-completed Over the next 30 days, patient will speak with Pharmacist regarding medications. Over the next 30 days, patient will check blood pressure on a regular basis. . Follow Up Plan: The care management team will reach out to the patient again over the next 90 business  days.

## 2022-07-11 NOTE — Patient Outreach (Signed)
Medicaid Managed Care   Nurse Care Manager Note  07/11/2022 Name:  Wayne [name] MRN:  [id number] DOB:  July 22, 1960  Wayne [name] is an 62 y.o. year old male who is a primary patient of Wayne Sessions, NP.  The [facility] Managed Care Coordination team was consulted for assistance with:    Chronic healthcare management needs, gout, HTN, tobacco use, h/o prostate cancer, osteoarthritis  Mr. Afflerbach was given information about Medicaid Managed Care Coordination team services today. Wayne [name] Patient agreed to services and verbal consent obtained.  Engaged with patient by telephone for follow up visit in response to provider referral for case management and/or care coordination services.   Assessments/Interventions:  Review of past medical history, allergies, medications, health status, including review of consultants reports, laboratory and other test data, was performed as part of comprehensive evaluation and provision of chronic care management services.  SDOH (Social Determinants of Health) assessments and interventions performed: SDOH Interventions    Flowsheet Row Patient Outreach Telephone from 07/11/2022 in [place] POPULATION HEALTH DEPARTMENT Patient Outreach Telephone from 05/11/2022 in [place] POPULATION HEALTH DEPARTMENT Patient Outreach Telephone from 03/14/2022 in [place] POPULATION HEALTH DEPARTMENT Patient Outreach Telephone from 01/11/2022 in [place] POPULATION HEALTH DEPARTMENT Patient Outreach Telephone from 12/11/2021 in [place] POPULATION HEALTH DEPARTMENT Patient Outreach Telephone from 11/10/2021 in [facility] Community Care Coordination  SDOH Interventions        Food Insecurity Interventions -- -- Intervention Not Indicated -- -- --  Housing Interventions -- -- Intervention Not Indicated -- -- --  Transportation Interventions -- -- -- Intervention Not Indicated -- --  Utilities Interventions -- Intervention Not Indicated -- -- --  --  Alcohol Usage Interventions -- -- -- -- Intervention Not Indicated (Score <7) --  Financial Strain Interventions -- Intervention Not Indicated -- -- -- Intervention Not Indicated  Physical Activity Interventions Intervention Not Indicated  [not able to engage in strenuous exercise due to knees] -- -- -- -- Intervention Not Indicated  [knee pain, gout, osteoarthritis limits his activity]  Stress Interventions Intervention Not Indicated -- -- -- Intervention Not Indicated --  Social Connections Interventions -- -- -- Intervention Not Indicated -- --     Care Plan  Allergies  Allergen Reactions   Hydralazine Other (See Comments)    Headache   Medications Reviewed Today     Reviewed by [name], RN (Registered Nurse) on 07/11/22 at 1107  Med List Status: <None>   Medication Order Taking? Sig Documenting Provider Last Dose Status Informant  allopurinol (ZYLOPRIM) 300 MG tablet [id number]  Take 1 tablet (300 mg total) by mouth every morning. Wayne Sessions, NP  Active   chlorthalidone (HYGROTON) 25 MG tablet [id number]  Take 1 tablet (25 mg total) by mouth daily. Wayne Sessions, NP  Active   gemfibrozil (LOPID) 600 MG tablet [id number]  Take 1 tablet (600 mg total) by mouth 2 (two) times daily before a meal. Wayne Sessions, NP  Active   ibuprofen (ADVIL) 800 MG tablet [id number]  Take 1 tablet (800 mg total) by mouth every 8 (eight) hours as needed for moderate pain. Wayne Sessions, NP  Active   lidocaine (LIDODERM) 5 % [id number]  Place 1 patch onto the skin daily. Remove & Discard patch within 12 hours or as directed by MD Wayne Sessions, NP  Active   losartan (COZAAR) 100 MG tablet [id number]  TAKE ONE TABLET BY MOUTH EVERY EVENING Edwards, Kinnie Scales,  NP  Active   tadalafil (CIALIS) 10 MG tablet [id number] No Take 1 tablet (10 mg total) by mouth every other day as needed for erectile dysfunction. Wayne Sessions, NP Taking Active   Med List Note Wayne Sessions, NP 06/27/20 1021):               Patient Active Problem List   Diagnosis Date Noted   Primary osteoarthritis of left knee 08/22/2021   Primary osteoarthritis of right knee 08/22/2021   Prostate cancer (HCC) 04/04/2020   Malignant neoplasm of prostate (HCC) 01/17/2020   Acute gout due to renal impairment involving hand 08/12/2017   Essential hypertension 08/12/2017   Unilateral primary osteoarthritis, left knee 05/25/2016   Unilateral primary osteoarthritis, right knee 05/25/2016   Conditions to be addressed/monitored per PCP order:  Chronic healthcare management needs, gout, HTN, tobacco use, h/o prostate cancer, osteoarthritis  Care Plan : General Plan of Care (Adult)  Updates made by [name], RN since 07/11/2022 12:00 AM     Problem: Health Promotion or Disease Self-Management (General Plan of Care)   Priority: Medium  Onset Date: 04/25/2020     Long-Range Goal: Self-Management Plan Developed   Start Date: 04/25/2020  Expected End Date: 10/10/2022  Recent Progress: On track  Priority: Medium  Note:    Current Barriers:   Knowledge Deficits related to short term plan for care coordination needs and long term plans for chronic disease management, HTN, gout, h/o prostate cancer, tobacco use 07/11/22:  No complaints today-BP stable-continues to smoke 8 cigarettes a day-has PCP appt in July.    Nurse Case Manager Clinical Goal(s):  patient will work with care management team to address care coordination and chronic disease management needs related to Disease Management Educational Needs Care Coordination Medication Management and Education Medication Reconciliation Medication Assistance  Psychosocial Support    Interventions:  Evaluation of current treatment plan and patient's adherence to plan as established by provider. Provided education to patient re: post surgical pain. Reviewed medications with patient.   [name] with Pharmacy  regarding  medications. Discussed plans with patient for ongoing care management follow up and provided patient with direct contact information for care management team Reviewed scheduled/upcoming provider appointments.  Pharmacy referral for medication review-Pharmacist following Collaborated with PCP for nicotine patch to stop smoking-completed.  Hypertension Interventions:  (Status:  New goal.) Long Term Goal Last practice recorded BP readings:  BP Readings from Last 3 Encounters:  02/14/22 131/81  07/31/21 125/85  03/13/21 120/84  05/11/22         122/82 07/11/22         124/78  Most recent eGFR/CrCl:  Lab Results  Component Value Date   EGFR 100 02/14/2022    No components found for: "CRCL"  Evaluation of current treatment plan related to hypertension self management and patient's adherence to plan as established by provider Discussed plans with patient for ongoing care management follow up and provided patient with direct contact information for care management team Advised patient, providing education and rationale, to monitor blood pressure daily and record, calling PCP for findings outside established parameters Reviewed scheduled/upcoming provider appointments including:  Assessed social determinant of health barriers  Smoking Cessation Interventions:  (Status:  New goal.) Long Term Goal Reviewed smoking history:  currently smoking 8 cigarettes a day Evaluation of current treatment plan reviewed Advised patient to discuss smoking cessation options with provider Reviewed scheduled/upcoming provider appointments  Provided contact information for [place] [facility]uit Line (1-800-QUIT-NOW) Discussed  plans with patient for ongoing care management follow up and provided patient with direct contact information for care management team Assessed social determinant of health barriers   Self Care Activities:  Patient will self administer medications as prescribed Patient will attend all scheduled  provider appointments Patient will call pharmacy for medication refills Patient will call provider office for new concerns or questions Patient will contact insurance provider for coverage of incontinence pads.  Patient Goals: Over the next 30 days, patient will attend all provider appointments. Over the next 30 days, patient will start nicotine patch.-completed Over the next 30 days, patient will speak with Pharmacist regarding medications. Over the next 30 days, patient will check blood pressure on a regular basis. . Follow Up Plan: The care management team will reach out to the patient again over the next 90 business  days.    Follow Up:  Patient agrees to Care Plan and Follow-up.  Plan: The Managed Medicaid care management team will reach out to the patient again over the next 90 business  days. and The  Patient has been provided with contact information for the Managed Medicaid care management team and has been advised to call with any health related questions or concerns.  Date/time of next scheduled RN care management/care coordination outreach:  10/10/22 at 1030

## 2022-08-01 DIAGNOSIS — C61 Malignant neoplasm of prostate: Secondary | ICD-10-CM | POA: Diagnosis not present

## 2022-08-08 DIAGNOSIS — C61 Malignant neoplasm of prostate: Secondary | ICD-10-CM | POA: Diagnosis not present

## 2022-08-08 DIAGNOSIS — N5201 Erectile dysfunction due to arterial insufficiency: Secondary | ICD-10-CM | POA: Diagnosis not present

## 2022-08-08 DIAGNOSIS — N393 Stress incontinence (female) (male): Secondary | ICD-10-CM | POA: Diagnosis not present

## 2022-08-13 ENCOUNTER — Other Ambulatory Visit (INDEPENDENT_AMBULATORY_CARE_PROVIDER_SITE_OTHER): Payer: Self-pay | Admitting: Primary Care

## 2022-08-13 DIAGNOSIS — Z760 Encounter for issue of repeat prescription: Secondary | ICD-10-CM

## 2022-08-13 DIAGNOSIS — I10 Essential (primary) hypertension: Secondary | ICD-10-CM

## 2022-08-13 DIAGNOSIS — Z8739 Personal history of other diseases of the musculoskeletal system and connective tissue: Secondary | ICD-10-CM

## 2022-08-14 ENCOUNTER — Other Ambulatory Visit (INDEPENDENT_AMBULATORY_CARE_PROVIDER_SITE_OTHER): Payer: Self-pay

## 2022-08-14 DIAGNOSIS — I10 Essential (primary) hypertension: Secondary | ICD-10-CM

## 2022-08-14 DIAGNOSIS — Z760 Encounter for issue of repeat prescription: Secondary | ICD-10-CM

## 2022-08-14 MED ORDER — CHLORTHALIDONE 25 MG PO TABS
25.0000 mg | ORAL_TABLET | Freq: Every day | ORAL | 1 refills | Status: DC
Start: 2022-08-14 — End: 2022-10-11

## 2022-08-14 MED ORDER — LOSARTAN POTASSIUM 100 MG PO TABS
ORAL_TABLET | ORAL | 1 refills | Status: DC
Start: 2022-08-14 — End: 2022-10-11

## 2022-08-15 ENCOUNTER — Other Ambulatory Visit (INDEPENDENT_AMBULATORY_CARE_PROVIDER_SITE_OTHER): Payer: Self-pay | Admitting: Primary Care

## 2022-08-15 DIAGNOSIS — Z760 Encounter for issue of repeat prescription: Secondary | ICD-10-CM

## 2022-08-15 DIAGNOSIS — Z8739 Personal history of other diseases of the musculoskeletal system and connective tissue: Secondary | ICD-10-CM

## 2022-08-21 ENCOUNTER — Ambulatory Visit (INDEPENDENT_AMBULATORY_CARE_PROVIDER_SITE_OTHER): Payer: Medicaid Other | Admitting: Primary Care

## 2022-08-27 ENCOUNTER — Encounter (INDEPENDENT_AMBULATORY_CARE_PROVIDER_SITE_OTHER): Payer: Self-pay | Admitting: Primary Care

## 2022-08-27 ENCOUNTER — Ambulatory Visit (INDEPENDENT_AMBULATORY_CARE_PROVIDER_SITE_OTHER): Payer: Medicaid Other | Admitting: Primary Care

## 2022-08-27 VITALS — BP 115/82 | HR 93 | Resp 16 | Ht 75.0 in | Wt 228.2 lb

## 2022-08-27 DIAGNOSIS — Z2821 Immunization not carried out because of patient refusal: Secondary | ICD-10-CM | POA: Diagnosis not present

## 2022-08-27 DIAGNOSIS — Z28311 Partially vaccinated for covid-19: Secondary | ICD-10-CM | POA: Diagnosis not present

## 2022-08-27 DIAGNOSIS — C61 Malignant neoplasm of prostate: Secondary | ICD-10-CM

## 2022-08-27 DIAGNOSIS — I10 Essential (primary) hypertension: Secondary | ICD-10-CM

## 2022-08-27 DIAGNOSIS — Z760 Encounter for issue of repeat prescription: Secondary | ICD-10-CM

## 2022-08-27 DIAGNOSIS — M1711 Unilateral primary osteoarthritis, right knee: Secondary | ICD-10-CM

## 2022-08-27 DIAGNOSIS — E782 Mixed hyperlipidemia: Secondary | ICD-10-CM | POA: Diagnosis not present

## 2022-08-27 DIAGNOSIS — Z23 Encounter for immunization: Secondary | ICD-10-CM | POA: Diagnosis not present

## 2022-08-27 DIAGNOSIS — E663 Overweight: Secondary | ICD-10-CM

## 2022-08-27 DIAGNOSIS — M1A9XX1 Chronic gout, unspecified, with tophus (tophi): Secondary | ICD-10-CM

## 2022-08-27 MED ORDER — CELECOXIB 200 MG PO CAPS
200.0000 mg | ORAL_CAPSULE | Freq: Two times a day (BID) | ORAL | 1 refills | Status: DC
Start: 2022-08-27 — End: 2023-10-13

## 2022-08-27 MED ORDER — AMLODIPINE BESYLATE 10 MG PO TABS
10.0000 mg | ORAL_TABLET | Freq: Every day | ORAL | 0 refills | Status: DC
Start: 2022-08-27 — End: 2022-10-11

## 2022-08-27 NOTE — Progress Notes (Signed)
[facility]  Rhylee Worthing, is a 62 y.o. male  ZOX:[id number]  WJX:[id number]  DOB - 08-26-1960  Chief Complaint  Patient presents with   Hypertension       Subjective:   Mr. Forrest Millican is a 62 y.o. male here today for a follow up visit for HTN whish is well controlled . Patient has No headache, No chest pain, No abdominal pain - No Nausea, No new weakness tingling or numbness, No Cough - shortness of breath. He c/o bilateral knee pain and last week had a gout attack on the right knee. Patient is aware he needs a knee replacement bilateral but he is trying to wait out and pray the Shaune Pollack takes the pain away.   No problems updated.  Allergies  Allergen Reactions   Hydralazine Other (See Comments)    Headache    Past Medical History:  Diagnosis Date   Arthritis    shoulders    Gout    High cholesterol    Hypertension    Prostate cancer (HCC)     Current Outpatient Medications on File Prior to Visit  Medication Sig Dispense Refill   allopurinol (ZYLOPRIM) 300 MG tablet TAKE ONE TABLET BY MOUTH EVERY MORNING 90 tablet 0   amLODipine (NORVASC) 10 MG tablet TAKE ONE TABLET BY MOUTH ONCE DAILY 90 tablet 0   atorvastatin (LIPITOR) 40 MG tablet TAKE ONE TABLET BY MOUTH EVERY EVENING 90 tablet 0   chlorthalidone (HYGROTON) 25 MG tablet Take 1 tablet (25 mg total) by mouth daily. 90 tablet 1   gemfibrozil (LOPID) 600 MG tablet Take 1 tablet (600 mg total) by mouth 2 (two) times daily before a meal. 180 tablet 1   ibuprofen (ADVIL) 800 MG tablet Take 1 tablet (800 mg total) by mouth every 8 (eight) hours as needed for moderate pain. 90 tablet O   lidocaine (LIDODERM) 5 % Place 1 patch onto the skin daily. Remove & Discard patch within 12 hours or as directed by MD 30 patch 1   losartan (COZAAR) 100 MG tablet TAKE ONE TABLET BY MOUTH EVERY EVENING 90 tablet 1   tadalafil (CIALIS) 10 MG tablet Take 1 tablet (10 mg total) by mouth every other day as needed for erectile  dysfunction. 10 tablet 1   No current facility-administered medications on file prior to visit.    Objective:   Vitals:   08/27/22 0918  BP: 115/82  Pulse: 93  Resp: 16  SpO2: 96%  Weight: 228 lb 3.2 oz (103.5 kg)  Height: 6\' 3"  (1.905 m)    Comprehensive ROS Pertinent positive and negative noted in HPI   Exam General appearance : Awake, alert, not in any distress. Speech Clear. Not toxic looking HEENT: Atraumatic and Normocephalic, pupils equally reactive to light and accomodation Neck: Supple, no JVD. No cervical lymphadenopathy.  Chest: Good air entry bilaterally, no added sounds  CVS: S1 S2 regular, no murmurs.  Abdomen: Bowel sounds present, Non tender and not distended with no gaurding, rigidity or rebound. Extremities: B/L Lower Ext shows no edema, both legs are warm to touch Neurology: Awake alert, and oriented X 3, CN II-XII intact, Non focal Skin: No Rash  Data Review Lab Results  Component Value Date   HGBA1C 6.5 (H) 02/14/2022    Assessment & Plan  Alif was seen today for hypertension.  Diagnoses and all orders for this visit:  Essential hypertension BP goal - < 130/80 well control  Explained that having normal blood pressure is  the goal and medications are helping to get to goal and maintain normal blood pressure. DIET: Limit salt intake, read nutrition labels to check salt content, limit fried and high fatty foods  Avoid using multisymptom OTC cold preparations that generally contain sudafed which can rise BP. Consult with pharmacist on best cold relief products to use for persons with HTN EXERCISE Discussed incorporating exercise such as walking - 30 minutes most days of the week and can do in 10 minute intervals    -     CMP14+EGFR  Mixed hyperlipidemia  Healthy lifestyle diet of fruits vegetables fish nuts whole grains and low saturated fat . Foods high in cholesterol or liver, fatty meats,cheese, butter avocados, nuts and seeds, chocolate and  fried foods. -     Lipid Panel  Primary osteoarthritis of right knee -     celecoxib (CELEBREX) 200 MG capsule; Take 1 capsule (200 mg total) by mouth 2 (two) times daily.  Chronic gout with tophus, unspecified cause, unspecified site -     celecoxib (CELEBREX) 200 MG capsule; Take 1 capsule (200 mg total) by mouth 2 (two) times daily. -     Uric Acid  Prostate cancer (HCC) Followed by urology   Need for shingles vaccine Declined   COVID-19 vaccine second dose declined  Overweight (BMI 25.0-29.9) Discussed diet and exercise for person with BMI >25. Instructed: You must burn more calories than you eat. Losing 5 percent of your body weight should be considered a success. In the longer term, losing more than 15 percent of your body weight and staying at this weight is an extremely good result. However, keep in mind that even losing 5 percent of your body weight leads to important health benefits, so try not to get discouraged if you're not able to lose more than this. Will recheck weight in 3-6 months.      Patient have been counseled extensively about nutrition and exercise. Other issues discussed during this visit include: low cholesterol diet, weight control and daily exercise, foot care, annual eye examinations at Ophthalmology, importance of adherence with medications and regular follow-up. We also discussed long term complications of uncontrolled diabetes and hypertension.   No follow-ups on file.  The patient was given clear instructions to go to ER or return to medical center if symptoms don't improve, worsen or new problems develop. The patient verbalized understanding. The patient was told to call to get lab results if they haven't heard anything in the next week.   This note has been created with Education officer, environmental. Any transcriptional errors are unintentional.   [name], NP 08/27/2022, 10:11 AM

## 2022-08-27 NOTE — Patient Instructions (Signed)
Calorie Counting for Weight Loss Calories are units of energy. Your body needs a certain number of calories from food to keep going throughout the day. When you eat or drink more calories than your body needs, your body stores the extra calories mostly as fat. When you eat or drink fewer calories than your body needs, your body burns fat to get the energy it needs. Calorie counting means keeping track of how many calories you eat and drink each day. Calorie counting can be helpful if you need to lose weight. If you eat fewer calories than your body needs, you should lose weight. Ask your health care provider what a healthy weight is for you. For calorie counting to work, you will need to eat the right number of calories each day to lose a healthy amount of weight per week. A dietitian can help you figure out how many calories you need in a day and will suggest ways to reach your calorie goal. A healthy amount of weight to lose each week is usually 1-2 lb (0.5-0.9 kg). This usually means that your daily calorie intake should be reduced by 500-750 calories. Eating 1,200-1,500 calories a day can help most women lose weight. Eating 1,500-1,800 calories a day can help most men lose weight. What do I need to know about calorie counting? Work with your health care provider or dietitian to determine how many calories you should get each day. To meet your daily calorie goal, you will need to: Find out how many calories are in each food that you would like to eat. Try to do this before you eat. Decide how much of the food you plan to eat. Keep a food log. Do this by writing down what you ate and how many calories it had. To successfully lose weight, it is important to balance calorie counting with a healthy lifestyle that includes regular activity. Where do I find calorie information?  The number of calories in a food can be found on a Nutrition Facts label. If a food does not have a Nutrition Facts label, try  to look up the calories online or ask your dietitian for help. Remember that calories are listed per serving. If you choose to have more than one serving of a food, you will have to multiply the calories per serving by the number of servings you plan to eat. For example, the label on a package of bread might say that a serving size is 1 slice and that there are 90 calories in a serving. If you eat 1 slice, you will have eaten 90 calories. If you eat 2 slices, you will have eaten 180 calories. How do I keep a food log? After each time that you eat, record the following in your food log as soon as possible: What you ate. Be sure to include toppings, sauces, and other extras on the food. How much you ate. This can be measured in cups, ounces, or number of items. How many calories were in each food and drink. The total number of calories in the food you ate. Keep your food log near you, such as in a pocket-sized notebook or on an app or website on your mobile phone. Some programs will calculate calories for you and show you how many calories you have left to meet your daily goal. What are some portion-control tips? Know how many calories are in a serving. This will help you know how many servings you can have of a certain   food. Use a measuring cup to measure serving sizes. You could also try weighing out portions on a kitchen scale. With time, you will be able to estimate serving sizes for some foods. Take time to put servings of different foods on your favorite plates or in your favorite bowls and cups so you know what a serving looks like. Try not to eat straight from a food's packaging, such as from a bag or box. Eating straight from the package makes it hard to see how much you are eating and can lead to overeating. Put the amount you would like to eat in a cup or on a plate to make sure you are eating the right portion. Use smaller plates, glasses, and bowls for smaller portions and to prevent  overeating. Try not to multitask. For example, avoid watching TV or using your computer while eating. If it is time to eat, sit down at a table and enjoy your food. This will help you recognize when you are full. It will also help you be more mindful of what and how much you are eating. What are tips for following this plan? Reading food labels Check the calorie count compared with the serving size. The serving size may be smaller than what you are used to eating. Check the source of the calories. Try to choose foods that are high in protein, fiber, and vitamins, and low in saturated fat, trans fat, and sodium. Shopping Read nutrition labels while you shop. This will help you make healthy decisions about which foods to buy. Pay attention to nutrition labels for low-fat or fat-free foods. These foods sometimes have the same number of calories or more calories than the full-fat versions. They also often have added sugar, starch, or salt to make up for flavor that was removed with the fat. Make a grocery list of lower-calorie foods and stick to it. Cooking Try to cook your favorite foods in a healthier way. For example, try baking instead of frying. Use low-fat dairy products. Meal planning Use more fruits and vegetables. One-half of your plate should be fruits and vegetables. Include lean proteins, such as chicken, turkey, and fish. Lifestyle Each week, aim to do one of the following: 150 minutes of moderate exercise, such as walking. 75 minutes of vigorous exercise, such as running. General information Know how many calories are in the foods you eat most often. This will help you calculate calorie counts faster. Find a way of tracking calories that works for you. Get creative. Try different apps or programs if writing down calories does not work for you. What foods should I eat?  Eat nutritious foods. It is better to have a nutritious, high-calorie food, such as an avocado, than a food with  few nutrients, such as a bag of potato chips. Use your calories on foods and drinks that will fill you up and will not leave you hungry soon after eating. Examples of foods that fill you up are nuts and nut butters, vegetables, lean proteins, and high-fiber foods such as whole grains. High-fiber foods are foods with more than 5 g of fiber per serving. Pay attention to calories in drinks. Low-calorie drinks include water and unsweetened drinks. The items listed above may not be a complete list of foods and beverages you can eat. Contact a dietitian for more information. What foods should I limit? Limit foods or drinks that are not good sources of vitamins, minerals, or protein or that are high in unhealthy fats. These   include: Candy. Other sweets. Sodas, specialty coffee drinks, alcohol, and juice. The items listed above may not be a complete list of foods and beverages you should avoid. Contact a dietitian for more information. How do I count calories when eating out? Pay attention to portions. Often, portions are much larger when eating out. Try these tips to keep portions smaller: Consider sharing a meal instead of getting your own. If you get your own meal, eat only half of it. Before you start eating, ask for a container and put half of your meal into it. When available, consider ordering smaller portions from the menu instead of full portions. Pay attention to your food and drink choices. Knowing the way food is cooked and what is included with the meal can help you eat fewer calories. If calories are listed on the menu, choose the lower-calorie options. Choose dishes that include vegetables, fruits, whole grains, low-fat dairy products, and lean proteins. Choose items that are boiled, broiled, grilled, or steamed. Avoid items that are buttered, battered, fried, or served with cream sauce. Items labeled as crispy are usually fried, unless stated otherwise. Choose water, low-fat milk,  unsweetened iced tea, or other drinks without added sugar. If you want an alcoholic beverage, choose a lower-calorie option, such as a glass of wine or light beer. Ask for dressings, sauces, and syrups on the side. These are usually high in calories, so you should limit the amount you eat. If you want a salad, choose a garden salad and ask for grilled meats. Avoid extra toppings such as bacon, cheese, or fried items. Ask for the dressing on the side, or ask for olive oil and vinegar or lemon to use as dressing. Estimate how many servings of a food you are given. Knowing serving sizes will help you be aware of how much food you are eating at restaurants. Where to find more information Centers for Disease Control and Prevention: www.cdc.gov U.[place]. Department of Agriculture: myplate.gov Summary Calorie counting means keeping track of how many calories you eat and drink each day. If you eat fewer calories than your body needs, you should lose weight. A healthy amount of weight to lose per week is usually 1-2 lb (0.5-0.9 kg). This usually means reducing your daily calorie intake by 500-750 calories. The number of calories in a food can be found on a Nutrition Facts label. If a food does not have a Nutrition Facts label, try to look up the calories online or ask your dietitian for help. Use smaller plates, glasses, and bowls for smaller portions and to prevent overeating. Use your calories on foods and drinks that will fill you up and not leave you hungry shortly after a meal. This information is not intended to replace advice given to you by your health care provider. Make sure you discuss any questions you have with your health care provider. Document Revised: 02/19/2019 Document Reviewed: 02/19/2019 Elsevier Patient Education  2023 Elsevier Inc.  

## 2022-08-31 ENCOUNTER — Encounter (INDEPENDENT_AMBULATORY_CARE_PROVIDER_SITE_OTHER): Payer: Self-pay | Admitting: Primary Care

## 2022-09-11 DIAGNOSIS — N5201 Erectile dysfunction due to arterial insufficiency: Secondary | ICD-10-CM | POA: Diagnosis not present

## 2022-10-09 ENCOUNTER — Ambulatory Visit (INDEPENDENT_AMBULATORY_CARE_PROVIDER_SITE_OTHER): Payer: Medicaid Other | Admitting: Orthopaedic Surgery

## 2022-10-09 ENCOUNTER — Other Ambulatory Visit (INDEPENDENT_AMBULATORY_CARE_PROVIDER_SITE_OTHER): Payer: Medicaid Other

## 2022-10-09 ENCOUNTER — Other Ambulatory Visit: Payer: Medicaid Other | Admitting: Obstetrics and Gynecology

## 2022-10-09 DIAGNOSIS — M1711 Unilateral primary osteoarthritis, right knee: Secondary | ICD-10-CM | POA: Diagnosis not present

## 2022-10-09 MED ORDER — BUPIVACAINE HCL 0.5 % IJ SOLN
2.0000 mL | INTRAMUSCULAR | Status: AC | PRN
Start: 2022-10-09 — End: 2022-10-09
  Administered 2022-10-09: 2 mL via INTRA_ARTICULAR

## 2022-10-09 MED ORDER — METHYLPREDNISOLONE ACETATE 40 MG/ML IJ SUSP
40.0000 mg | INTRAMUSCULAR | Status: AC | PRN
Start: 2022-10-09 — End: 2022-10-09
  Administered 2022-10-09: 40 mg via INTRA_ARTICULAR

## 2022-10-09 MED ORDER — LIDOCAINE HCL 1 % IJ SOLN
2.0000 mL | INTRAMUSCULAR | Status: AC | PRN
Start: 2022-10-09 — End: 2022-10-09
  Administered 2022-10-09: 2 mL

## 2022-10-09 NOTE — Progress Notes (Signed)
Office Visit Note   Patient: Wayne [name]           Date of Birth: 1960-02-01           MRN: [id number] Visit Date: 10/09/2022              Requested by: [name], NP [address] [place],  [place] [address] PCP: [name], NP   Assessment & Plan: Visit Diagnoses:  1. Primary osteoarthritis of right knee     Plan: Adrik is a 62 year old gentleman with recurrent right knee pain and effusion and advanced DJD.  35 cc of fluid aspirated from the knee which will be sent to the lab.  Cortisone administered.  He tolerated this well.  Follow-up as needed.  Follow-Up Instructions: No follow-ups on file.   Orders:  Orders Placed This Encounter  Procedures   XR KNEE 3 VIEW RIGHT   No orders of the defined types were placed in this encounter.     Procedures: Large Joint Inj: R knee on 10/09/2022 1:08 PM Indications: pain Details: 22 G needle  Arthrogram: No  Medications: 40 mg methylPREDNISolone acetate 40 MG/ML; 2 mL lidocaine 1 %; 2 mL bupivacaine 0.5 % Consent was given by the patient. Patient was prepped and draped in the usual sterile fashion.       Clinical Data: No additional findings.   Subjective: Chief Complaint  Patient presents with   Right Knee - Pain    HPI Wayne [name] is a 62 year old gentleman with 2 weeks of acute onset of right knee pain.  Has a history of gout.  He is limping due to the pain.  Denies any injuries. Review of Systems  Constitutional: Negative.   HENT: Negative.    Eyes: Negative.   Respiratory: Negative.    Cardiovascular: Negative.   Gastrointestinal: Negative.   Endocrine: Negative.   Genitourinary: Negative.   Skin: Negative.   Allergic/Immunologic: Negative.   Neurological: Negative.   Hematological: Negative.   Psychiatric/Behavioral: Negative.    All other systems reviewed and are negative.    Objective: Vital Signs: There were no vitals taken for this visit.  Physical Exam Vitals and  nursing note reviewed.  Constitutional:      Appearance: He is well-developed.  HENT:     Head: Normocephalic and atraumatic.  Eyes:     Pupils: Pupils are equal, round, and reactive to light.  Pulmonary:     Effort: Pulmonary effort is normal.  Abdominal:     Palpations: Abdomen is soft.  Musculoskeletal:        General: Normal range of motion.     Cervical back: Neck supple.  Skin:    General: Skin is warm.  Neurological:     Mental Status: He is alert and oriented to person, place, and time.  Psychiatric:        Behavior: Behavior normal.        Thought Content: Thought content normal.        Judgment: Judgment normal.     Ortho Exam Exam of the right knee shows large joint effusion.  No signs of infection. Specialty Comments:  No specialty comments available.  Imaging: XR KNEE 3 VIEW RIGHT  Result Date: 10/09/2022 X-rays demonstrate severe osteoarthritis.  Bone-on-bone joint space narrowing.    PMFS History: Patient Active Problem List   Diagnosis Date Noted   Primary osteoarthritis of left knee 08/22/2021   Primary osteoarthritis of right knee 08/22/2021   Prostate cancer (HCC)  04/04/2020   Malignant neoplasm of prostate (HCC) 01/17/2020   Acute gout due to renal impairment involving hand 08/12/2017   Essential hypertension 08/12/2017   Unilateral primary osteoarthritis, left knee 05/25/2016   Unilateral primary osteoarthritis, right knee 05/25/2016   Past Medical History:  Diagnosis Date   Arthritis    shoulders    Gout    High cholesterol    Hypertension    Prostate cancer (HCC)     Family History  Problem Relation Age of Onset   Colon cancer Mother    Pancreatic cancer Maternal Uncle    Colon polyps Neg Hx    Esophageal cancer Neg Hx    Rectal cancer Neg Hx     Past Surgical History:  Procedure Laterality Date   KNEE ARTHROSCOPY     LUMBAR DISC SURGERY     LYMPHADENECTOMY Bilateral 04/04/2020   Procedure: LYMPHADENECTOMY, PELVIC;  Surgeon:  Heloise Purpura, MD;  Location: [facility];  Service: Urology;  Laterality: Bilateral;   ROBOT ASSISTED LAPAROSCOPIC RADICAL PROSTATECTOMY N/A 04/04/2020   Procedure: XI ROBOTIC ASSISTED LAPAROSCOPIC RADICAL PROSTATECTOMY LEVEL 2;  Surgeon: Heloise Purpura, MD;  Location: [facility];  Service: Urology;  Laterality: N/A;   Social History   Occupational History    Comment: patient does not work full time  Tobacco Use   Smoking status: Every Day    Current packs/day: 0.50    Types: Cigarettes   Smokeless tobacco: Never   Tobacco comments:    Started smoking at the age of 34  Vaping Use   Vaping status: Never Used  Substance and Sexual Activity   Alcohol use: Yes    Alcohol/week: 14.0 standard drinks of alcohol    Types: 14 Cans of beer per week   Drug use: No   Sexual activity: Yes

## 2022-10-09 NOTE — Patient Outreach (Signed)
Medicaid Managed Care   Nurse Care Manager Note  10/09/2022 Name:  Wayne [name] MRN:  [id number] DOB:  August 20, 1960  Wayne [name] is an 62 y.o. year old male who is a primary patient of [name], NP.  The [facility] Managed Care Coordination team was consulted for assistance with:    Chronic healthcare management needs, gout, HTN, tobacco use, osteoarthritis, h/o prostate cancer  [name] was given information about Medicaid Managed Care Coordination team services today. Wayne [name] Patient agreed to services and verbal consent obtained.  Engaged with patient by telephone for follow up visit in response to provider referral for case management and/or care coordination services.   Assessments/Interventions:  Review of past medical history, allergies, medications, health status, including review of consultants reports, laboratory and other test data, was performed as part of comprehensive evaluation and provision of chronic care management services.  SDOH (Social Determinants of Health) assessments and interventions performed: SDOH Interventions    Flowsheet Row Patient Outreach Telephone from 10/09/2022 in [place] POPULATION HEALTH DEPARTMENT Patient Outreach Telephone from 07/11/2022 in [place] POPULATION HEALTH DEPARTMENT Patient Outreach Telephone from 05/11/2022 in [place] POPULATION HEALTH DEPARTMENT Patient Outreach Telephone from 03/14/2022 in [place] POPULATION HEALTH DEPARTMENT Patient Outreach Telephone from 01/11/2022 in [place] POPULATION HEALTH DEPARTMENT Patient Outreach Telephone from 12/11/2021 in [place] POPULATION HEALTH DEPARTMENT  SDOH Interventions        Food Insecurity Interventions -- -- -- Intervention Not Indicated -- --  Housing Interventions -- -- -- Intervention Not Indicated -- --  Transportation Interventions -- -- -- -- Intervention Not Indicated --  Utilities Interventions -- -- Intervention Not Indicated -- -- --  Alcohol  Usage Interventions Intervention Not Indicated (Score <7) -- -- -- -- Intervention Not Indicated (Score <7)  Financial Strain Interventions -- -- Intervention Not Indicated -- -- --  Physical Activity Interventions -- Intervention Not Indicated  [not able to engage in strenuous exercise due to knees] -- -- -- --  Stress Interventions -- Intervention Not Indicated -- -- -- Intervention Not Indicated  Social Connections Interventions -- -- -- -- Intervention Not Indicated --  Health Literacy Interventions Intervention Not Indicated -- -- -- -- --     Care Plan Allergies  Allergen Reactions   Hydralazine Other (See Comments)    Headache    Medications Reviewed Today     Reviewed by [name], RN (Registered Nurse) on 10/09/22 at 1157  Med List Status: <None>   Medication Order Taking? Sig Documenting Provider Last Dose Status Informant  allopurinol (ZYLOPRIM) 300 MG tablet [id number]  TAKE ONE TABLET BY MOUTH EVERY MORNING Newlin, Enobong, MD  Active   amLODipine (NORVASC) 10 MG tablet [id number]  Take 1 tablet (10 mg total) by mouth daily. [name], NP  Active   atorvastatin (LIPITOR) 40 MG tablet [id number]  TAKE ONE TABLET BY MOUTH EVERY EVENING Hoy Register, MD  Active   celecoxib (CELEBREX) 200 MG capsule [id number]  Take 1 capsule (200 mg total) by mouth 2 (two) times daily. [name], NP  Active   chlorthalidone (HYGROTON) 25 MG tablet [id number]  Take 1 tablet (25 mg total) by mouth daily. [name], NP  Active   gemfibrozil (LOPID) 600 MG tablet [id number]  Take 1 tablet (600 mg total) by mouth 2 (two) times daily before a meal. [name], NP  Active   ibuprofen (ADVIL) 800 MG tablet [id number]  Take 1 tablet (800  mg total) by mouth every 8 (eight) hours as needed for moderate pain. [name], NP  Active   lidocaine (LIDODERM) 5 % [id number]  Place 1 patch onto the skin daily. Remove & Discard patch within 12 hours or as directed by  MD [name], NP  Active   losartan (COZAAR) 100 MG tablet [id number]  TAKE ONE TABLET BY MOUTH EVERY EVENING [name], NP  Active   tadalafil (CIALIS) 10 MG tablet [id number] No Take 1 tablet (10 mg total) by mouth every other day as needed for erectile dysfunction. [name], NP Taking Active   Med List Note [name], NP 06/27/20 1021):               Patient Active Problem List   Diagnosis Date Noted   Primary osteoarthritis of left knee 08/22/2021   Primary osteoarthritis of right knee 08/22/2021   Prostate cancer (HCC) 04/04/2020   Malignant neoplasm of prostate (HCC) 01/17/2020   Acute gout due to renal impairment involving hand 08/12/2017   Essential hypertension 08/12/2017   Unilateral primary osteoarthritis, left knee 05/25/2016   Unilateral primary osteoarthritis, right knee 05/25/2016   Conditions to be addressed/monitored per PCP order:  Chronic healthcare management needs, gout, HTN, tobacco use, osteoarthritis, h/o prostate cancer  Care Plan : General Plan of Care (Adult)  Updates made by [name], RN since 10/09/2022 12:00 AM     Problem: Health Promotion or Disease Self-Management (General Plan of Care)   Priority: Medium  Onset Date: 04/25/2020     Long-Range Goal: Self-Management Plan Developed   Start Date: 04/25/2020  Expected End Date: 01/08/2023  Recent Progress: On track  Priority: Medium  Note:    Current Barriers:   Knowledge Deficits related to short term plan for care coordination needs and long term plans for chronic disease management, HTN, gout, h/o prostate cancer, tobacco use 10/09/22:  patient with right knee pain-scheduled to see ORTHO today-is taking Celebrex which helps some.  PCP f/u this week also.  BP stable-117/71 today-smokes 8 cigarettes a day  Nurse Case Manager Clinical Goal(s):  patient will work with care management team to address care coordination and chronic disease management needs  related to Disease Management Educational Needs Care Coordination Medication Management and Education Medication Reconciliation Medication Assistance  Psychosocial Support    Interventions:  Evaluation of current treatment plan and patient's adherence to plan as established by provider. Provided education to patient re: post surgical pain. Reviewed medications with patient.   [name] with Pharmacy  regarding medications. Discussed plans with patient for ongoing care management follow up and provided patient with direct contact information for care management team Reviewed scheduled/upcoming provider appointments.  Pharmacy referral for medication review-Pharmacist following Collaborated with PCP for nicotine patch to stop smoking-completed.  Hypertension Interventions:  (Status:  New goal.) Long Term Goal Last practice recorded BP readings:  BP Readings from Last 3 Encounters:        10/09/22 117/71  05/11/22         122/82 07/11/22         124/78  Most recent eGFR/CrCl:  Lab Results  Component Value Date   EGFR 100 02/14/2022    No components found for: "CRCL"  Evaluation of current treatment plan related to hypertension self management and patient's adherence to plan as established by provider Discussed plans with patient for ongoing care management follow up and provided patient with direct contact information for care management team  Advised patient, providing education and rationale, to monitor blood pressure daily and record, calling PCP for findings outside established parameters Reviewed scheduled/upcoming provider appointments including:  Assessed social determinant of health barriers  Smoking Cessation Interventions:  (Status:  New goal.) Long Term Goal Reviewed smoking history:  currently smoking 8 cigarettes a day Evaluation of current treatment plan reviewed Advised patient to discuss smoking cessation options with provider Reviewed scheduled/upcoming  provider appointments  Provided contact information for [place] Quit Line (1-800-QUIT-NOW) Discussed plans with patient for ongoing care management follow up and provided patient with direct contact information for care management team Assessed social determinant of health barriers   Self Care Activities:  Patient will self administer medications as prescribed Patient will attend all scheduled provider appointments Patient will call pharmacy for medication refills Patient will call provider office for new concerns or questions Patient will contact insurance provider for coverage of incontinence pads.  Patient Goals: Over the next 30 days, patient will attend all provider appointments. Over the next 30 days, patient will start nicotine patch.-completed Over the next 30 days, patient will speak with Pharmacist regarding medications. Over the next 30 days, patient will check blood pressure on a regular basis. . Follow Up Plan: The care management team will reach out to the patient again over the next 90 business  days.    Follow Up:  Patient agrees to Care Plan and Follow-up.  Plan: The Managed Medicaid care management team will reach out to the patient again over the next 30 business  days. and The  Patient has been provided with contact information for the Managed Medicaid care management team and has been advised to call with any health related questions or concerns.  Date/time of next scheduled RN care management/care coordination outreach: 11/09/22 at 1030

## 2022-10-09 NOTE — Addendum Note (Signed)
Addended by: Lennox Solders on: 10/09/2022 01:28 PM   Modules accepted: Orders

## 2022-10-09 NOTE — Patient Instructions (Signed)
Hi Wayne [name] you for updating me today-have a nice afternoon!  Wayne [name] was given information about Medicaid Managed Care team care coordination services as a part of their [facility] Medicaid benefit. Wayne [name] verbally consented to engagement with the [facility] Managed Care team.   If you are experiencing a medical emergency, please call 911 or report to your local emergency department or urgent care.   If you have a non-emergency medical problem during routine business hours, please contact your provider's office and ask to speak with a nurse.   For questions related to your [facility], please call: [phone] or visit the homepage here: [email or web address]  If you would like to schedule transportation through your [facility], please call the following number at least 2 days in advance of your appointment: [phone]   Rides for urgent appointments can also be made after hours by calling Member Services.  Call the Behavioral Health Crisis Line at [phone], at any time, 24 hours a day, 7 days a week. If you are in danger or need immediate medical attention call 911.  If you would like help to quit smoking, call 1-800-QUIT-NOW (717-183-3492) OR Espaol: 1-855-Djelo-Ya (1-324-401-0272) o para ms informacin haga clic aqu or Text READY to 536-644 to register via text  Wayne [name] - following are the goals we discussed in your visit today:   Goals Addressed             This Visit's Progress    Disease Progression Prevented or Minimized       10/09/22:  Patient seeing ORTHO today, has PCP f/u this week.  URO f/u scheduled.   Patient verbalizes understanding of instructions and care plan provided today and agrees to view in MyChart. Active MyChart status and patient understanding of how to access instructions and care plan via MyChart  confirmed with patient.     The Managed Medicaid care management team will reach out to the patient again over the next 30 business  days.  The  Patient has been provided with contact information for the Managed Medicaid care management team and has been advised to call with any health related questions or concerns.   [name], BSN [place]  [facility] Care Management Coordinator - Managed Medicaid High Risk [phone]   Following is a copy of your plan of care:  Care Plan : General Plan of Care (Adult)  Updates made by [name], RN since 10/09/2022 12:00 AM     Problem: Health Promotion or Disease Self-Management (General Plan of Care)   Priority: Medium  Onset Date: 04/25/2020     Long-Range Goal: Self-Management Plan Developed   Start Date: 04/25/2020  Expected End Date: 01/08/2023  Recent Progress: On track  Priority: Medium  Note:    Current Barriers:   Knowledge Deficits related to short term plan for care coordination needs and long term plans for chronic disease management, HTN, gout, h/o prostate cancer, tobacco use 10/09/22:  patient with right knee pain-scheduled to see ORTHO today-is taking Celebrex which helps some.  PCP f/u this week also.  BP stable-117/71 today-smokes 8 cigarettes a day  Nurse Case Manager Clinical Goal(s):  patient will work with care management team to address care coordination and chronic disease management needs related to Disease Management Educational Needs Care Coordination Medication Management and Education Medication Reconciliation Medication Assistance  Psychosocial Support    Interventions:  Evaluation of current treatment  plan and patient's adherence to plan as established by provider. Provided education to patient re: post surgical pain. Reviewed medications with patient.   [name] with Pharmacy  regarding medications. Discussed plans with patient for ongoing care management follow up and  provided patient with direct contact information for care management team Reviewed scheduled/upcoming provider appointments.  Pharmacy referral for medication review-Pharmacist following Collaborated with PCP for nicotine patch to stop smoking-completed.  Hypertension Interventions:  (Status:  New goal.) Long Term Goal Last practice recorded BP readings:  BP Readings from Last 3 Encounters:        10/09/22 117/71  05/11/22         122/82 07/11/22         124/78  Most recent eGFR/CrCl:  Lab Results  Component Value Date   EGFR 100 02/14/2022    No components found for: "CRCL"  Evaluation of current treatment plan related to hypertension self management and patient's adherence to plan as established by provider Discussed plans with patient for ongoing care management follow up and provided patient with direct contact information for care management team Advised patient, providing education and rationale, to monitor blood pressure daily and record, calling PCP for findings outside established parameters Reviewed scheduled/upcoming provider appointments including:  Assessed social determinant of health barriers  Smoking Cessation Interventions:  (Status:  New goal.) Long Term Goal Reviewed smoking history:  currently smoking 8 cigarettes a day Evaluation of current treatment plan reviewed Advised patient to discuss smoking cessation options with provider Reviewed scheduled/upcoming provider appointments  Provided contact information for [place] Quit Line (1-800-QUIT-NOW) Discussed plans with patient for ongoing care management follow up and provided patient with direct contact information for care management team Assessed social determinant of health barriers   Self Care Activities:  Patient will self administer medications as prescribed Patient will attend all scheduled provider appointments Patient will call pharmacy for medication refills Patient will call provider office for new  concerns or questions Patient will contact insurance provider for coverage of incontinence pads.  Patient Goals: Over the next 30 days, patient will attend all provider appointments. Over the next 30 days, patient will start nicotine patch.-completed Over the next 30 days, patient will speak with Pharmacist regarding medications. Over the next 30 days, patient will check blood pressure on a regular basis. . Follow Up Plan: The care management team will reach out to the patient again over the next 90 business  days.

## 2022-10-10 ENCOUNTER — Ambulatory Visit: Payer: Medicaid Other | Admitting: Obstetrics and Gynecology

## 2022-10-11 ENCOUNTER — Encounter (INDEPENDENT_AMBULATORY_CARE_PROVIDER_SITE_OTHER): Payer: Self-pay | Admitting: Primary Care

## 2022-10-11 ENCOUNTER — Ambulatory Visit (INDEPENDENT_AMBULATORY_CARE_PROVIDER_SITE_OTHER): Payer: Medicaid Other | Admitting: Primary Care

## 2022-10-11 VITALS — BP 130/72 | HR 62 | Resp 16 | Ht 75.0 in | Wt 231.2 lb

## 2022-10-11 DIAGNOSIS — Z8739 Personal history of other diseases of the musculoskeletal system and connective tissue: Secondary | ICD-10-CM | POA: Diagnosis not present

## 2022-10-11 DIAGNOSIS — Z1211 Encounter for screening for malignant neoplasm of colon: Secondary | ICD-10-CM | POA: Diagnosis not present

## 2022-10-11 DIAGNOSIS — I10 Essential (primary) hypertension: Secondary | ICD-10-CM | POA: Diagnosis not present

## 2022-10-11 DIAGNOSIS — R251 Tremor, unspecified: Secondary | ICD-10-CM

## 2022-10-11 DIAGNOSIS — Z760 Encounter for issue of repeat prescription: Secondary | ICD-10-CM | POA: Diagnosis not present

## 2022-10-11 MED ORDER — ATORVASTATIN CALCIUM 20 MG PO TABS: 20.0000 mg | ORAL_TABLET | Freq: Every evening | ORAL | 1 refills | Status: DC

## 2022-10-11 MED ORDER — LOSARTAN POTASSIUM 100 MG PO TABS: ORAL_TABLET | ORAL | 1 refills | Status: DC

## 2022-10-11 MED ORDER — ALLOPURINOL 300 MG PO TABS
300.0000 mg | ORAL_TABLET | Freq: Every morning | ORAL | 0 refills | Status: DC
Start: 2022-10-11 — End: 2023-01-03

## 2022-10-11 MED ORDER — AMLODIPINE BESYLATE 10 MG PO TABS
10.0000 mg | ORAL_TABLET | Freq: Every day | ORAL | 1 refills | Status: DC
Start: 2022-10-11 — End: 2022-12-28

## 2022-10-11 MED ORDER — CHLORTHALIDONE 25 MG PO TABS
25.0000 mg | ORAL_TABLET | Freq: Every day | ORAL | 1 refills | Status: DC
Start: 2022-10-11 — End: 2023-04-08

## 2022-10-11 NOTE — Progress Notes (Signed)
[facility]  Wayne [name], is a 62 y.o. male  YQI:[id number]  LOV:[id number]  DOB - 30-Oct-1960  Chief Complaint  Patient presents with   shaking hands       Subjective:   [name] is a 62 y.o. male here today for a follow an acute visit.  Appointment was initially made because his hands started shaking last week which concerned him and he made an appointment.  Today he informs me why his hands were shaking the person that normally does his yard work was not available so he decided to trim some hedges.  After a day or 2 it was self resolved.  Patient has No headache, No chest pain, No abdominal pain - No Nausea, No new weakness tingling or numbness, No Cough - shortness of breath. Just doing well he also has hypertension and checks his blood pressures at home his lowest reading has been 120- 140 systolic and diastolic has ranged from 70-84. No problems updated.  Allergies  Allergen Reactions   Hydralazine Other (See Comments)    Headache    Past Medical History:  Diagnosis Date   Arthritis    shoulders    Gout    High cholesterol    Hypertension    Prostate cancer (HCC)     Current Outpatient Medications on File Prior to Visit  Medication Sig Dispense Refill   allopurinol (ZYLOPRIM) 300 MG tablet TAKE ONE TABLET BY MOUTH EVERY MORNING 90 tablet 0   amLODipine (NORVASC) 10 MG tablet Take 1 tablet (10 mg total) by mouth daily. 90 tablet 0   atorvastatin (LIPITOR) 40 MG tablet TAKE ONE TABLET BY MOUTH EVERY EVENING 90 tablet 0   celecoxib (CELEBREX) 200 MG capsule Take 1 capsule (200 mg total) by mouth 2 (two) times daily. 120 capsule 1   chlorthalidone (HYGROTON) 25 MG tablet Take 1 tablet (25 mg total) by mouth daily. 90 tablet 1   gemfibrozil (LOPID) 600 MG tablet Take 1 tablet (600 mg total) by mouth 2 (two) times daily before a meal. 180 tablet 1   ibuprofen (ADVIL) 800 MG tablet Take 1 tablet (800 mg total) by mouth every 8 (eight) hours as  needed for moderate pain. 90 tablet O   lidocaine (LIDODERM) 5 % Place 1 patch onto the skin daily. Remove & Discard patch within 12 hours or as directed by MD 30 patch 1   losartan (COZAAR) 100 MG tablet TAKE ONE TABLET BY MOUTH EVERY EVENING 90 tablet 1   tadalafil (CIALIS) 10 MG tablet Take 1 tablet (10 mg total) by mouth every other day as needed for erectile dysfunction. 10 tablet 1   No current facility-administered medications on file prior to visit.    Objective:   Vitals:   10/11/22 1018 10/11/22 1020  BP: (!) 136/90 130/72  Pulse: 62 62  Resp: 16   SpO2: 99%   Weight: 231 lb 3.2 oz (104.9 kg)   Height: 6\' 3"  (1.905 m)     Comprehensive ROS Pertinent positive and negative noted in HPI   Exam General appearance : Awake, alert, not in any distress. Speech Clear. Not toxic looking HEENT: Atraumatic and Normocephalic, pupils equally reactive to light and accomodation Neck: Supple, no JVD. No cervical lymphadenopathy.  Chest: Good air entry bilaterally, no added sounds  CVS: S1 S2 regular, no murmurs.  Abdomen: Bowel sounds present, Non tender and not distended with no gaurding, rigidity or rebound. Extremities: B/L Lower Ext shows no edema, both legs are  warm to touch Neurology: Awake alert, and oriented X 3, CN II-XII intact, Non focal Skin: No Rash  Data Review Lab Results  Component Value Date   HGBA1C 6.5 (H) 02/14/2022    Assessment & Plan  Ahil was seen today for shaking hands.  Diagnoses and all orders for this visit:  Colon cancer screening -     Cologuard  Essential hypertension Well controlled  DIET: Limit salt intake, read nutrition labels to check salt content, limit fried and high fatty foods  Avoid using multisymptom OTC cold preparations that generally contain sudafed which can rise BP. Consult with pharmacist on best cold relief products to use for persons with HTN EXERCISE Discussed incorporating exercise such as walking - 30 minutes most  days of the week and can do in 10 minute intervals    -     amLODipine (NORVASC) 10 MG tablet; Take 1 tablet (10 mg total) by mouth daily. -     chlorthalidone (HYGROTON) 25 MG tablet; Take 1 tablet (25 mg total) by mouth daily. -     losartan (COZAAR) 100 MG tablet; TAKE ONE TABLET BY MOUTH EVERY EVENING  Medication refill -     amLODipine (NORVASC) 10 MG tablet; Take 1 tablet (10 mg total) by mouth daily. -     allopurinol (ZYLOPRIM) 300 MG tablet; Take 1 tablet (300 mg total) by mouth every morning. -     chlorthalidone (HYGROTON) 25 MG tablet; Take 1 tablet (25 mg total) by mouth daily. -     losartan (COZAAR) 100 MG tablet; TAKE ONE TABLET BY MOUTH EVERY EVENING  History of gout -     allopurinol (ZYLOPRIM) 300 MG tablet; Take 1 tablet (300 mg total) by mouth every morning.  Other orders -     atorvastatin (LIPITOR) 20 MG tablet; Take 1 tablet (20 mg total) by mouth every evening.   Tremor of both hands  Self resolved- see HPI    Patient have been counseled extensively about nutrition and exercise. Other issues discussed during this visit include: low cholesterol diet, weight control and daily exercise, foot care, annual eye examinations at Ophthalmology, importance of adherence with medications and regular follow-up. We also discussed long term complications of uncontrolled diabetes and hypertension.   No follow-ups on file.  The patient was given clear instructions to go to ER or return to medical center if symptoms don't improve, worsen or new problems develop. The patient verbalized understanding. The patient was told to call to get lab results if they haven't heard anything in the next week.   This note has been created with Education officer, environmental. Any transcriptional errors are unintentional.   [name], NP 10/11/2022, 11:10 AM

## 2022-10-15 ENCOUNTER — Ambulatory Visit: Payer: Medicaid Other | Admitting: Obstetrics and Gynecology

## 2022-10-16 ENCOUNTER — Encounter (INDEPENDENT_AMBULATORY_CARE_PROVIDER_SITE_OTHER): Payer: Self-pay | Admitting: Primary Care

## 2022-10-23 ENCOUNTER — Other Ambulatory Visit (INDEPENDENT_AMBULATORY_CARE_PROVIDER_SITE_OTHER): Payer: Self-pay | Admitting: Primary Care

## 2022-10-23 NOTE — Telephone Encounter (Signed)
Requested Prescriptions  Refused Prescriptions Disp Refills   gemfibrozil (LOPID) 600 MG tablet [Pharmacy Med Name: GEMFIBROZIL 600MG  TABLETS] 180 tablet 1    Sig: TAKE 1 TABLET(600 MG) BY MOUTH TWICE DAILY BEFORE A MEAL     Cardiovascular:  Antilipid - Fibric Acid Derivatives Failed - 10/23/2022  3:07 AM      Failed - Lipid Panel in normal range within the last 12 months    Cholesterol, Total  Date Value Ref Range Status  08/27/2022 148 100 - 199 mg/dL Final   LDL Chol Calc (NIH)  Date Value Ref Range Status  08/27/2022 78 0 - 99 mg/dL Final   HDL  Date Value Ref Range Status  08/27/2022 50 >39 mg/dL Final   Triglycerides  Date Value Ref Range Status  08/27/2022 107 0 - 149 mg/dL Final         Passed - ALT in normal range and within 360 days    ALT  Date Value Ref Range Status  08/27/2022 12 0 - 44 IU/L Final         Passed - AST in normal range and within 360 days    AST  Date Value Ref Range Status  08/27/2022 17 0 - 40 IU/L Final         Passed - Cr in normal range and within 360 days    Creatinine, Ser  Date Value Ref Range Status  08/27/2022 0.80 0.76 - 1.27 mg/dL Final         Passed - HGB in normal range and within 360 days    Hemoglobin  Date Value Ref Range Status  02/14/2022 13.5 13.0 - 17.7 g/dL Final         Passed - HCT in normal range and within 360 days    Hematocrit  Date Value Ref Range Status  02/14/2022 39.9 37.5 - 51.0 % Final         Passed - PLT in normal range and within 360 days    Platelets  Date Value Ref Range Status  02/14/2022 323 150 - 450 x10E3/uL Final         Passed - WBC in normal range and within 360 days    WBC  Date Value Ref Range Status  02/14/2022 5.3 3.4 - 10.8 x10E3/uL Final  03/30/2020 6.2 4.0 - 10.5 K/uL Final         Passed - eGFR is 30 or above and within 360 days    GFR calc Af Amer  Date Value Ref Range Status  06/10/2019 82 >59 mL/min/1.73 Final    Comment:    **[facility] currently reports eGFR in  compliance with the current**   recommendations of the [employer]. [facility] will   update reporting as new guidelines are published from the NKF-ASN   Task force.    GFR, Estimated  Date Value Ref Range Status  03/30/2020 >60 >60 mL/min Final    Comment:    (NOTE) Calculated using the CKD-EPI Creatinine Equation (2021)    eGFR  Date Value Ref Range Status  08/27/2022 101 >59 mL/min/1.73 Final         Passed - Valid encounter within last 12 months    Recent Outpatient Visits           1 week ago Colon cancer screening   [place] [facility] [name], NP   1 month ago Essential hypertension   [place] [facility] [name], NP  8 months ago Mixed hyperlipidemia   [place] [facility] [name], NP   1 year ago Medication refill   [place] [facility] [name], NP   1 year ago Blood pressure check   [place] [facility] [name], NP       Future Appointments             In 4 months Randa Evens, Kinnie Scales, NP [place] [facility]

## 2022-10-26 ENCOUNTER — Other Ambulatory Visit: Payer: Self-pay | Admitting: Primary Care

## 2022-10-26 DIAGNOSIS — Z1212 Encounter for screening for malignant neoplasm of rectum: Secondary | ICD-10-CM

## 2022-10-26 DIAGNOSIS — Z1211 Encounter for screening for malignant neoplasm of colon: Secondary | ICD-10-CM

## 2022-10-27 DIAGNOSIS — Z1211 Encounter for screening for malignant neoplasm of colon: Secondary | ICD-10-CM | POA: Diagnosis not present

## 2022-10-31 LAB — COLOGUARD: COLOGUARD: NEGATIVE

## 2022-11-09 ENCOUNTER — Other Ambulatory Visit: Payer: Self-pay | Admitting: Obstetrics and Gynecology

## 2022-11-09 NOTE — Patient Outreach (Signed)
Medicaid Managed Care   Nurse Care Manager Note  11/09/2022 Name:  Wayne [name] MRN:  [id number] DOB:  10/26/1960  Wayne [name] is an 62 y.o. year old male who is a primary patient of Wayne Sessions, NP.  The [facility] Managed Care Coordination team was consulted for assistance with:    Chronic healthcare management needs, gout, HTN, h/o prostate cancer, tobacco use, osteoarthritis  [name] was given information about Medicaid Managed Care Coordination team services today. Wayne [name] Patient agreed to services and verbal consent obtained.  Engaged with patient by telephone for follow up visit in response to provider referral for case management and/or care coordination services.   Assessments/Interventions:  Review of past medical history, allergies, medications, health status, including review of consultants reports, laboratory and other test data, was performed as part of comprehensive evaluation and provision of chronic care management services.  SDOH (Social Determinants of Health) assessments and interventions performed: SDOH Interventions    Flowsheet Row Patient Outreach Telephone from 11/09/2022 in [place] POPULATION HEALTH DEPARTMENT Patient Outreach Telephone from 10/09/2022 in [place] POPULATION HEALTH DEPARTMENT Patient Outreach Telephone from 07/11/2022 in [place] POPULATION HEALTH DEPARTMENT Patient Outreach Telephone from 05/11/2022 in [place] POPULATION HEALTH DEPARTMENT Patient Outreach Telephone from 03/14/2022 in [place] POPULATION HEALTH DEPARTMENT Patient Outreach Telephone from 01/11/2022 in [place] POPULATION HEALTH DEPARTMENT  SDOH Interventions        Food Insecurity Interventions -- -- -- -- Intervention Not Indicated --  Housing Interventions -- -- -- -- Intervention Not Indicated --  Transportation Interventions -- -- -- -- -- Intervention Not Indicated  Utilities Interventions -- -- -- Intervention Not Indicated -- --  Alcohol  Usage Interventions Intervention Not Indicated (Score <7) Intervention Not Indicated (Score <7) -- -- -- --  Financial Strain Interventions -- -- -- Intervention Not Indicated -- --  Physical Activity Interventions -- -- Intervention Not Indicated  [not able to engage in strenuous exercise due to knees] -- -- --  Stress Interventions -- -- Intervention Not Indicated -- -- --  Social Connections Interventions -- -- -- -- -- Intervention Not Indicated  Health Literacy Interventions -- Intervention Not Indicated -- -- -- --     Care Plan Allergies  Allergen Reactions   Hydralazine Other (See Comments)    Headache    Medications Reviewed Today     Reviewed by [name], RN (Registered Nurse) on 11/09/22 at 1145  Med List Status: <None>   Medication Order Taking? Sig Documenting Provider Last Dose Status Informant  allopurinol (ZYLOPRIM) 300 MG tablet [id number]  Take 1 tablet (300 mg total) by mouth every morning. Wayne Sessions, NP  Active   amLODipine (NORVASC) 10 MG tablet [id number]  Take 1 tablet (10 mg total) by mouth daily. Wayne Sessions, NP  Active   atorvastatin (LIPITOR) 20 MG tablet [id number]  Take 1 tablet (20 mg total) by mouth every evening. Wayne Sessions, NP  Active   celecoxib (CELEBREX) 200 MG capsule [id number] No Take 1 capsule (200 mg total) by mouth 2 (two) times daily. Wayne Sessions, NP Taking Active   chlorthalidone (HYGROTON) 25 MG tablet [id number]  Take 1 tablet (25 mg total) by mouth daily. Wayne Sessions, NP  Active   lidocaine (LIDODERM) 5 % [id number] No Place 1 patch onto the skin daily. Remove & Discard patch within 12 hours or as directed by MD Wayne Sessions, NP Taking Active   losartan (COZAAR)  100 MG tablet [id number]  TAKE ONE TABLET BY MOUTH EVERY [name], NP  Active   Med List Note Wayne Sessions, NP 06/27/20 1021):               Patient Active Problem List   Diagnosis Date Noted    Primary osteoarthritis of left knee 08/22/2021   Primary osteoarthritis of right knee 08/22/2021   Prostate cancer (HCC) 04/04/2020   Malignant neoplasm of prostate (HCC) 01/17/2020   Acute gout due to renal impairment involving hand 08/12/2017   Essential hypertension 08/12/2017   Unilateral primary osteoarthritis, left knee 05/25/2016   Unilateral primary osteoarthritis, right knee 05/25/2016   Conditions to be addressed/monitored per PCP order:  Chronic healthcare management needs, gout, HTN, h/o prostate cancer, tobacco use, osteoarthritis  Care Plan : General Plan of Care (Adult)  Updates made by [name], RN since 11/09/2022 12:00 AM     Problem: Health Promotion or Disease Self-Management (General Plan of Care)   Priority: Medium  Onset Date: 04/25/2020     Long-Range Goal: Self-Management Plan Developed   Start Date: 04/25/2020  Expected End Date: 02/09/2023  Recent Progress: On track  Priority: Medium  Note:    Current Barriers:   Knowledge Deficits related to short term plan for care coordination needs and long term plans for chronic disease management, HTN, gout, h/o prostate cancer, tobacco use 11/09/22:  patient states he is doing okay today.  Does not check BP at home, smokes 10 cigarettes a day.  Left knee is improved following ORTHO appt and fluid removed from knee.  Nurse Case Manager Clinical Goal(s):  patient will work with care management team to address care coordination and chronic disease management needs related to Disease Management Educational Needs Care Coordination Medication Management and Education Medication Reconciliation Medication Assistance  Psychosocial Support    Interventions:  Evaluation of current treatment plan and patient's adherence to plan as established by provider. Provided education to patient re: post surgical pain. Reviewed medications with patient.   [name] with Pharmacy  regarding medications. Discussed plans with  patient for ongoing care management follow up and provided patient with direct contact information for care management team Reviewed scheduled/upcoming provider appointments.  Pharmacy referral for medication review-Pharmacist following Collaborated with PCP for nicotine patch to stop smoking-completed.  Hypertension Interventions:  (Status:  New goal.) Long Term Goal Last practice recorded BP readings:  BP Readings from Last 3 Encounters:        10/09/22 117/71  0919/24          136/90 07/11/22         124/78  Most recent eGFR/CrCl:  Lab Results  Component Value Date   EGFR 100 02/14/2022    No components found for: "CRCL"  Evaluation of current treatment plan related to hypertension self management and patient's adherence to plan as established by provider Discussed plans with patient for ongoing care management follow up and provided patient with direct contact information for care management team Advised patient, providing education and rationale, to monitor blood pressure daily and record, calling PCP for findings outside established parameters Reviewed scheduled/upcoming provider appointments including:  Assessed social determinant of health barriers  Smoking Cessation Interventions:  (Status:  New goal.) Long Term Goal Reviewed smoking history:  currently smoking 10 cigarettes a day Evaluation of current treatment plan reviewed Advised patient to discuss smoking cessation options with provider Reviewed scheduled/upcoming provider appointments  Provided contact information for [place] Quit Line (1-800-QUIT-NOW)  Discussed plans with patient for ongoing care management follow up and provided patient with direct contact information for care management team Assessed social determinant of health barriers   Self Care Activities:  Patient will self administer medications as prescribed Patient will attend all scheduled provider appointments Patient will call pharmacy for medication  refills Patient will call provider office for new concerns or questions Patient will contact insurance provider for coverage of incontinence pads.  Patient Goals: Over the next 30 days, patient will attend all provider appointments. Over the next 30 days, patient will start nicotine patch.-completed Over the next 30 days, patient will speak with Pharmacist regarding medications. Over the next 30 days, patient will check blood pressure on a regular basis. . Follow Up Plan: The care management team will reach out to the patient again over the next 90 business  days.    Follow Up:  Patient agrees to Care Plan and Follow-up.  Plan: The Managed Medicaid care management team will reach out to the patient again over the next 30 business  days. and The  Patient has been provided with contact information for the Managed Medicaid care management team and has been advised to call with any health related questions or concerns.  Date/time of next scheduled RN care management/care coordination outreach:  01/09/23 at 1030

## 2022-11-09 NOTE — Patient Instructions (Signed)
Hi Wayne [name] you are doing well-have a great afternoon and weekend!  Wayne [name] was given information about Medicaid Managed Care team care coordination services as a part of their [facility] Medicaid benefit. Wayne [name] verbally consented to engagement with the [facility] Managed Care team.   If you are experiencing a medical emergency, please call 911 or report to your local emergency department or urgent care.   If you have a non-emergency medical problem during routine business hours, please contact your provider's office and ask to speak with a nurse.   For questions related to your [facility], please call: [phone] or visit the homepage here: [email or web address]  If you would like to schedule transportation through your [facility], please call the following number at least 2 days in advance of your appointment: [phone]   Rides for urgent appointments can also be made after hours by calling Member Services.  Call the Behavioral Health Crisis Line at [phone]) [phone], at any time, 24 hours a day, 7 days a week. If you are in danger or need immediate medical attention call 911.  If you would like help to quit smoking, call 1-800-QUIT-NOW ((508)028-3786) OR Espaol: 1-855-Djelo-Ya (3-500-938-1829) o para ms informacin haga clic aqu or Text READY to 937-169 to register via text  Wayne [name] - following are the goals we discussed in your visit today:   Goals Addressed             This Visit's Progress    Disease Progression Prevented or Minimized       11/09/22:  patient up to date on all appts-recent appts with PCP and ORTHO   Patient verbalizes understanding of instructions and care plan provided today and agrees to view in MyChart. Active MyChart status and patient understanding of how to access instructions and care plan via MyChart  confirmed with patient.     The Managed Medicaid care management team will reach out to the patient again over the next 60 business  days.  The  Patient  has been provided with contact information for the Managed Medicaid care management team and has been advised to call with any health related questions or concerns.   [name], BSN [place]  [facility] Care Management Coordinator - Managed Medicaid High Risk [phone]   Following is a copy of your plan of care:  Care Plan : General Plan of Care (Adult)  Updates made by Wayne Chandler, RN since 11/09/2022 12:00 AM     Problem: Health Promotion or Disease Self-Management (General Plan of Care)   Priority: Medium  Onset Date: 04/25/2020     Long-Range Goal: Self-Management Plan Developed   Start Date: 04/25/2020  Expected End Date: 02/09/2023  Recent Progress: On track  Priority: Medium  Note:    Current Barriers:   Knowledge Deficits related to short term plan for care coordination needs and long term plans for chronic disease management, HTN, gout, h/o prostate cancer, tobacco use 11/09/22:  patient states he is doing okay today.  Does not check BP at home, smokes 10 cigarettes a day.  Left knee is improved following ORTHO appt and fluid removed from knee.  Nurse Case Manager Clinical Goal(s):  patient will work with care management team to address care coordination and chronic disease management needs related to Disease Management Educational Needs Care Coordination Medication Management and Education Medication Reconciliation Medication Assistance  Psychosocial Support    Interventions:  Evaluation of current treatment plan and patient's adherence to plan as established by provider. Provided education to patient re: post surgical pain. Reviewed medications with patient.   [name] with Pharmacy  regarding medications. Discussed plans with patient for ongoing care management follow up and  provided patient with direct contact information for care management team Reviewed scheduled/upcoming provider appointments.  Pharmacy referral for medication review-Pharmacist following Collaborated with PCP for nicotine patch to stop smoking-completed.  Hypertension Interventions:  (Status:  New goal.) Long Term Goal Last practice recorded BP readings:  BP Readings from Last 3 Encounters:        10/09/22 117/71  0919/24          136/90 07/11/22         124/78  Most recent eGFR/CrCl:  Lab Results  Component Value Date   EGFR 100 02/14/2022    No components found for: "CRCL"  Evaluation of current treatment plan related to hypertension self management and patient's adherence to plan as established by provider Discussed plans with patient for ongoing care management follow up and provided patient with direct contact information for care management team Advised patient, providing education and rationale, to monitor blood pressure daily and record, calling PCP for findings outside established parameters Reviewed scheduled/upcoming provider appointments including:  Assessed social determinant of health barriers  Smoking Cessation Interventions:  (Status:  New goal.) Long Term Goal Reviewed smoking history:  currently smoking 10 cigarettes a day Evaluation of current treatment plan reviewed Advised patient to discuss smoking cessation options with provider Reviewed scheduled/upcoming provider appointments  Provided contact information for [place] Quit Line (1-800-QUIT-NOW) Discussed plans with patient for ongoing care management follow up and provided patient with direct contact information for care management team Assessed social determinant of health barriers   Self Care Activities:  Patient will self administer medications as prescribed Patient will attend all scheduled provider appointments Patient will call pharmacy for medication refills Patient will call provider office for new  concerns or questions Patient will contact insurance provider for coverage of incontinence pads.  Patient Goals: Over the next 30 days, patient will attend all provider appointments. Over the next 30 days, patient will start nicotine patch.-completed Over the next 30 days, patient will speak with Pharmacist regarding medications. Over the next 30 days, patient will check blood pressure on a regular basis. . Follow Up Plan: The care management team will reach out to the patient again over the next 90 business  days.

## 2022-11-20 ENCOUNTER — Other Ambulatory Visit (INDEPENDENT_AMBULATORY_CARE_PROVIDER_SITE_OTHER): Payer: Self-pay | Admitting: Primary Care

## 2022-11-21 NOTE — Telephone Encounter (Signed)
Requested medication (s) are due for refill today:yes  Requested medication (s) are on the active medication list: yes  Last refill:  08/01/21 #30 patch 1 RF  Future visit scheduled: yes  Notes to clinic:  this med was ordered 1 year ago  Unsure if could reorder it    Requested Prescriptions  Pending Prescriptions Disp Refills   lidocaine (LIDODERM) 5 % [Pharmacy Med Name: LIDOCAINE 5% PATCH] 30 patch 1    Sig: PLACE 1 PATCH ONTO THE SKIN DAILY REMOVE AND DISCARD PATCH WITHIN 12 HOURS OR AS DIRECTED     Analgesics:  Topicals Failed - 11/20/2022 11:27 PM      Failed - Manual Review: Labs are only required if the patient has taken medication for more than 8 weeks.      Passed - PLT in normal range and within 360 days    Platelets  Date Value Ref Range Status  02/14/2022 323 150 - 450 x10E3/uL Final         Passed - HGB in normal range and within 360 days    Hemoglobin  Date Value Ref Range Status  02/14/2022 13.5 13.0 - 17.7 g/dL Final         Passed - HCT in normal range and within 360 days    Hematocrit  Date Value Ref Range Status  02/14/2022 39.9 37.5 - 51.0 % Final         Passed - Cr in normal range and within 360 days    Creatinine, Ser  Date Value Ref Range Status  08/27/2022 0.80 0.76 - 1.27 mg/dL Final         Passed - eGFR is 30 or above and within 360 days    GFR calc Af Amer  Date Value Ref Range Status  06/10/2019 82 >59 mL/min/1.73 Final    Comment:    **[facility] currently reports eGFR in compliance with the current**   recommendations of the [employer]. [facility] will   update reporting as new guidelines are published from the NKF-ASN   Task force.    GFR, Estimated  Date Value Ref Range Status  03/30/2020 >60 >60 mL/min Final    Comment:    (NOTE) Calculated using the CKD-EPI Creatinine Equation (2021)    eGFR  Date Value Ref Range Status  08/27/2022 101 >59 mL/min/1.73 Final         Passed - Patient is not pregnant       Passed - Valid encounter within last 12 months    Recent Outpatient Visits           1 month ago Colon cancer screening   [place] [facility] [name], NP   2 months ago Essential hypertension   [place] [facility] [name], NP   9 months ago Mixed hyperlipidemia   [place] [facility] [name], NP   1 year ago Medication refill   [place] [facility] [name], NP   1 year ago Blood pressure check   [place] [facility] [name], NP       Future Appointments             In 3 months Randa Evens, Kinnie Scales, NP  [facility]

## 2022-11-26 NOTE — Telephone Encounter (Signed)
Will forward to provider  

## 2022-12-10 ENCOUNTER — Encounter: Payer: Self-pay | Admitting: Internal Medicine

## 2022-12-28 ENCOUNTER — Other Ambulatory Visit (INDEPENDENT_AMBULATORY_CARE_PROVIDER_SITE_OTHER): Payer: Self-pay | Admitting: Primary Care

## 2022-12-28 DIAGNOSIS — I10 Essential (primary) hypertension: Secondary | ICD-10-CM

## 2022-12-28 DIAGNOSIS — Z760 Encounter for issue of repeat prescription: Secondary | ICD-10-CM

## 2023-01-02 ENCOUNTER — Other Ambulatory Visit (INDEPENDENT_AMBULATORY_CARE_PROVIDER_SITE_OTHER): Payer: Self-pay | Admitting: Primary Care

## 2023-01-02 DIAGNOSIS — Z760 Encounter for issue of repeat prescription: Secondary | ICD-10-CM

## 2023-01-02 DIAGNOSIS — Z8739 Personal history of other diseases of the musculoskeletal system and connective tissue: Secondary | ICD-10-CM

## 2023-01-03 NOTE — Telephone Encounter (Signed)
Requested Prescriptions  Pending Prescriptions Disp Refills   allopurinol (ZYLOPRIM) 300 MG tablet [Pharmacy Med Name: allopurinol 300 mg tablet] 90 tablet 0    Sig: TAKE 1 Tablet BY MOUTH ONCE DAILY IN THE MORNING     Endocrinology:  Gout Agents - allopurinol Passed - 01/03/2023 10:08 AM      Passed - Uric Acid in normal range and within 360 days    Uric Acid  Date Value Ref Range Status  08/27/2022 3.8 3.8 - 8.4 mg/dL Final    Comment:               Therapeutic target for gout patients: <6.0         Passed - Cr in normal range and within 360 days    Creatinine, Ser  Date Value Ref Range Status  08/27/2022 0.80 0.76 - 1.27 mg/dL Final         Passed - Valid encounter within last 12 months    Recent Outpatient Visits           2 months ago Colon cancer screening   [place] [facility] [name], NP   4 months ago Essential hypertension   [place] [facility] [name], NP   10 months ago Mixed hyperlipidemia   [place] [facility] [name], NP   1 year ago Medication refill   [place] [facility] [name], NP   1 year ago Blood pressure check   [place] [facility] [name], NP       Future Appointments             In 1 month Edwards, Kinnie Scales, NP [place] [facility]            Passed - CBC within normal limits and completed in the last 12 months    WBC  Date Value Ref Range Status  02/14/2022 5.3 3.4 - 10.8 x10E3/uL Final  03/30/2020 6.2 4.0 - 10.5 K/uL Final   RBC  Date Value Ref Range Status  02/14/2022 4.58 4.14 - 5.80 x10E6/uL Final  03/30/2020 4.86 4.22 - 5.81 MIL/uL Final   Hemoglobin  Date Value Ref Range Status  02/14/2022 13.5 13.0 - 17.7 g/dL Final   Hematocrit  Date Value Ref Range Status  02/14/2022 39.9 37.5 - 51.0 % Final   MCHC  Date Value Ref Range Status   02/14/2022 33.8 31.5 - 35.7 g/dL Final  62/95/2841 32.4 30.0 - 36.0 g/dL Final   [facility]  Date Value Ref Range Status  02/14/2022 29.5 26.6 - 33.0 pg Final  03/30/2020 30.0 26.0 - 34.0 pg Final   MCV  Date Value Ref Range Status  02/14/2022 87 79 - 97 fL Final   No results found for: "PLTCOUNTKUC", "LABPLAT", "POCPLA" RDW  Date Value Ref Range Status  02/14/2022 14.5 11.6 - 15.4 % Final

## 2023-01-09 ENCOUNTER — Other Ambulatory Visit: Payer: Self-pay | Admitting: Obstetrics and Gynecology

## 2023-01-09 NOTE — Patient Outreach (Signed)
Medicaid Managed Care   Nurse Care Manager Note  01/09/2023 Name:  Wayne [name] MRN:  [id number] DOB:  05/29/1960  Wayne [name] is an 62 y.o. year old male who is a primary patient of Wayne Sessions, NP.  The [facility] Managed Care Coordination team was consulted for assistance with:    Chronic healthcare management needs, tobacco use, HTN, h/o prostate cancer, gout, osteoarthritis  Mr. Covington was given information about Medicaid Managed Care Coordination team services today. Wayne [name] Patient agreed to services and verbal consent obtained.  Engaged with patient by telephone for follow up visit in response to provider referral for case management and/or care coordination services.   Patient is participating in a Managed Medicaid Plan:  Yes  Assessments/Interventions:  Review of past medical history, allergies, medications, health status, including review of consultants reports, laboratory and other test data, was performed as part of comprehensive evaluation and provision of chronic care management services.  SDOH (Social Drivers of Health) assessments and interventions performed: SDOH Interventions    Flowsheet Row Patient Outreach Telephone from 01/09/2023 in [place] POPULATION HEALTH DEPARTMENT Patient Outreach Telephone from 11/09/2022 in [place] POPULATION HEALTH DEPARTMENT Patient Outreach Telephone from 10/09/2022 in [place] POPULATION HEALTH DEPARTMENT Patient Outreach Telephone from 07/11/2022 in [place] POPULATION HEALTH DEPARTMENT Patient Outreach Telephone from 05/11/2022 in [place] POPULATION HEALTH DEPARTMENT Patient Outreach Telephone from 03/14/2022 in [place] POPULATION HEALTH DEPARTMENT  SDOH Interventions        Food Insecurity Interventions -- -- -- -- -- Intervention Not Indicated  Housing Interventions -- -- -- -- -- Intervention Not Indicated  Transportation Interventions Intervention Not Indicated -- -- -- -- --  Utilities  Interventions -- -- -- -- Intervention Not Indicated --  Alcohol Usage Interventions -- Intervention Not Indicated (Score <7) Intervention Not Indicated (Score <7) -- -- --  Financial Strain Interventions -- -- -- -- Intervention Not Indicated --  Physical Activity Interventions -- -- -- Intervention Not Indicated  [not able to engage in strenuous exercise due to knees] -- --  Stress Interventions -- -- -- Intervention Not Indicated -- --  Social Connections Interventions Intervention Not Indicated -- -- -- -- --  Health Literacy Interventions -- -- Intervention Not Indicated -- -- --     Care Plan Allergies  Allergen Reactions   Hydralazine Other (See Comments)    Headache    Medications Reviewed Today     Reviewed by Wayne Chandler, RN (Registered Nurse) on 01/09/23 at [phone]  Med List Status: <None>   Medication Order Taking? Sig Documenting Provider Last Dose Status Informant  allopurinol (ZYLOPRIM) 300 MG tablet [id number]  TAKE 1 Tablet BY MOUTH ONCE DAILY IN THE Wayne Hughs, NP  Active   amLODipine (NORVASC) 10 MG tablet [id number]  TAKE 1 TABLET(10 MG) BY MOUTH DAILY Wayne Sessions, NP  Active   atorvastatin (LIPITOR) 20 MG tablet [id number]  Take 1 tablet (20 mg total) by mouth every evening. Wayne Sessions, NP  Active   celecoxib (CELEBREX) 200 MG capsule [id number] No Take 1 capsule (200 mg total) by mouth 2 (two) times daily. Wayne Sessions, NP Taking Active   chlorthalidone (HYGROTON) 25 MG tablet [id number]  Take 1 tablet (25 mg total) by mouth daily. Wayne Sessions, NP  Active   lidocaine (LIDODERM) 5 % [id number] No Place 1 patch onto the skin daily. Remove & Discard patch within 12 hours or as directed by MD Wayne [name],  Wayne Scales, NP Taking Active   losartan (COZAAR) 100 MG tablet [id number]  TAKE ONE TABLET BY MOUTH EVERY Wayne Radar, NP  Active   Med List Note Wayne Sessions, NP 06/27/20 1021):                Patient Active Problem List   Diagnosis Date Noted   Primary osteoarthritis of left knee 08/22/2021   Primary osteoarthritis of right knee 08/22/2021   Prostate cancer (HCC) 04/04/2020   Malignant neoplasm of prostate (HCC) 01/17/2020   Acute gout due to renal impairment involving hand 08/12/2017   Essential hypertension 08/12/2017   Unilateral primary osteoarthritis, left knee 05/25/2016   Unilateral primary osteoarthritis, right knee 05/25/2016   Conditions to be addressed/monitored per PCP order:  Chronic healthcare management needs, tobacco use, HTN, h/o prostate cancer, gout, osteoarthritis  Care Plan : General Plan of Care (Adult)  Updates made by Wayne Chandler, RN since 01/09/2023 12:00 AM     Problem: Health Promotion or Disease Self-Management (General Plan of Care)   Priority: Medium  Onset Date: 04/25/2020     Long-Range Goal: Self-Management Plan Developed   Start Date: 04/25/2020  Expected End Date: 02/09/2023  Recent Progress: On track  Priority: Medium  Note:    Current Barriers:   Knowledge Deficits related to short term plan for care coordination needs and long term plans for chronic disease management, HTN, gout, h/o prostate cancer, tobacco use 01/09/23:  No complaints today. Does not check BP, smokes 8 cigarettes a day.  Has PCP and URO f/u in Feb.  States [facility] Medicaid will stop end of month-states he will f/u with them to verify.    Nurse Case Manager Clinical Goal(s):  patient will work with care management team to address care coordination and chronic disease management needs related to Disease Management Educational Needs Care Coordination Medication Management and Education Medication Reconciliation Medication Assistance  Psychosocial Support    Interventions:  Evaluation of current treatment plan and patient's adherence to plan as established by provider. Provided education to patient re: post surgical pain. Reviewed medications with patient.    Wayne [name] with Pharmacy  regarding medications. Discussed plans with patient for ongoing care management follow up and provided patient with direct contact information for care management team Reviewed scheduled/upcoming provider appointments.  Pharmacy referral for medication review-Pharmacist following Collaborated with PCP for nicotine patch to stop smoking-completed.  Hypertension Interventions:  (Status:  New goal.) Long Term Goal Last practice recorded BP readings:  BP Readings from Last 3 Encounters:        10/09/22 117/71  0919/24          136/90 07/11/22         124/78  Most recent eGFR/CrCl:  Lab Results  Component Value Date   EGFR 100 02/14/2022    No components found for: "CRCL"  Evaluation of current treatment plan related to hypertension self management and patient's adherence to plan as established by provider Discussed plans with patient for ongoing care management follow up and provided patient with direct contact information for care management team Advised patient, providing education and rationale, to monitor blood pressure daily and record, calling PCP for findings outside established parameters Reviewed scheduled/upcoming provider appointments including:  Assessed social determinant of health barriers  Smoking Cessation Interventions:  (Status:  New goal.) Long Term Goal Reviewed smoking history:  currently smoking 10 cigarettes a day Evaluation of current treatment plan reviewed Advised patient to discuss smoking cessation options  with provider Reviewed scheduled/upcoming provider appointments  Provided contact information for  Quit Line (1-800-QUIT-NOW) Discussed plans with patient for ongoing care management follow up and provided patient with direct contact information for care management team Assessed social determinant of health barriers   Self Care Activities:  Patient will self administer medications as prescribed Patient will attend all  scheduled provider appointments Patient will call pharmacy for medication refills Patient will call provider office for new concerns or questions Patient will contact insurance provider for coverage of incontinence pads 01/09/23:  patient to f/u with [facility] regarding coverage.  Encouraged patient to call back if needs assistance.  Patient Goals: Over the next 30 days, patient will attend all provider appointments.-completed Over the next 30 days, patient will start nicotine patch.-completed Over the next 30 days, patient will speak with Pharmacist regarding medications. Over the next 30 days, patient will check blood pressure on a regular basis. . Follow Up Plan: The care management team will reach out to the patient again over the next 90 business  days.    Follow Up:  Patient agrees to Care Plan and Follow-up.  Plan: The Managed Medicaid care management team will reach out to the patient again over the next 60 business  days. and The  Patient has been provided with contact information for the Managed Medicaid care management team and has been advised to call with any health related questions or concerns.  Date/time of next scheduled RN care management/care coordination outreach:  02/10/23 at 315

## 2023-01-09 NOTE — Patient Instructions (Signed)
Hi Wayne [name], Hyneman you are doing okay today-check on the [employer].  [name] was given information about Medicaid Managed Care team care coordination services as a part of their [facility] Community Plan Medicaid benefit. Wayne [name] verbally consented to engagement with the [facility] Managed Care team.   If you are experiencing a medical emergency, please call 911 or report to your local emergency department or urgent care.   If you have a non-emergency medical problem during routine business hours, please contact your provider's office and ask to speak with a nurse.   For questions related to your [facility], please call: [phone] or visit the homepage here: [email or web address]  If you would like to schedule transportation through your [facility], please call the following number at least 2 days in advance of your appointment: [phone]   Rides for urgent appointments can also be made after hours by calling Member Services.  Call the Behavioral Health Crisis Line at [phone]) [phone], at any time, 24 hours a day, 7 days a week. If you are in danger or need immediate medical attention call 911.  If you would like help to quit smoking, call 1-800-QUIT-NOW (573-117-0054) OR Espaol: 1-855-Djelo-Ya (1-324-401-0272) o para ms informacin haga clic aqu or Text READY to 536-644 to register via text  Wayne [name] - following are the goals we discussed in your visit today:   Goals Addressed             This Visit's Progress    Disease Progression Prevented or Minimized       01/09/23:  PCP and URO appt in Feb.   Patient verbalizes understanding of instructions and care plan provided today and agrees to view in MyChart. Active MyChart status and patient understanding of how to access instructions and care plan via MyChart confirmed with patient.      The Managed Medicaid care management team will reach out to the patient again over the next 60 business  days.  The  Patient  has been provided with contact information for the Managed Medicaid care management team and has been advised to call with any health related questions or concerns.   [name], BSN [place]  [facility] Care Management Coordinator - Managed Medicaid High Risk [phone]   Following is a copy of your plan of care:  Care Plan : General Plan of Care (Adult)  Updates made by [name], RN since 01/09/2023 12:00 AM     Problem: Health Promotion or Disease Self-Management (General Plan of Care)   Priority: Medium  Onset Date: 04/25/2020     Long-Range Goal: Self-Management Plan Developed   Start Date: 04/25/2020  Expected End Date: 02/09/2023  Recent Progress: On track  Priority: Medium  Note:    Current Barriers:   Knowledge Deficits related to short term plan for care coordination needs and long term plans for chronic disease management, HTN, gout, h/o prostate cancer, tobacco use 01/09/23:  No complaints today. Does not check BP, smokes 8 cigarettes a day.  Has PCP and URO f/u in Feb.  States [facility] Medicaid will stop end of month-states he will f/u with them to verify.    Nurse Case Manager Clinical Goal(s):  patient will work with care management team to address care coordination and chronic disease management needs related to Disease Management Educational Needs Care Coordination Medication Management and Education Medication Reconciliation Medication Assistance  Psychosocial Support  Interventions:  Evaluation of current treatment plan and patient's adherence to plan as established by provider. Provided education to patient re: post surgical pain. Reviewed medications with patient.   [name] with Pharmacy  regarding medications. Discussed plans with patient for ongoing care management follow up and provided patient  with direct contact information for care management team Reviewed scheduled/upcoming provider appointments.  Pharmacy referral for medication review-Pharmacist following Collaborated with PCP for nicotine patch to stop smoking-completed.  Hypertension Interventions:  (Status:  New goal.) Long Term Goal Last practice recorded BP readings:  BP Readings from Last 3 Encounters:        10/09/22 117/71  0919/24          136/90 07/11/22         124/78  Most recent eGFR/CrCl:  Lab Results  Component Value Date   EGFR 100 02/14/2022    No components found for: "CRCL"  Evaluation of current treatment plan related to hypertension self management and patient's adherence to plan as established by provider Discussed plans with patient for ongoing care management follow up and provided patient with direct contact information for care management team Advised patient, providing education and rationale, to monitor blood pressure daily and record, calling PCP for findings outside established parameters Reviewed scheduled/upcoming provider appointments including:  Assessed social determinant of health barriers  Smoking Cessation Interventions:  (Status:  New goal.) Long Term Goal Reviewed smoking history:  currently smoking 10 cigarettes a day Evaluation of current treatment plan reviewed Advised patient to discuss smoking cessation options with provider Reviewed scheduled/upcoming provider appointments  Provided contact information for [place] Quit Line (1-800-QUIT-NOW) Discussed plans with patient for ongoing care management follow up and provided patient with direct contact information for care management team Assessed social determinant of health barriers   Self Care Activities:  Patient will self administer medications as prescribed Patient will attend all scheduled provider appointments Patient will call pharmacy for medication refills Patient will call provider office for new concerns or  questions Patient will contact insurance provider for coverage of incontinence pads 01/09/23:  patient to f/u with [facility] MM regarding coverage.  Encouraged patient to call back if needs assistance.  Patient Goals: Over the next 30 days, patient will attend all provider appointments.-completed Over the next 30 days, patient will start nicotine patch.-completed Over the next 30 days, patient will speak with Pharmacist regarding medications. Over the next 30 days, patient will check blood pressure on a regular basis. . Follow Up Plan: The care management team will reach out to the patient again over the next 90 business  days.

## 2023-01-26 DIAGNOSIS — S0990XD Unspecified injury of head, subsequent encounter: Secondary | ICD-10-CM | POA: Diagnosis not present

## 2023-01-26 DIAGNOSIS — M549 Dorsalgia, unspecified: Secondary | ICD-10-CM | POA: Diagnosis not present

## 2023-01-26 DIAGNOSIS — T1490XA Injury, unspecified, initial encounter: Secondary | ICD-10-CM | POA: Diagnosis not present

## 2023-01-26 DIAGNOSIS — S3992XA Unspecified injury of lower back, initial encounter: Secondary | ICD-10-CM | POA: Diagnosis not present

## 2023-02-11 ENCOUNTER — Other Ambulatory Visit: Payer: Self-pay | Admitting: Obstetrics and Gynecology

## 2023-02-11 NOTE — Patient Instructions (Signed)
Hi Wayne [name], I am sorry I missed you today-I hope you are okay.as a part of your Medicaid benefit, you are eligible for care management and care coordination services at no cost or copay. I was unable to reach you by phone today but would be happy to help you with your health related needs. Please feel free to call me at [phone].  A member of the Managed Medicaid care management team will reach out to you again over the next 30 business  days.   [name], BSN, [employer] [facility] Health RN Care Manager Direct Dial [phone]/GNF [phone] Website: [email or web address]

## 2023-02-11 NOTE — Patient Outreach (Signed)
  Medicaid Managed Care   Unsuccessful Attempt Note   02/11/2023 Name: Wayne [name] MRN: [id number] DOB: 22-Feb-1960  Referred by: [name], NP Reason for referral : High Risk Managed Medicaid (Unsuccessful telephone outreach)  An unsuccessful telephone outreach was attempted today. The patient was referred to the case management team for assistance with care management and care coordination.    Follow Up Plan: The Managed Medicaid care management team will reach out to the patient again over the next 30 business  days. and The  Patient has been provided with contact information for the Managed Medicaid care management team and has been advised to call with any health related questions or concerns.   [name], BSN, [employer] [facility] Health RN Care Manager Direct Dial [phone]/AOZ [phone] Website: [email or web address]

## 2023-02-19 ENCOUNTER — Other Ambulatory Visit: Payer: Self-pay | Admitting: Obstetrics and Gynecology

## 2023-02-19 NOTE — Patient Instructions (Signed)
Hi Mr. Gasparini, thank you for speaking with me today-have a great day!  Mr. Armwood was given information about Medicaid Managed Care team care coordination services as a part of their [facility] Community Plan Medicaid benefit. [name] verbally consented to engagement with the [facility] Managed Care team.   If you are experiencing a medical emergency, please call 911 or report to your local emergency department or urgent care.   If you have a non-emergency medical problem during routine business hours, please contact your provider's office and ask to speak with a nurse.   For questions related to your [facility], please call: [phone] or visit the homepage here: [email or web address]  If you would like to schedule transportation through your [facility], please call the following number at least 2 days in advance of your appointment: [phone]   Rides for urgent appointments can also be made after hours by calling Member Services.  Call the Behavioral Health Crisis Line at [phone], at any time, 24 hours a day, 7 days a week. If you are in danger or need immediate medical attention call 911.  If you would like help to quit smoking, call 1-800-QUIT-NOW (802-032-7695) OR Espaol: 1-855-Djelo-Ya (1-324-401-0272) o para ms informacin haga clic aqu or Text READY to 536-644 to register via text  Mr. Jarrett - following are the goals we discussed in your visit today:   Goals Addressed             This Visit's Progress    Disease Progression Prevented or Minimized       02/19/23: PCP and URO appt in Feb.   Patient verbalizes understanding of instructions and care plan provided today and agrees to view in MyChart. Active MyChart status and patient understanding of how to access instructions and care plan via MyChart confirmed with patient.     The Managed Medicaid  care management team will reach out to the patient again over the next 60 business days.  The  Patient has been provided with contact information for the Managed Medicaid care management team and has been advised to call with any health related questions or concerns.   [name],BSN,CM Value-Based Care Institute [facility] Health RN Care Manager Direct Dial [phone]/LOV [phone] Website: [email or web address]   Following is a copy of your plan of care:  Care Plan : General Plan of Care (Adult)  Updates made by [name], RN since 02/19/2023 12:00 AM     Problem: Health Promotion or Disease Self-Management (General Plan of Care)   Priority: Medium  Onset Date: 04/25/2020     Long-Range Goal: Self-Management Plan Developed   Start Date: 04/25/2020  Expected End Date: 02/09/2023  Recent Progress: On track  Priority: Medium  Note:    Current Barriers:   Knowledge Deficits related to short term plan for care coordination needs and long term plans for chronic disease management, HTN, gout, h/o prostate cancer, tobacco use 02/19/23: BP stable.  Still has [facility] Managed Medicaid plan per patient.  Smokes 8 cigarettes a day and has PCP appt 02/27/23.  URO f/u in February.  Nurse Case Manager Clinical Goal(s):  patient will work with care management team to address care coordination and chronic disease management needs related to Disease Management Educational Needs Care Coordination Medication Management and Education Medication Reconciliation Medication Assistance  Psychosocial Support    Interventions:  Evaluation of current treatment plan and patient's adherence to plan as established by provider. Provided  education to patient re: post surgical pain. Reviewed medications with patient.   [name] with Pharmacy  regarding medications. Discussed plans with patient for ongoing care management follow up and provided patient with direct contact information for care management  team Reviewed scheduled/upcoming provider appointments.  Pharmacy referral for medication review-Pharmacist following Collaborated with PCP for nicotine patch to stop smoking-completed.  Hypertension Interventions:  (Status:  New goal.) Long Term Goal Last practice recorded BP readings:  BP Readings from Last 3 Encounters:        10/09/22 117/71  0919/24          136/90 02/19/23         122/84  Most recent eGFR/CrCl:  Lab Results  Component Value Date   EGFR 100 02/14/2022    No components found for: "CRCL"  Evaluation of current treatment plan related to hypertension self management and patient's adherence to plan as established by provider Discussed plans with patient for ongoing care management follow up and provided patient with direct contact information for care management team Advised patient, providing education and rationale, to monitor blood pressure daily and record, calling PCP for findings outside established parameters Reviewed scheduled/upcoming provider appointments including:  Assessed social determinant of health barriers  Smoking Cessation Interventions:  (Status:  New goal.) Long Term Goal Reviewed smoking history:  currently smoking 8 cigarettes a day Evaluation of current treatment plan reviewed Advised patient to discuss smoking cessation options with provider Reviewed scheduled/upcoming provider appointments  Provided contact information for [place] Quit Line (1-800-QUIT-NOW) Discussed plans with patient for ongoing care management follow up and provided patient with direct contact information for care management team Assessed social determinant of health barriers   Self Care Activities:  Patient will self administer medications as prescribed Patient will attend all scheduled provider appointments Patient will call pharmacy for medication refills Patient will call provider office for new concerns or questions Patient will contact insurance provider for  coverage of incontinence pads 01/09/23:  patient to f/u with [facility] MM regarding coverage.  Encouraged patient to call back if needs assistance-completed  Patient Goals: Over the next 30 days, patient will attend all provider appointments.-completed Over the next 30 days, patient will start nicotine patch.-completed Over the next 30 days, patient will speak with Pharmacist regarding medications. Over the next 30 days, patient will check blood pressure on a regular basis. . Follow Up Plan: The care management team will reach out to the patient again over the next 60 business  days.

## 2023-02-19 NOTE — Patient Outreach (Signed)
Medicaid Managed Care   Nurse Care Manager Note  02/19/2023 Name:  Wayne [name] MRN:  [id number] DOB:  04-May-1960  Wayne [name] is an 63 y.o. year old male who is a primary patient of [name], NP.  The [facility] Managed Care Coordination team was consulted for assistance with:    Chronic healthcare management needs, tobacco use, HTN, gout, osteoarthritis, h/o prostate cancer  Mr. Seppala was given information about Medicaid Managed Care Coordination team services today. Wayne [name] Patient agreed to services and verbal consent obtained.  Engaged with patient by telephone for follow up visit in response to provider referral for case management and/or care coordination services.   Patient is participating in a Managed Medicaid Plan:  Yes  Assessments/Interventions:  Review of past medical history, allergies, medications, health status, including review of consultants reports, laboratory and other test data, was performed as part of comprehensive evaluation and provision of chronic care management services.  SDOH (Social Drivers of Health) assessments and interventions performed: SDOH Interventions    Flowsheet Row Patient Outreach Telephone from 02/19/2023 in [place] [facility] POPULATION HEALTH DEPARTMENT Patient Outreach Telephone from 01/09/2023 in [place] POPULATION HEALTH DEPARTMENT Patient Outreach Telephone from 11/09/2022 in [place] POPULATION HEALTH DEPARTMENT Patient Outreach Telephone from 10/09/2022 in [place] POPULATION HEALTH DEPARTMENT Patient Outreach Telephone from 07/11/2022 in [place] POPULATION HEALTH DEPARTMENT Patient Outreach Telephone from 05/11/2022 in [place] POPULATION HEALTH DEPARTMENT  SDOH Interventions        Food Insecurity Interventions Intervention Not Indicated -- -- -- -- --  Housing Interventions Intervention Not Indicated -- -- -- -- --  Transportation Interventions -- Intervention Not Indicated -- -- -- --  Utilities  Interventions -- -- -- -- -- Intervention Not Indicated  Alcohol Usage Interventions -- -- Intervention Not Indicated (Score <7) Intervention Not Indicated (Score <7) -- --  Financial Strain Interventions -- -- -- -- -- Intervention Not Indicated  Physical Activity Interventions -- -- -- -- Intervention Not Indicated  [not able to engage in strenuous exercise due to knees] --  Stress Interventions -- -- -- -- Intervention Not Indicated --  Social Connections Interventions -- Intervention Not Indicated -- -- -- --  Health Literacy Interventions -- -- -- Intervention Not Indicated -- --     Care Plan Allergies  Allergen Reactions   Hydralazine Other (See Comments)    Headache   Medications Reviewed Today     Reviewed by [name], RN (Registered Nurse) on 02/19/23 at [phone]  Med List Status: <None>   Medication Order Taking? Sig Documenting Provider Last Dose Status Informant  allopurinol (ZYLOPRIM) 300 MG tablet [id number]  TAKE 1 Tablet BY MOUTH ONCE DAILY IN THE Sandrea Hughs, NP  Active   amLODipine (NORVASC) 10 MG tablet [id number]  TAKE 1 TABLET(10 MG) BY MOUTH DAILY [name], NP  Active   atorvastatin (LIPITOR) 20 MG tablet [id number]  Take 1 tablet (20 mg total) by mouth every evening. [name], NP  Active   celecoxib (CELEBREX) 200 MG capsule [id number] No Take 1 capsule (200 mg total) by mouth 2 (two) times daily. [name], NP Taking Active   chlorthalidone (HYGROTON) 25 MG tablet [id number]  Take 1 tablet (25 mg total) by mouth daily. [name], NP  Active   lidocaine (LIDODERM) 5 % [id number] No Place 1 patch onto the skin daily. Remove & Discard patch within 12 hours or as directed by MD Gwinda Passe  P, NP Taking Active   losartan (COZAAR) 100 MG tablet [id number]  TAKE ONE TABLET BY MOUTH EVERY [name], NP  Active   Med List Note [name], NP 06/27/20 1021):               Patient  Active Problem List   Diagnosis Date Noted   Primary osteoarthritis of left knee 08/22/2021   Primary osteoarthritis of right knee 08/22/2021   Prostate cancer (HCC) 04/04/2020   Malignant neoplasm of prostate (HCC) 01/17/2020   Acute gout due to renal impairment involving hand 08/12/2017   Essential hypertension 08/12/2017   Unilateral primary osteoarthritis, left knee 05/25/2016   Unilateral primary osteoarthritis, right knee 05/25/2016   Conditions to be addressed/monitored per PCP order:  Chronic healthcare management needs, tobacco use, HTN, gout, osteoarthritis, h/o prostate cancer  Care Plan : General Plan of Care (Adult)  Updates made by [name], RN since 02/19/2023 12:00 AM     Problem: Health Promotion or Disease Self-Management (General Plan of Care)   Priority: Medium  Onset Date: 04/25/2020     Long-Range Goal: Self-Management Plan Developed   Start Date: 04/25/2020  Expected End Date: 02/09/2023  Recent Progress: On track  Priority: Medium  Note:    Current Barriers:   Knowledge Deficits related to short term plan for care coordination needs and long term plans for chronic disease management, HTN, gout, h/o prostate cancer, tobacco use 02/19/23: BP stable.  Still has [facility] Managed Medicaid plan per patient.  Smokes 8 cigarettes a day and has PCP appt 02/27/23.  URO f/u in February.  Nurse Case Manager Clinical Goal(s):  patient will work with care management team to address care coordination and chronic disease management needs related to Disease Management Educational Needs Care Coordination Medication Management and Education Medication Reconciliation Medication Assistance  Psychosocial Support    Interventions:  Evaluation of current treatment plan and patient's adherence to plan as established by provider. Provided education to patient re: post surgical pain. Reviewed medications with patient.   [name] with Pharmacy  regarding medications. Discussed  plans with patient for ongoing care management follow up and provided patient with direct contact information for care management team Reviewed scheduled/upcoming provider appointments.  Pharmacy referral for medication review-Pharmacist following Collaborated with PCP for nicotine patch to stop smoking-completed.  Hypertension Interventions:  (Status:  New goal.) Long Term Goal Last practice recorded BP readings:  BP Readings from Last 3 Encounters:        10/09/22 117/71  0919/24          136/90 02/19/23         122/84  Most recent eGFR/CrCl:  Lab Results  Component Value Date   EGFR 100 02/14/2022    No components found for: "CRCL"  Evaluation of current treatment plan related to hypertension self management and patient's adherence to plan as established by provider Discussed plans with patient for ongoing care management follow up and provided patient with direct contact information for care management team Advised patient, providing education and rationale, to monitor blood pressure daily and record, calling PCP for findings outside established parameters Reviewed scheduled/upcoming provider appointments including:  Assessed social determinant of health barriers  Smoking Cessation Interventions:  (Status:  New goal.) Long Term Goal Reviewed smoking history:  currently smoking 8 cigarettes a day Evaluation of current treatment plan reviewed Advised patient to discuss smoking cessation options with provider Reviewed scheduled/upcoming provider appointments  Provided contact information for [place] Quit  Line (1-800-QUIT-NOW) Discussed plans with patient for ongoing care management follow up and provided patient with direct contact information for care management team Assessed social determinant of health barriers   Self Care Activities:  Patient will self administer medications as prescribed Patient will attend all scheduled provider appointments Patient will call pharmacy for  medication refills Patient will call provider office for new concerns or questions Patient will contact insurance provider for coverage of incontinence pads 01/09/23:  patient to f/u with [facility] MM regarding coverage.  Encouraged patient to call back if needs assistance-completed  Patient Goals: Over the next 30 days, patient will attend all provider appointments.-completed Over the next 30 days, patient will start nicotine patch.-completed Over the next 30 days, patient will speak with Pharmacist regarding medications. Over the next 30 days, patient will check blood pressure on a regular basis. . Follow Up Plan: The care management team will reach out to the patient again over the next 90 business  days.    Follow Up:  Patient agrees to Care Plan and Follow-up.  Plan: The Managed Medicaid care management team will reach out to the patient again over the next 60 business days. and The  Patient has been provided with contact information for the Managed Medicaid care management team and has been advised to call with any health related questions or concerns.  Date/time of next scheduled RN care management/care coordination outreach:  04/19/23 at 0900.

## 2023-02-27 ENCOUNTER — Encounter (INDEPENDENT_AMBULATORY_CARE_PROVIDER_SITE_OTHER): Payer: Self-pay | Admitting: Primary Care

## 2023-02-27 ENCOUNTER — Ambulatory Visit (INDEPENDENT_AMBULATORY_CARE_PROVIDER_SITE_OTHER): Payer: Medicaid Other | Admitting: Primary Care

## 2023-02-27 VITALS — BP 157/75 | HR 58 | Resp 16 | Ht 75.0 in | Wt 235.4 lb

## 2023-02-27 DIAGNOSIS — I10 Essential (primary) hypertension: Secondary | ICD-10-CM | POA: Diagnosis not present

## 2023-02-27 DIAGNOSIS — E782 Mixed hyperlipidemia: Secondary | ICD-10-CM

## 2023-02-27 DIAGNOSIS — Z2821 Immunization not carried out because of patient refusal: Secondary | ICD-10-CM

## 2023-02-27 MED ORDER — IBUPROFEN 800 MG PO TABS: 800.0000 mg | ORAL_TABLET | Freq: Three times a day (TID) | ORAL | 0 refills | Status: DC | PRN

## 2023-02-28 LAB — CBC WITH DIFFERENTIAL/PLATELET
Basophils Absolute: 0 10*3/uL (ref 0.0–0.2)
Basos: 1 %
EOS (ABSOLUTE): 0.1 10*3/uL (ref 0.0–0.4)
Eos: 3 %
Hematocrit: 43.3 % (ref 37.5–51.0)
Hemoglobin: 14.1 g/dL (ref 13.0–17.7)
Immature Grans (Abs): 0 10*3/uL (ref 0.0–0.1)
Immature Granulocytes: 0 %
Lymphocytes Absolute: 1.6 10*3/uL (ref 0.7–3.1)
Lymphs: 28 %
MCH: 29.6 pg (ref 26.6–33.0)
MCHC: 32.6 g/dL (ref 31.5–35.7)
MCV: 91 fL (ref 79–97)
Monocytes Absolute: 0.5 10*3/uL (ref 0.1–0.9)
Monocytes: 9 %
Neutrophils Absolute: 3.4 10*3/uL (ref 1.4–7.0)
Neutrophils: 59 %
Platelets: 307 10*3/uL (ref 150–450)
RBC: 4.77 x10E6/uL (ref 4.14–5.80)
RDW: 14.6 % (ref 11.6–15.4)
WBC: 5.6 10*3/uL (ref 3.4–10.8)

## 2023-02-28 LAB — LIPID PANEL
Chol/HDL Ratio: 4.2 {ratio} (ref 0.0–5.0)
Cholesterol, Total: 163 mg/dL (ref 100–199)
HDL: 39 mg/dL — ABNORMAL LOW (ref >39)
LDL Chol Calc (NIH): 92 mg/dL (ref 0–99)
Triglycerides: 187 mg/dL — ABNORMAL HIGH (ref 0–149)
VLDL Cholesterol Cal: 32 mg/dL (ref 5–40)

## 2023-02-28 LAB — CMP14+EGFR
ALT: 16 [IU]/L (ref 0–44)
AST: 19 [IU]/L (ref 0–40)
Albumin: 4.3 g/dL (ref 3.9–4.9)
Alkaline Phosphatase: 128 [IU]/L — ABNORMAL HIGH (ref 44–121)
BUN/Creatinine Ratio: 13 (ref 10–24)
BUN: 12 mg/dL (ref 8–27)
Bilirubin Total: 0.3 mg/dL (ref 0.0–1.2)
CO2: 24 mmol/L (ref 20–29)
Calcium: 9.5 mg/dL (ref 8.6–10.2)
Chloride: 99 mmol/L (ref 96–106)
Creatinine, Ser: 0.91 mg/dL (ref 0.76–1.27)
Globulin, Total: 3.3 g/dL (ref 1.5–4.5)
Glucose: 101 mg/dL — ABNORMAL HIGH (ref 70–99)
Potassium: 3.9 mmol/L (ref 3.5–5.2)
Sodium: 142 mmol/L (ref 134–144)
Total Protein: 7.6 g/dL (ref 6.0–8.5)
eGFR: 95 mL/min/{1.73_m2} (ref >59)

## 2023-03-05 ENCOUNTER — Other Ambulatory Visit (INDEPENDENT_AMBULATORY_CARE_PROVIDER_SITE_OTHER): Payer: Self-pay | Admitting: Primary Care

## 2023-03-05 ENCOUNTER — Encounter (INDEPENDENT_AMBULATORY_CARE_PROVIDER_SITE_OTHER): Payer: Self-pay | Admitting: Primary Care

## 2023-03-05 DIAGNOSIS — E782 Mixed hyperlipidemia: Secondary | ICD-10-CM

## 2023-03-05 MED ORDER — GEMFIBROZIL 600 MG PO TABS: 600.0000 mg | ORAL_TABLET | Freq: Two times a day (BID) | ORAL | 1 refills | Status: DC

## 2023-03-08 ENCOUNTER — Encounter (INDEPENDENT_AMBULATORY_CARE_PROVIDER_SITE_OTHER): Payer: Self-pay | Admitting: Primary Care

## 2023-03-09 ENCOUNTER — Encounter (INDEPENDENT_AMBULATORY_CARE_PROVIDER_SITE_OTHER): Payer: Self-pay | Admitting: Primary Care

## 2023-03-09 NOTE — Progress Notes (Signed)
 [facility]  Wayne [name], is a 63 y.o. male  RDW:[id number]  FMW:[id number]  DOB - Feb 12, 1960  Chief Complaint  Patient presents with   Hypertension   Hyperlipidemia       Subjective:   Wayne [name] is a 63 y.o. male here today for a follow up visit for the management of HTN. Patient has No headache, No chest pain, No abdominal pain - No Nausea, No new weakness tingling or numbness, No Cough - shortness of breath.Also, hyperlipidemia- needing labs.  No problems updated.  Comprehensive ROS Pertinent positive and negative noted in HPI   Allergies  Allergen Reactions   Hydralazine  Other (See Comments)    Headache    Past Medical History:  Diagnosis Date   Arthritis    shoulders    Gout    High cholesterol    Hypertension    Prostate cancer (HCC)     Current Outpatient Medications on File Prior to Visit  Medication Sig Dispense Refill   allopurinol  (ZYLOPRIM ) 300 MG tablet TAKE 1 Tablet BY MOUTH ONCE DAILY IN THE MORNING 90 tablet 0   amLODipine  (NORVASC ) 10 MG tablet TAKE 1 TABLET(10 MG) BY MOUTH DAILY 90 tablet 1   celecoxib  (CELEBREX ) 200 MG capsule Take 1 capsule (200 mg total) by mouth 2 (two) times daily. 120 capsule 1   chlorthalidone  (HYGROTON ) 25 MG tablet Take 1 tablet (25 mg total) by mouth daily. 90 tablet 1   lidocaine  (LIDODERM ) 5 % Place 1 patch onto the skin daily. Remove & Discard patch within 12 hours or as directed by MD 30 patch 1   losartan  (COZAAR ) 100 MG tablet TAKE ONE TABLET BY MOUTH EVERY EVENING 90 tablet 1   No current facility-administered medications on file prior to visit.   Health Maintenance  Topic Date Due   COVID-19 Vaccine (3 - Pfizer risk series) 10/17/2019   Flu Shot  04/22/2023*   Zoster (Shingles) Vaccine (1 of 2) 05/27/2023*   Pneumococcal Vaccination (1 of 2 - PCV) 02/27/2024*   Cologuard (Stool DNA test)  10/26/2025   DTaP/Tdap/Td vaccine (2 - Td or Tdap) 05/15/2026   Hepatitis C Screening  Completed    HIV Screening  Completed   HPV Vaccine  Aged Out  *Topic was postponed. The date shown is not the original due date.    Objective:   Vitals:   02/27/23 1000 02/27/23 1001  BP: (!) 159/83 (!) 157/75  Pulse: (!) 58   Resp: 16   SpO2: 100%   Weight: 235 lb 6.4 oz (106.8 kg)   Height: 6' 3 (1.905 m)    BP Readings from Last 3 Encounters:  02/27/23 (!) 157/75  10/11/22 130/72  08/27/22 115/82      Physical Exam General: No apparent distress. Eyes: Extraocular eye movements intact, pupils equal and round. Neck: Supple, trachea midline. Thyroid: No enlargement, mobile without fixation, no tenderness. Cardiovascular: Regular rhythm and rate, no murmur, normal radial pulses. Respiratory: Normal respiratory effort, clear to auscultation. Gastrointestinal: Normal pitch active bowel sounds, nontender abdomen without distention or appreciable hepatomegaly. Musculoskeletal: Normal muscle tone, no tenderness on palpation of tibia, no excessive thoracic kyphosis. Skin: Appropriate warmth, no visible rash. Mental status: Alert, conversant, speech clear, thought logical, appropriate mood and affect, no hallucinations or delusions evident. Hematologic/lymphatic: No cervical adenopathy, no visible ecchymoses.   Assessment & Plan   Wayne [name] was seen today for hypertension and hyperlipidemia.  Diagnoses and all orders for this visit:  Essential hypertension BP goal - <  140/90 Explained that having normal blood pressure is the goal and medications are helping to get to goal and maintain normal blood pressure. DIET: Limit salt intake, read nutrition labels to check salt content, limit fried and high fatty foods  Avoid using multisymptom OTC cold preparations that generally contain sudafed which can rise BP. Consult with pharmacist on best cold relief products to use for persons with HTN EXERCISE Discussed incorporating exercise such as walking - 30 minutes most days of the week and can do in  10 minute intervals    -     CBC with Differential/Platelet -     CMP14+EGFR  Mixed hyperlipidemia  Healthy lifestyle diet of fruits vegetables fish nuts whole grains and low saturated fat . Foods high in cholesterol or liver, fatty meats,cheese, butter avocados, nuts and seeds, chocolate and fried foods. -     Lipid panel  Herpes zoster vaccination declined  Influenza vaccination declined  Pneumococcal vaccination declined  Other orders- osteoarthritis bilateral knees -     ibuprofen  (ADVIL ) 800 MG tablet; Take 1 tablet (800 mg total) by mouth every 8 (eight) hours as needed.     Patient have been counseled extensively about nutrition and exercise. Other issues discussed during this visit include: low cholesterol diet, weight control and daily exercise, foot care, annual eye examinations at Ophthalmology, importance of adherence with medications and regular follow-up. We also discussed long term complications of uncontrolled diabetes and hypertension.   Return in about 3 months (around 05/27/2023) for HTN/hyperlipidemia.  The patient was given clear instructions to go to ER or return to medical center if symptoms don't improve, worsen or new problems develop. The patient verbalized understanding. The patient was told to call to get lab results if they haven't heard anything in the next week.   This note has been created with Education officer, environmental. Any transcriptional errors are unintentional.   Wayne SHAUNNA Bohr, NP 03/09/2023, 8:04 PM

## 2023-03-13 ENCOUNTER — Other Ambulatory Visit (INDEPENDENT_AMBULATORY_CARE_PROVIDER_SITE_OTHER): Payer: Self-pay

## 2023-03-13 ENCOUNTER — Ambulatory Visit (INDEPENDENT_AMBULATORY_CARE_PROVIDER_SITE_OTHER): Payer: Medicaid Other

## 2023-03-13 DIAGNOSIS — E782 Mixed hyperlipidemia: Secondary | ICD-10-CM

## 2023-03-13 DIAGNOSIS — I10 Essential (primary) hypertension: Secondary | ICD-10-CM

## 2023-03-13 DIAGNOSIS — Z760 Encounter for issue of repeat prescription: Secondary | ICD-10-CM

## 2023-03-13 MED ORDER — GEMFIBROZIL 600 MG PO TABS: 600.0000 mg | ORAL_TABLET | Freq: Two times a day (BID) | ORAL | 1 refills | Status: DC

## 2023-03-13 MED ORDER — LOSARTAN POTASSIUM 100 MG PO TABS: ORAL_TABLET | ORAL | 1 refills | Status: DC

## 2023-03-15 ENCOUNTER — Ambulatory Visit (INDEPENDENT_AMBULATORY_CARE_PROVIDER_SITE_OTHER): Payer: Medicaid Other

## 2023-03-15 DIAGNOSIS — C61 Malignant neoplasm of prostate: Secondary | ICD-10-CM | POA: Diagnosis not present

## 2023-03-15 DIAGNOSIS — N5201 Erectile dysfunction due to arterial insufficiency: Secondary | ICD-10-CM | POA: Diagnosis not present

## 2023-03-19 ENCOUNTER — Ambulatory Visit (INDEPENDENT_AMBULATORY_CARE_PROVIDER_SITE_OTHER): Payer: Medicaid Other | Admitting: Primary Care

## 2023-03-19 VITALS — BP 119/83 | HR 65 | Wt 238.0 lb

## 2023-03-19 DIAGNOSIS — I10 Essential (primary) hypertension: Secondary | ICD-10-CM

## 2023-03-29 NOTE — Progress Notes (Signed)
 [facility]   Wayne [name] is a 63 y.o. male presents for hypertension evaluation, Denies shortness of breath, headaches, chest pain or lower extremity edema, sudden onset, vision changes, unilateral weakness, dizziness, paresthesias   Patient reports adherence with medications.  Dietary habits include: trying yo monitor sodium  Exercise habits include:walking Family / Social history: colon cancer mother   Past Medical History:  Diagnosis Date   Arthritis    shoulders    Gout    High cholesterol    Hypertension    Prostate cancer [facility])    Past Surgical History:  Procedure Laterality Date   KNEE ARTHROSCOPY     LUMBAR DISC SURGERY     LYMPHADENECTOMY Bilateral 04/04/2020   Procedure: LYMPHADENECTOMY, PELVIC;  Surgeon: Heloise Purpura, MD;  Location: [facility];  Service: Urology;  Laterality: Bilateral;   ROBOT ASSISTED LAPAROSCOPIC RADICAL PROSTATECTOMY N/A 04/04/2020   Procedure: XI ROBOTIC ASSISTED LAPAROSCOPIC RADICAL PROSTATECTOMY LEVEL 2;  Surgeon: Heloise Purpura, MD;  Location: [facility];  Service: Urology;  Laterality: N/A;   Allergies  Allergen Reactions   Hydralazine Other (See Comments)    Headache   Current Outpatient Medications on File Prior to Visit  Medication Sig Dispense Refill   allopurinol (ZYLOPRIM) 300 MG tablet TAKE 1 Tablet BY MOUTH ONCE DAILY IN THE MORNING 90 tablet 0   amLODipine (NORVASC) 10 MG tablet TAKE 1 TABLET(10 MG) BY MOUTH DAILY 90 tablet 1   celecoxib (CELEBREX) 200 MG capsule Take 1 capsule (200 mg total) by mouth 2 (two) times daily. 120 capsule 1   chlorthalidone (HYGROTON) 25 MG tablet Take 1 tablet (25 mg total) by mouth daily. 90 tablet 1   gemfibrozil (LOPID) 600 MG tablet Take 1 tablet (600 mg total) by mouth 2 (two) times daily before a meal. 180 tablet 1   ibuprofen (ADVIL) 800 MG tablet Take 1 tablet (800 mg total) by mouth every 8 (eight) hours as needed. 90 tablet 0   losartan (COZAAR) 100 MG tablet TAKE ONE TABLET  BY MOUTH EVERY EVENING 90 tablet 1   No current facility-administered medications on file prior to visit.   Social History   Socioeconomic History   Marital status: Married    Spouse name: Mazon   Number of children: 4   Years of education: Not on file   Highest education level: Not on file  Occupational History    Comment: patient does not work full time  Tobacco Use   Smoking status: Every Day    Current packs/day: 0.50    Types: Cigarettes   Smokeless tobacco: Never   Tobacco comments:    Started smoking at the age of 89  Vaping Use   Vaping status: Never Used  Substance and Sexual Activity   Alcohol use: Yes    Alcohol/week: 14.0 standard drinks of alcohol    Types: 14 Cans of beer per week   Drug use: No   Sexual activity: Yes  Other Topics Concern   Not on file  Social History Narrative   Not on file   Social Drivers of Health   Financial Resource Strain: Low Risk  (05/11/2022)   Overall Financial Resource Strain (CARDIA)    Difficulty of Paying Living Expenses: Not hard at all  Food Insecurity: No Food Insecurity (02/19/2023)   Hunger Vital Sign    Worried About Running Out of Food in the Last Year: Never true    Ran Out of Food in the Last Year: Never true  Transportation Needs: No Transportation Needs (01/09/2023)   PRAPARE - Administrator, Civil Service (Medical): No    Lack of Transportation (Non-Medical): No  Physical Activity: Insufficiently Active (07/11/2022)   Exercise Vital Sign    Days of Exercise per Week: 7 days    Minutes of Exercise per Session: 10 min  Stress: No Stress Concern Present (07/11/2022)   [employer] of Occupational Health - Occupational Stress Questionnaire    Feeling of Stress : Not at all  Social Connections: Moderately Integrated (01/09/2023)   Social Connection and Isolation Panel [NHANES]    Frequency of Communication with Friends and Family: More than three times a week    Frequency of Social Gatherings  with Friends and Family: More than three times a week    Attends Religious Services: 1 to 4 times per year    Active Member of [employer] or Organizations: No    Attends Banker Meetings: Never    Marital Status: Married  Catering manager Violence: Not At Risk (02/14/2022)   Humiliation, Afraid, Rape, and Kick questionnaire    Fear of Current or Ex-Partner: No    Emotionally Abused: No    Physically Abused: No    Sexually Abused: No   Family History  Problem Relation Age of Onset   Colon cancer Mother    Pancreatic cancer Maternal Uncle    Colon polyps Neg Hx    Esophageal cancer Neg Hx    Rectal cancer Neg Hx    Health Maintenance  Topic Date Due   COVID-19 Vaccine (3 - Pfizer risk series) 10/17/2019   INFLUENZA VACCINE  04/22/2023 (Originally 08/23/2022)   Zoster Vaccines- Shingrix (1 of 2) 05/27/2023 (Originally 10/30/1979)   Pneumococcal Vaccine 52-51 Years old (1 of 2 - PCV) 02/27/2024 (Originally 10/30/1966)   Fecal DNA (Cologuard)  10/26/2025   DTaP/Tdap/Td (2 - Td or Tdap) 05/15/2026   Hepatitis C Screening  Completed   HIV Screening  Completed   HPV VACCINES  Aged Out     OBJECTIVE:  Vitals:   03/19/23 1142  BP: 119/83  Pulse: 65  SpO2: 100%  Weight: 238 lb (108 kg)    Physical Exam Vitals reviewed.  Constitutional:      Appearance: He is obese.  HENT:     Head: Normocephalic.     Right Ear: External ear normal.     Left Ear: External ear normal.     Nose: Nose normal.  Eyes:     Extraocular Movements: Extraocular movements intact.     Pupils: Pupils are equal, round, and reactive to light.  Cardiovascular:     Rate and Rhythm: Normal rate and regular rhythm.  Pulmonary:     Effort: Pulmonary effort is normal.     Breath sounds: Normal breath sounds.  Abdominal:     General: Bowel sounds are normal. There is distension.     Palpations: Abdomen is soft.  Musculoskeletal:        General: Normal range of motion.  Skin:    General: Skin is  warm and dry.  Neurological:     Mental Status: He is oriented to person, place, and time.  Psychiatric:        Mood and Affect: Mood normal.        Behavior: Behavior normal.        Thought Content: Thought content normal.        Judgment: Judgment normal.      ROS Comprehensive ROS Pertinent  positive and negative noted in HPI   Last 3 Office BP readings: BP Readings from Last 3 Encounters:  03/19/23 119/83  02/27/23 (!) 157/75  10/11/22 130/72    BMET    Component Value Date/Time   NA 142 02/27/2023 0952   K 3.9 02/27/2023 0952   CL 99 02/27/2023 0952   CO2 24 02/27/2023 0952   GLUCOSE 101 (H) 02/27/2023 0952   GLUCOSE 105 (H) 03/30/2020 1041   BUN 12 02/27/2023 0952   CREATININE 0.91 02/27/2023 0952   CALCIUM 9.5 02/27/2023 0952   GFRNONAA >60 03/30/2020 1041   GFRAA 82 06/10/2019 1045    Renal function: CrCl cannot be calculated (Patient's most recent lab result is older than the maximum 21 days allowed.).  Clinical ASCVD: Yes  The 10-year ASCVD risk score (Arnett DK, et al., 2019) is: 21.2%   Values used to calculate the score:     Age: 63 years     Sex: Male     Is Non-Hispanic African American: Yes     Diabetic: No     Tobacco smoker: Yes     Systolic Blood Pressure: 119 mmHg     Is BP treated: Yes     HDL Cholesterol: 39 mg/dL     Total Cholesterol: 163 mg/dL  ASCVD risk factors include- [place]   ASSESSMENT & PLAN: Diagnoses and all orders for this visit:  Essential hypertension Bp f/u on medications -Counseled on lifestyle modifications for blood pressure control including reduced dietary sodium, increased exercise, weight reduction and adequate sleep. Also, educated patient about the risk for cardiovascular events, stroke and heart attack. Also counseled patient about the importance of medication adherence. If you participate in smoking, it is important to stop using tobacco as this will increase the risks associated with uncontrolled blood  pressure.  Goal BP:  For patients younger than 60: Goal BP < 130/80. For patients 60 and older: Goal BP < 140/90. For patients with diabetes: Goal BP < 130/80. Your most recent BP: 119/83  Minimize salt intake. Minimize alcohol intake    This note has been created with Education officer, environmental. Any transcriptional errors are unintentional.   [name], NP 03/29/2023, 9:19 AM

## 2023-04-08 ENCOUNTER — Other Ambulatory Visit (INDEPENDENT_AMBULATORY_CARE_PROVIDER_SITE_OTHER): Payer: Self-pay | Admitting: Primary Care

## 2023-04-08 DIAGNOSIS — I10 Essential (primary) hypertension: Secondary | ICD-10-CM

## 2023-04-08 DIAGNOSIS — Z760 Encounter for issue of repeat prescription: Secondary | ICD-10-CM

## 2023-04-19 ENCOUNTER — Ambulatory Visit: Payer: Self-pay

## 2023-04-19 ENCOUNTER — Ambulatory Visit: Payer: Self-pay | Admitting: Obstetrics and Gynecology

## 2023-04-19 NOTE — Patient Outreach (Signed)
 Care Coordination   Follow Up Visit Note   04/19/2023 Name: Wayne [name] MRN: [id number] DOB: January 27, 1960  Wayne [name] is a 63 y.o. year old male who sees Wayne Sessions, NP for primary care. I spoke with  Wayne [name] by phone today.  What matters to the patients health and wellness today?  I had a conversation with Wayne [name], who reported that he is currently managing his blood pressure effectively. He also mentioned experiencing osteoarthritis in both knees, and his orthopedist has recommended that he consider knee replacement surgery. Wayne [name] indicated that there are no additional health concerns that he wishes to discuss at this time.  During our discussion, we reviewed his medical chart, including his allergies, medications, and health history. I provided information on potential strategies for pain management for his knees, such as utilizing Biofreeze, heat and cold therapy, as well as incorporating chair exercises to improve mobility in his knees. He noted that he is already implementing these approaches.  I advised him that should he require further assistance in the future, he should notify his physician. The physician will subsequently reach out to me or the nurse case manager to facilitate support.    Goals Addressed             This Visit's Progress    COMPLETED: Patient Stated-Care Coordination -no follow up required        Care Coordination Interventions: Active listening / Reflection utilized  Emotional Support Provided Problem Solving /Task Center strategies reviewed Discussed/.Educated Care Coordination Program 2.   Discussed/.Educated Annual Wellness Visit 3.   Discussed/.Educated Social Determinates of Health 4.   Please inform PCP if services needed in the future         SDOH assessments and interventions completed:  Yes  SDOH Interventions Today    Flowsheet Row Most Recent Value  SDOH Interventions   Food Insecurity Interventions  Intervention Not Indicated  Housing Interventions Intervention Not Indicated  Transportation Interventions Intervention Not Indicated  Utilities Interventions Intervention Not Indicated        Care Coordination Interventions:  Yes, provided   Interventions Today    Flowsheet Row Most Recent Value  Chronic Disease   Chronic disease during today's visit Other, Hypertension (HTN)  [HTN Bilateral knee osteoarthritis]  General Interventions   General Interventions Discussed/Reviewed General Interventions Discussed, Doctor Visits  Exercise Interventions   Exercise Discussed/Reviewed Exercise Discussed  Fonnie Birkenhead exercises,]  Education Interventions   Education Provided Provided Education  Provided Verbal Education On Other  [biofreeze rub, using heat or cold]  Pharmacy Interventions   Pharmacy Dicussed/Reviewed Pharmacy Topics Discussed, Pharmacy Topics Reviewed  Safety Interventions   Safety Discussed/Reviewed Safety Discussed        Follow up plan: No further intervention required.   Encounter Outcome:  Patient Visit Completed   Juanell Fairly RN, BSN, [facility] [place]  [facility], [facility]  Care Coordinator Phone: [phone]

## 2023-04-19 NOTE — Patient Instructions (Signed)
 Visit Information  Thank you for allowing me to share the  care coordination services that are available to you as part of your health plan and services through your primary care provider. Please reach out to your Office if the care coordination team may be of assistance to you in the future.   Juanell Fairly RN, BSN, [facility] Care Management Coordinator [facility], Population Health  Phone: [phone]

## 2023-05-16 ENCOUNTER — Other Ambulatory Visit (INDEPENDENT_AMBULATORY_CARE_PROVIDER_SITE_OTHER): Payer: Self-pay | Admitting: Primary Care

## 2023-05-16 DIAGNOSIS — Z8739 Personal history of other diseases of the musculoskeletal system and connective tissue: Secondary | ICD-10-CM

## 2023-05-16 DIAGNOSIS — Z760 Encounter for issue of repeat prescription: Secondary | ICD-10-CM

## 2023-05-16 NOTE — Telephone Encounter (Signed)
 Will forward to provider

## 2023-06-10 ENCOUNTER — Other Ambulatory Visit (INDEPENDENT_AMBULATORY_CARE_PROVIDER_SITE_OTHER): Payer: Self-pay | Admitting: Primary Care

## 2023-06-10 DIAGNOSIS — I10 Essential (primary) hypertension: Secondary | ICD-10-CM

## 2023-06-10 DIAGNOSIS — Z760 Encounter for issue of repeat prescription: Secondary | ICD-10-CM

## 2023-06-12 NOTE — Telephone Encounter (Signed)
 Requested Prescriptions  Pending Prescriptions Disp Refills   amLODipine  (NORVASC ) 10 MG tablet [Pharmacy Med Name: amlodipine  10 mg tablet] 90 tablet 1    Sig: TAKE 1 Tablet BY MOUTH ONCE DAILY     Cardiovascular: Calcium  Channel Blockers 2 Passed - 06/12/2023 11:09 AM      Passed - Last BP in normal range    BP Readings from Last 1 Encounters:  03/19/23 119/83         Passed - Last Heart Rate in normal range    Pulse Readings from Last 1 Encounters:  03/19/23 65         Passed - Valid encounter within last 6 months    Recent Outpatient Visits           3 months ago Essential hypertension   [place] [facility] Marius Siemens, NP   8 months ago Colon cancer screening   [place] [facility] Marius Siemens, NP   9 months ago Essential hypertension   [place] [facility] Marius Siemens, NP   1 year ago Mixed hyperlipidemia   [place] [facility] Marius Siemens, NP   1 year ago Medication refill    [facility] Marius Siemens, NP

## 2023-06-13 ENCOUNTER — Encounter (INDEPENDENT_AMBULATORY_CARE_PROVIDER_SITE_OTHER): Payer: Self-pay | Admitting: Primary Care

## 2023-06-13 NOTE — Telephone Encounter (Signed)
 Will forward to provider

## 2023-07-05 ENCOUNTER — Other Ambulatory Visit (INDEPENDENT_AMBULATORY_CARE_PROVIDER_SITE_OTHER): Payer: Self-pay | Admitting: Primary Care

## 2023-07-16 ENCOUNTER — Other Ambulatory Visit (INDEPENDENT_AMBULATORY_CARE_PROVIDER_SITE_OTHER): Payer: Self-pay | Admitting: Primary Care

## 2023-07-16 DIAGNOSIS — Z8739 Personal history of other diseases of the musculoskeletal system and connective tissue: Secondary | ICD-10-CM

## 2023-07-16 DIAGNOSIS — Z760 Encounter for issue of repeat prescription: Secondary | ICD-10-CM

## 2023-07-16 NOTE — Telephone Encounter (Signed)
 Will forward to provider

## 2023-09-24 ENCOUNTER — Telehealth: Payer: Self-pay

## 2023-09-24 ENCOUNTER — Other Ambulatory Visit (INDEPENDENT_AMBULATORY_CARE_PROVIDER_SITE_OTHER): Payer: Self-pay

## 2023-09-24 ENCOUNTER — Ambulatory Visit (INDEPENDENT_AMBULATORY_CARE_PROVIDER_SITE_OTHER): Admitting: Orthopaedic Surgery

## 2023-09-24 VITALS — Ht 75.5 in | Wt 238.8 lb

## 2023-09-24 DIAGNOSIS — M1712 Unilateral primary osteoarthritis, left knee: Secondary | ICD-10-CM

## 2023-09-24 DIAGNOSIS — M1711 Unilateral primary osteoarthritis, right knee: Secondary | ICD-10-CM

## 2023-09-24 NOTE — Telephone Encounter (Signed)
 Patient was given a clearance form for him to take to his PCP. We will need form in order to schedule surgery (L TKA). Patient aware.

## 2023-09-24 NOTE — Progress Notes (Signed)
 Office Visit Note   Patient: Wayne [name]           Date of Birth: [birth year]           MRN: [id number] Visit Date: 09/24/2023              Requested by: Wayne Rosaline SQUIBB, NP [address] [place],  [place] [address] PCP: Wayne Rosaline SQUIBB, NP   Assessment & Plan: Visit Diagnoses:  1. Primary osteoarthritis of left knee   2. Primary osteoarthritis of right knee     Plan: History of Present Illness Wayne [name] is a 63 year old male with bilateral knee osteoarthritis who presents for follow-up regarding worsening knee pain. He is accompanied by his cousin, Wayne [name].  He experiences worsening pain in both knees due to bilateral knee osteoarthritis. A year ago, 50 cc's of fluid was drained from his right knee. He is considering surgery but is unsure which knee to prioritize since both are symptomatic. His right knee currently feels okay, and he does not feel the need for an injection, although he experiences increased pain by the end of the day.  He smokes eight to nine cigarettes daily and drinks alcohol, with increased consumption on weekends. He has reduced beer intake due to gout flare-ups.  Exam of the left knee shows pain and crepitus throughout range of motion.  Small joint effusion.  Assessment and Plan Bilateral knee osteoarthritis Chronic bilateral knee osteoarthritis, left knee more severe. - Detailed surgical plan discussed.  Impression is severe left knee degenerative joint disease secondary to Osteoarthritis.  Patient has attempted conservative treatment for at least 6 consecutive weeks within the past 12 weeks, including but not limited to physical therapy, home exercise program, NSAIDs, activity modification, and/or corticosteroid injections. Despite these efforts, symptoms have not improved or have worsened. Conservative measures have been deemed unsuccessful at this time. After a detailed discussion covering diagnosis and treatment  options--including the risks, benefits, alternatives, and potential complications of surgical and nonsurgical management--the patient elected to proceed with surgery  Anticoagulants: No antithrombotic Postop anticoagulation: Eliquis Diabetic: No  Nickel allergy: No Prior DVT/PE: No Tobacco use: Yes 8-9 cigs a day Clearances needed for surgery: PCP Anticipated discharge dispo: Home   Follow-Up Instructions: No follow-ups on file.   Orders:  Orders Placed This Encounter  Procedures   XR KNEE 3 VIEW LEFT   XR KNEE 3 VIEW RIGHT   No orders of the defined types were placed in this encounter.   Subjective: Chief Complaint  Patient presents with   Left Knee - Pain   Right Knee - Pain    HPI  Review of Systems  Constitutional: Negative.   HENT: Negative.    Eyes: Negative.   Respiratory: Negative.    Cardiovascular: Negative.   Gastrointestinal: Negative.   Endocrine: Negative.   Genitourinary: Negative.   Skin: Negative.   Allergic/Immunologic: Negative.   Neurological: Negative.   Hematological: Negative.   Psychiatric/Behavioral: Negative.    All other systems reviewed and are negative.    Objective: Vital Signs: Ht 6' 3.5 (1.918 m)   Wt 238 lb 12.8 oz (108.3 kg)   BMI 29.45 kg/m   Physical Exam Vitals and nursing note reviewed.  Constitutional:      Appearance: He is well-developed.  HENT:     Head: Normocephalic and atraumatic.  Eyes:     Pupils: Pupils are equal, round, and reactive to light.  Pulmonary:  Effort: Pulmonary effort is normal.  Abdominal:     Palpations: Abdomen is soft.  Musculoskeletal:        General: Normal range of motion.     Cervical back: Neck supple.  Skin:    General: Skin is warm.  Neurological:     Mental Status: He is alert and oriented to person, place, and time.  Psychiatric:        Behavior: Behavior normal.        Thought Content: Thought content normal.        Judgment: Judgment normal.     Ortho  Exam  Specialty Comments:  No specialty comments available.  Imaging: XR KNEE 3 VIEW LEFT Result Date: 09/24/2023 X-rays of the left knee show advanced tricompartmental osteoarthritis.  Bone-on-bone joint space narrowing.  Kellgren-Lawrence stage IV  XR KNEE 3 VIEW RIGHT Result Date: 09/24/2023 X-rays demonstrate severe tricompartmental osteoarthritis.  Bone-on-bone joint space narrowing.  Kellgren-Lawrence stage IV    PMFS History: Patient Active Problem List   Diagnosis Date Noted   Primary osteoarthritis of left knee 08/22/2021   Primary osteoarthritis of right knee 08/22/2021   Prostate cancer (HCC) 04/04/2020   Malignant neoplasm of prostate (HCC) 01/17/2020   Acute gout due to renal impairment involving hand 08/12/2017   Essential hypertension 08/12/2017   Unilateral primary osteoarthritis, left knee 05/25/2016   Unilateral primary osteoarthritis, right knee 05/25/2016   Past Medical History:  Diagnosis Date   Arthritis    shoulders    Gout    High cholesterol    Hypertension    Prostate cancer (HCC)     Family History  Problem Relation Age of Onset   Colon cancer Mother    Pancreatic cancer Maternal Uncle    Colon polyps Neg Hx    Esophageal cancer Neg Hx    Rectal cancer Neg Hx     Past Surgical History:  Procedure Laterality Date   KNEE ARTHROSCOPY     LUMBAR DISC SURGERY     LYMPHADENECTOMY Bilateral 04/04/2020   Procedure: LYMPHADENECTOMY, PELVIC;  Surgeon: Wayne Glance, MD;  Location: [facility];  Service: Urology;  Laterality: Bilateral;   ROBOT ASSISTED LAPAROSCOPIC RADICAL PROSTATECTOMY N/A 04/04/2020   Procedure: XI ROBOTIC ASSISTED LAPAROSCOPIC RADICAL PROSTATECTOMY LEVEL 2;  Surgeon: Wayne Glance, MD;  Location: [facility];  Service: Urology;  Laterality: N/A;   Social History   Occupational History    Comment: patient does not work full time  Tobacco Use   Smoking status: Every Day    Current packs/day: 0.50    Types: Cigarettes   Smokeless  tobacco: Never   Tobacco comments:    Started smoking at the age of 40  Vaping Use   Vaping status: Never Used  Substance and Sexual Activity   Alcohol use: Yes    Alcohol/week: 14.0 standard drinks of alcohol    Types: 14 Cans of beer per week   Drug use: No   Sexual activity: Yes

## 2023-09-25 ENCOUNTER — Telehealth (INDEPENDENT_AMBULATORY_CARE_PROVIDER_SITE_OTHER): Payer: Self-pay | Admitting: Primary Care

## 2023-09-25 NOTE — Telephone Encounter (Signed)
 Pt came in stating that he needs to drop off paperwork for provider to fill out and faxed back to number [phone]. Pt stated that he may also need to schedule an appt and if needed to call and schedule to have paperwork filled out.

## 2023-10-01 ENCOUNTER — Telehealth (INDEPENDENT_AMBULATORY_CARE_PROVIDER_SITE_OTHER): Payer: Self-pay | Admitting: Primary Care

## 2023-10-01 NOTE — Telephone Encounter (Signed)
 Called pt to confirm appt. Pt will be present.

## 2023-10-02 ENCOUNTER — Ambulatory Visit (INDEPENDENT_AMBULATORY_CARE_PROVIDER_SITE_OTHER): Admitting: Primary Care

## 2023-10-02 ENCOUNTER — Encounter (INDEPENDENT_AMBULATORY_CARE_PROVIDER_SITE_OTHER): Payer: Self-pay | Admitting: Primary Care

## 2023-10-02 VITALS — BP 153/92 | HR 74 | Temp 97.9°F | Resp 12 | Ht 75.0 in | Wt 241.0 lb

## 2023-10-02 DIAGNOSIS — M2559 Pain in other specified joint: Secondary | ICD-10-CM

## 2023-10-02 DIAGNOSIS — E782 Mixed hyperlipidemia: Secondary | ICD-10-CM | POA: Diagnosis not present

## 2023-10-02 DIAGNOSIS — Z01818 Encounter for other preprocedural examination: Secondary | ICD-10-CM

## 2023-10-02 DIAGNOSIS — R9431 Abnormal electrocardiogram [ECG] [EKG]: Secondary | ICD-10-CM

## 2023-10-02 NOTE — Progress Notes (Signed)
 [facility]  Everett Ehrler, is a 63 y.o. male  RDW:[id number]  FMW:[id number]  DOB - 1960-12-07  Chief Complaint  Patient presents with   Follow-up       Subjective:   Wayne [name] is a 63 y.o. male here today for medical clearance.  Patient is planning on having a knee replacement by orthopedics Dr. Jerri.  Blood pressure is elevated today-he recalls it must be the barbecue he ate last night.  No changes in medication will return in 2 weeks for blood pressure recheck without out barbecue, salty diet, chips, pickles or fried foods.  He admits to eating hot pickles daily- stop or reduce to 1-2 times a month. Patient has No headache, No chest pain, No abdominal pain - No Nausea, No new weakness tingling or numbness, No Cough - shortness of breath HPI  No problems updated.  Comprehensive ROS Pertinent positive and negative noted in HPI   Allergies  Allergen Reactions   Hydralazine  Other (See Comments)    Headache    Past Medical History:  Diagnosis Date   Arthritis    shoulders    Gout    High cholesterol    Hypertension    Prostate cancer (HCC)     Current Outpatient Medications on File Prior to Visit  Medication Sig Dispense Refill   allopurinol  (ZYLOPRIM ) 300 MG tablet TAKE 1 Tablet BY MOUTH ONCE DAILY IN THE MORNING 90 tablet 0   amLODipine  (NORVASC ) 10 MG tablet TAKE 1 Tablet BY MOUTH ONCE DAILY 90 tablet 1   celecoxib  (CELEBREX ) 200 MG capsule Take 1 capsule (200 mg total) by mouth 2 (two) times daily. 120 capsule 1   chlorthalidone  (HYGROTON ) 25 MG tablet TAKE 1 Tablet BY MOUTH ONCE DAILY 90 tablet 1   gemfibrozil  (LOPID ) 600 MG tablet Take 1 tablet (600 mg total) by mouth 2 (two) times daily before a meal. 180 tablet 1   ibuprofen  (ADVIL ) 800 MG tablet TAKE ONE TABLET BY MOUTH EVERY 8 HOURS as needed for pain 20 tablet 0   losartan  (COZAAR ) 100 MG tablet TAKE ONE TABLET BY MOUTH EVERY EVENING 90 tablet 1   No current facility-administered  medications on file prior to visit.   Health Maintenance  Topic Date Due   COVID-19 Vaccine (3 - Pfizer risk series) 10/18/2023*   Zoster (Shingles) Vaccine (1 of 2) 01/01/2024*   Flu Shot  04/21/2024*   Pneumococcal Vaccine for age over 61 (1 of 2 - PCV) 10/01/2024*   Cologuard (Stool DNA test)  10/26/2025   DTaP/Tdap/Td vaccine (2 - Td or Tdap) 05/15/2026   Hepatitis C Screening  Completed   HIV Screening  Completed   Hepatitis B Vaccine  Aged Out   HPV Vaccine  Aged Out   Meningitis B Vaccine  Aged Out  *Topic was postponed. The date shown is not the original due date.    Objective:   Vitals:   10/02/23 1408 10/02/23 1456  BP: (!) 152/92 (!) 153/92  Pulse: 74   Resp: 12   Temp: 97.9 F (36.6 C)   TempSrc: Oral   SpO2: 99%   Weight: 241 lb (109.3 kg)   Height: 6' 3 (1.905 m)    BP Readings from Last 3 Encounters:  10/02/23 (!) 153/92  03/19/23 119/83  02/27/23 (!) 157/75      Physical Exam Vitals reviewed.  Constitutional:      Appearance: He is obese.  HENT:     Head: Normocephalic.  Right Ear: Tympanic membrane, ear canal and external ear normal.     Left Ear: Tympanic membrane, ear canal and external ear normal.     Nose: Nose normal.  Eyes:     Extraocular Movements: Extraocular movements intact.     Pupils: Pupils are equal, round, and reactive to light.  Cardiovascular:     Rate and Rhythm: Normal rate and regular rhythm.  Pulmonary:     Effort: Pulmonary effort is normal.     Breath sounds: Normal breath sounds.  Abdominal:     General: Bowel sounds are normal. There is distension.     Palpations: Abdomen is soft.  Musculoskeletal:     Comments: Decrease ROM due to bone on bone knees  Skin:    General: Skin is warm and dry.  Neurological:     Mental Status: He is oriented to person, place, and time.  Psychiatric:        Mood and Affect: Mood normal.        Behavior: Behavior normal.        Thought Content: Thought content normal.         Judgment: Judgment normal.     Assessment & Plan   Laquinn was seen today for follow-up.  Diagnoses and all orders for this visit:  Pre-operative clearance -     CBC with Differential -     CMP14+EGFR -     Lipid Panel -     Protime-INR -     EKG 12-Lead  Pain in other joint -     Uric Acid  Nonspecific abnormal electrocardiogram (ECG) (EKG) -     Ambulatory referral to Cardiology     Patient have been counseled extensively about nutrition and exercise. Other issues discussed during this visit include: low cholesterol diet, weight control and daily exercise, foot care, annual eye examinations at Ophthalmology, importance of adherence with medications and regular follow-up. We also discussed long term complications of uncontrolled diabetes and hypertension.   Return for re-check blood pressure.  The patient was given clear instructions to go to ER or return to medical center if symptoms don't improve, worsen or new problems develop. The patient verbalized understanding. The patient was told to call to get lab results if they haven't heard anything in the next week.   This note has been created with Education officer, environmental. Any transcriptional errors are unintentional.   Wayne SHAUNNA Bohr, NP 10/02/2023, 3:35 PM

## 2023-10-03 ENCOUNTER — Other Ambulatory Visit (INDEPENDENT_AMBULATORY_CARE_PROVIDER_SITE_OTHER): Payer: Self-pay | Admitting: Primary Care

## 2023-10-03 DIAGNOSIS — I10 Essential (primary) hypertension: Secondary | ICD-10-CM

## 2023-10-03 DIAGNOSIS — Z760 Encounter for issue of repeat prescription: Secondary | ICD-10-CM

## 2023-10-03 LAB — PROTIME-INR
INR: 1.2 (ref 0.9–1.2)
Prothrombin Time: 12.7 s — ABNORMAL HIGH (ref 9.1–12.0)

## 2023-10-03 LAB — CMP14+EGFR
ALT: 19 IU/L (ref 0–44)
AST: 18 IU/L (ref 0–40)
Albumin: 4.5 g/dL (ref 3.9–4.9)
Alkaline Phosphatase: 118 IU/L (ref 44–121)
BUN/Creatinine Ratio: 14 (ref 10–24)
BUN: 11 mg/dL (ref 8–27)
Bilirubin Total: 0.2 mg/dL (ref 0.0–1.2)
CO2: 24 mmol/L (ref 20–29)
Calcium: 10 mg/dL (ref 8.6–10.2)
Chloride: 98 mmol/L (ref 96–106)
Creatinine, Ser: 0.81 mg/dL (ref 0.76–1.27)
Globulin, Total: 3.4 g/dL (ref 1.5–4.5)
Glucose: 67 mg/dL — ABNORMAL LOW (ref 70–99)
Potassium: 3.6 mmol/L (ref 3.5–5.2)
Sodium: 139 mmol/L (ref 134–144)
Total Protein: 7.9 g/dL (ref 6.0–8.5)
eGFR: 100 mL/min/1.73 (ref >59)

## 2023-10-03 LAB — CBC WITH DIFFERENTIAL/PLATELET
Basophils Absolute: 0 x10E3/uL (ref 0.0–0.2)
Basos: 1 %
EOS (ABSOLUTE): 0.1 x10E3/uL (ref 0.0–0.4)
Eos: 2 %
Hematocrit: 44.8 % (ref 37.5–51.0)
Hemoglobin: 14.7 g/dL (ref 13.0–17.7)
Immature Grans (Abs): 0 x10E3/uL (ref 0.0–0.1)
Immature Granulocytes: 0 %
Lymphocytes Absolute: 2.4 x10E3/uL (ref 0.7–3.1)
Lymphs: 40 %
MCH: 29.6 pg (ref 26.6–33.0)
MCHC: 32.8 g/dL (ref 31.5–35.7)
MCV: 90 fL (ref 79–97)
Monocytes Absolute: 0.5 x10E3/uL (ref 0.1–0.9)
Monocytes: 8 %
Neutrophils Absolute: 3 x10E3/uL (ref 1.4–7.0)
Neutrophils: 49 %
Platelets: 372 x10E3/uL (ref 150–450)
RBC: 4.97 x10E6/uL (ref 4.14–5.80)
RDW: 14.8 % (ref 11.6–15.4)
WBC: 6 x10E3/uL (ref 3.4–10.8)

## 2023-10-03 LAB — LIPID PANEL
Chol/HDL Ratio: 6.2 ratio — ABNORMAL HIGH (ref 0.0–5.0)
Cholesterol, Total: 217 mg/dL — ABNORMAL HIGH (ref 100–199)
HDL: 35 mg/dL — ABNORMAL LOW (ref >39)
LDL Chol Calc (NIH): 124 mg/dL — ABNORMAL HIGH (ref 0–99)
Triglycerides: 329 mg/dL — ABNORMAL HIGH (ref 0–149)
VLDL Cholesterol Cal: 58 mg/dL — ABNORMAL HIGH (ref 5–40)

## 2023-10-03 LAB — URIC ACID: Uric Acid: 4 mg/dL (ref 3.8–8.4)

## 2023-10-04 NOTE — Telephone Encounter (Signed)
 Requested Prescriptions  Pending Prescriptions Disp Refills   losartan  (COZAAR ) 100 MG tablet [Pharmacy Med Name: losartan  100 mg tablet] 90 tablet 0    Sig: TAKE ONE TABLET BY MOUTH every evening     Cardiovascular:  Angiotensin Receptor Blockers Failed - 10/04/2023 10:24 AM      Failed - Last BP in normal range    BP Readings from Last 1 Encounters:  10/02/23 (!) 153/92         Passed - Cr in normal range and within 180 days    Creatinine, Ser  Date Value Ref Range Status  10/02/2023 0.81 0.76 - 1.27 mg/dL Final         Passed - K in normal range and within 180 days    Potassium  Date Value Ref Range Status  10/02/2023 3.6 3.5 - 5.2 mmol/L Final         Passed - Patient is not pregnant      Passed - Valid encounter within last 6 months    Recent Outpatient Visits           2 days ago Pre-operative clearance   [place] [facility] Celestia Rosaline SQUIBB, NP   7 months ago Essential hypertension   [place] [facility] Celestia Rosaline SQUIBB, NP   11 months ago Colon cancer screening   [place] [facility] Celestia Rosaline SQUIBB, NP   1 year ago Essential hypertension   [place] [facility] Celestia Rosaline SQUIBB, NP   1 year ago Mixed hyperlipidemia   [place] [facility] Celestia Rosaline SQUIBB, NP               chlorthalidone  (HYGROTON ) 25 MG tablet [Pharmacy Med Name: chlorthalidone  25 mg tablet] 90 tablet 0    Sig: TAKE 1 Tablet BY MOUTH ONCE DAILY     Cardiovascular: Diuretics - Thiazide Failed - 10/04/2023 10:24 AM      Failed - Last BP in normal range    BP Readings from Last 1 Encounters:  10/02/23 (!) 153/92         Passed - Cr in normal range and within 180 days    Creatinine, Ser  Date Value Ref Range Status  10/02/2023 0.81 0.76 - 1.27 mg/dL Final         Passed - K in normal range and within 180 days    Potassium  Date Value Ref Range Status  10/02/2023 3.6 3.5 -  5.2 mmol/L Final         Passed - Na in normal range and within 180 days    Sodium  Date Value Ref Range Status  10/02/2023 139 134 - 144 mmol/L Final         Passed - Valid encounter within last 6 months    Recent Outpatient Visits           2 days ago Pre-operative clearance   [place] [facility] Celestia Rosaline SQUIBB, NP   7 months ago Essential hypertension   [place] [facility] Celestia Rosaline SQUIBB, NP   11 months ago Colon cancer screening   [place] [facility] Celestia Rosaline SQUIBB, NP   1 year ago Essential hypertension    [facility] Celestia Rosaline SQUIBB, NP   1 year ago Mixed hyperlipidemia    [facility] Celestia Rosaline SQUIBB, NP

## 2023-10-08 ENCOUNTER — Ambulatory Visit (INDEPENDENT_AMBULATORY_CARE_PROVIDER_SITE_OTHER): Payer: Self-pay | Admitting: Primary Care

## 2023-10-08 DIAGNOSIS — E782 Mixed hyperlipidemia: Secondary | ICD-10-CM

## 2023-10-08 MED ORDER — ROSUVASTATIN CALCIUM 40 MG PO TABS: 40.0000 mg | ORAL_TABLET | Freq: Every day | ORAL | 3 refills | Status: AC

## 2023-10-11 ENCOUNTER — Telehealth (INDEPENDENT_AMBULATORY_CARE_PROVIDER_SITE_OTHER): Payer: Self-pay | Admitting: Primary Care

## 2023-10-11 NOTE — Telephone Encounter (Signed)
 Contacted pt resch appt!

## 2023-10-13 ENCOUNTER — Other Ambulatory Visit (INDEPENDENT_AMBULATORY_CARE_PROVIDER_SITE_OTHER): Payer: Self-pay | Admitting: Primary Care

## 2023-10-13 DIAGNOSIS — M1A9XX1 Chronic gout, unspecified, with tophus (tophi): Secondary | ICD-10-CM

## 2023-10-13 DIAGNOSIS — M1711 Unilateral primary osteoarthritis, right knee: Secondary | ICD-10-CM

## 2023-10-13 MED ORDER — CELECOXIB 200 MG PO CAPS: 200.0000 mg | ORAL_CAPSULE | Freq: Two times a day (BID) | ORAL | 1 refills | Status: DC

## 2023-10-15 ENCOUNTER — Telehealth (INDEPENDENT_AMBULATORY_CARE_PROVIDER_SITE_OTHER): Payer: Self-pay

## 2023-10-15 NOTE — Telephone Encounter (Signed)
 Are you able to assist with this concern    Copied from CRM #[id number]. Topic: Clinical - Prescription Issue >> Oct 14, 2023  3:08 PM Rosaria E wrote: Reason for CRM: Johnnie from [facility] called to report a drug interaction between gemfibrozil  (LOPID ) 600 MG tablet and  rosuvastatin  (CRESTOR ) 40 MG tablet. The pharmacy wants to know how his PCP wants to address this?  Please advise

## 2023-10-15 NOTE — Telephone Encounter (Signed)
 Hold gemfibrozil  until PCP returns to address.

## 2023-10-15 NOTE — Telephone Encounter (Signed)
 Pharmacy called back to check on response of this request. Please advise.

## 2023-10-16 ENCOUNTER — Ambulatory Visit (INDEPENDENT_AMBULATORY_CARE_PROVIDER_SITE_OTHER)

## 2023-10-18 DIAGNOSIS — C61 Malignant neoplasm of prostate: Secondary | ICD-10-CM | POA: Diagnosis not present

## 2023-10-18 NOTE — Telephone Encounter (Signed)
 Late Entry: My pharmacy was called and informed to hold the LOPID  until provider is back in office. Pharmacy states that they will inform the patient.

## 2023-10-25 DIAGNOSIS — N5201 Erectile dysfunction due to arterial insufficiency: Secondary | ICD-10-CM | POA: Diagnosis not present

## 2023-10-25 DIAGNOSIS — C61 Malignant neoplasm of prostate: Secondary | ICD-10-CM | POA: Diagnosis not present

## 2023-11-04 ENCOUNTER — Ambulatory Visit (INDEPENDENT_AMBULATORY_CARE_PROVIDER_SITE_OTHER)

## 2023-11-06 ENCOUNTER — Other Ambulatory Visit (INDEPENDENT_AMBULATORY_CARE_PROVIDER_SITE_OTHER): Payer: Self-pay | Admitting: Primary Care

## 2023-11-06 DIAGNOSIS — Z8739 Personal history of other diseases of the musculoskeletal system and connective tissue: Secondary | ICD-10-CM

## 2023-11-06 DIAGNOSIS — Z760 Encounter for issue of repeat prescription: Secondary | ICD-10-CM

## 2023-11-07 ENCOUNTER — Ambulatory Visit (INDEPENDENT_AMBULATORY_CARE_PROVIDER_SITE_OTHER)

## 2023-11-07 VITALS — BP 120/75

## 2023-11-07 DIAGNOSIS — Z0130 Encounter for examination of blood pressure without abnormal findings: Secondary | ICD-10-CM | POA: Diagnosis not present

## 2023-11-07 NOTE — Progress Notes (Signed)
 Wayne [name]

## 2023-11-07 NOTE — Progress Notes (Signed)
   Blood Pressure Recheck Visit  Name: Wayne [name] MRN: [id number] Date of Birth: 1960-05-06  Wayne [name] [name] presents today for Blood Pressure recheck with clinical support staff.  Order for BP recheck by Rosaline Bohr NP, ordered on 10/02/2023.   BP Readings from Last 3 Encounters:  10/02/23 (!) 153/92  03/19/23 119/83  02/27/23 (!) 157/75    Current Outpatient Medications  Medication Sig Dispense Refill   allopurinol  (ZYLOPRIM ) 300 MG tablet TAKE 1 Tablet BY MOUTH ONCE DAILY IN THE MORNING 90 tablet 0   amLODipine  (NORVASC ) 10 MG tablet TAKE 1 Tablet BY MOUTH ONCE DAILY 90 tablet 1   celecoxib  (CELEBREX ) 200 MG capsule Take 1 capsule (200 mg total) by mouth 2 (two) times daily. 120 capsule 1   chlorthalidone  (HYGROTON ) 25 MG tablet TAKE 1 Tablet BY MOUTH ONCE DAILY 90 tablet 0   gemfibrozil  (LOPID ) 600 MG tablet Take 1 tablet (600 mg total) by mouth 2 (two) times daily before a meal. 180 tablet 1   ibuprofen  (ADVIL ) 800 MG tablet TAKE ONE TABLET BY MOUTH EVERY 8 HOURS as needed for pain 20 tablet 0   losartan  (COZAAR ) 100 MG tablet TAKE ONE TABLET BY MOUTH every evening 90 tablet 0   rosuvastatin  (CRESTOR ) 40 MG tablet Take 1 tablet (40 mg total) by mouth daily. 90 tablet 3   No current facility-administered medications for this visit.    Hypertensive Medication Review: Patient states that they are taking all their hypertensive medications as prescribed and their last dose of hypertensive medications was this morning   Documentation of any medication adherence discrepancies: none  Provider Recommendation:  Spoke to Zarephath and she stated: bp is much better continue medications and follow in 3 months  Patient has been given provider's recommendations and does not have any questions or concerns at this time. Patient will contact the office for any future questions or concerns.

## 2023-11-08 NOTE — Telephone Encounter (Signed)
 Requested Prescriptions  Pending Prescriptions Disp Refills   allopurinol  (ZYLOPRIM ) 300 MG tablet [Pharmacy Med Name: allopurinol  300 mg tablet] 90 tablet 1    Sig: TAKE 1 Tablet BY MOUTH ONCE DAILY IN THE MORNING     Endocrinology:  Gout Agents - allopurinol  Passed - 11/08/2023  9:18 AM      Passed - Uric Acid in normal range and within 360 days    Uric Acid  Date Value Ref Range Status  10/02/2023 4.0 3.8 - 8.4 mg/dL Final    Comment:               Therapeutic target for gout patients: <6.0         Passed - Cr in normal range and within 360 days    Creatinine, Ser  Date Value Ref Range Status  10/02/2023 0.81 0.76 - 1.27 mg/dL Final         Passed - Valid encounter within last 12 months    Recent Outpatient Visits           1 month ago Pre-operative clearance   [place] [facility] Celestia Rosaline SQUIBB, NP   8 months ago Essential hypertension   [place] [facility] Celestia Rosaline SQUIBB, NP   1 year ago Colon cancer screening   [place] [facility] Celestia Rosaline SQUIBB, NP   1 year ago Essential hypertension   [place] [facility] Celestia Rosaline SQUIBB, NP   1 year ago Mixed hyperlipidemia   [place] [facility] Celestia Rosaline SQUIBB, NP              Passed - CBC within normal limits and completed in the last 12 months    WBC  Date Value Ref Range Status  10/02/2023 6.0 3.4 - 10.8 x10E3/uL Final  03/30/2020 6.2 4.0 - 10.5 K/uL Final   RBC  Date Value Ref Range Status  10/02/2023 4.97 4.14 - 5.80 x10E6/uL Final  03/30/2020 4.86 4.22 - 5.81 MIL/uL Final   Hemoglobin  Date Value Ref Range Status  10/02/2023 14.7 13.0 - 17.7 g/dL Final   Hematocrit  Date Value Ref Range Status  10/02/2023 44.8 37.5 - 51.0 % Final   MCHC  Date Value Ref Range Status  10/02/2023 32.8 31.5 - 35.7 g/dL Final  96/90/7977 66.6 30.0 - 36.0 g/dL Final   [facility]  Date Value Ref Range Status   10/02/2023 29.6 26.6 - 33.0 pg Final  03/30/2020 30.0 26.0 - 34.0 pg Final   MCV  Date Value Ref Range Status  10/02/2023 90 79 - 97 fL Final   No results found for: PLTCOUNTKUC, LABPLAT, POCPLA RDW  Date Value Ref Range Status  10/02/2023 14.8 11.6 - 15.4 % Final

## 2023-11-25 ENCOUNTER — Encounter: Payer: Self-pay | Admitting: Radiology

## 2023-12-02 ENCOUNTER — Other Ambulatory Visit (INDEPENDENT_AMBULATORY_CARE_PROVIDER_SITE_OTHER): Payer: Self-pay | Admitting: Primary Care

## 2023-12-02 DIAGNOSIS — I10 Essential (primary) hypertension: Secondary | ICD-10-CM

## 2023-12-02 DIAGNOSIS — Z760 Encounter for issue of repeat prescription: Secondary | ICD-10-CM

## 2023-12-03 NOTE — Telephone Encounter (Signed)
 Requested Prescriptions  Pending Prescriptions Disp Refills   amLODipine  (NORVASC ) 10 MG tablet [Pharmacy Med Name: amlodipine  10 mg tablet] 90 tablet 1    Sig: TAKE 1 Tablet BY MOUTH ONCE DAILY     Cardiovascular: Calcium  Channel Blockers 2 Passed - 12/03/2023  4:59 PM      Passed - Last BP in normal range    BP Readings from Last 1 Encounters:  11/07/23 120/75         Passed - Last Heart Rate in normal range    Pulse Readings from Last 1 Encounters:  10/02/23 74         Passed - Valid encounter within last 6 months    Recent Outpatient Visits           2 months ago Pre-operative clearance   [place] [facility] Celestia Rosaline SQUIBB, NP   9 months ago Essential hypertension   [place] [facility] Celestia Rosaline SQUIBB, NP   1 year ago Colon cancer screening   [place] [facility] Celestia Rosaline SQUIBB, NP   1 year ago Essential hypertension   [place] [facility] Celestia Rosaline SQUIBB, NP   1 year ago Mixed hyperlipidemia   [place] [facility] Celestia Rosaline SQUIBB, NP

## 2023-12-15 ENCOUNTER — Encounter (INDEPENDENT_AMBULATORY_CARE_PROVIDER_SITE_OTHER): Payer: Self-pay | Admitting: Primary Care

## 2023-12-16 NOTE — Telephone Encounter (Signed)
 Herlene would you be able to take care of this if possible

## 2023-12-17 ENCOUNTER — Other Ambulatory Visit: Payer: Self-pay | Admitting: Pharmacist

## 2023-12-17 MED ORDER — NICOTINE POLACRILEX 2 MG MT GUM: 2.0000 mg | CHEWING_GUM | OROMUCOSAL | 0 refills | Status: DC | PRN

## 2024-01-13 ENCOUNTER — Other Ambulatory Visit (INDEPENDENT_AMBULATORY_CARE_PROVIDER_SITE_OTHER): Payer: Self-pay | Admitting: Primary Care

## 2024-01-13 DIAGNOSIS — I10 Essential (primary) hypertension: Secondary | ICD-10-CM

## 2024-01-13 DIAGNOSIS — Z760 Encounter for issue of repeat prescription: Secondary | ICD-10-CM

## 2024-02-07 ENCOUNTER — Telehealth (INDEPENDENT_AMBULATORY_CARE_PROVIDER_SITE_OTHER): Payer: Self-pay | Admitting: Primary Care

## 2024-02-07 NOTE — Telephone Encounter (Signed)
 Spoke to pt about upcoming appt.. Will be present

## 2024-02-10 ENCOUNTER — Encounter (INDEPENDENT_AMBULATORY_CARE_PROVIDER_SITE_OTHER): Payer: Self-pay | Admitting: Primary Care

## 2024-02-10 ENCOUNTER — Ambulatory Visit (INDEPENDENT_AMBULATORY_CARE_PROVIDER_SITE_OTHER): Admitting: Primary Care

## 2024-02-10 VITALS — BP 150/84 | HR 92 | Resp 16 | Ht 75.0 in | Wt 245.6 lb

## 2024-02-10 DIAGNOSIS — M1711 Unilateral primary osteoarthritis, right knee: Secondary | ICD-10-CM | POA: Diagnosis not present

## 2024-02-10 DIAGNOSIS — E119 Type 2 diabetes mellitus without complications: Secondary | ICD-10-CM

## 2024-02-10 DIAGNOSIS — Z8739 Personal history of other diseases of the musculoskeletal system and connective tissue: Secondary | ICD-10-CM

## 2024-02-10 DIAGNOSIS — M1A9XX1 Chronic gout, unspecified, with tophus (tophi): Secondary | ICD-10-CM

## 2024-02-10 DIAGNOSIS — E782 Mixed hyperlipidemia: Secondary | ICD-10-CM

## 2024-02-10 DIAGNOSIS — M1712 Unilateral primary osteoarthritis, left knee: Secondary | ICD-10-CM

## 2024-02-10 DIAGNOSIS — Z760 Encounter for issue of repeat prescription: Secondary | ICD-10-CM

## 2024-02-10 DIAGNOSIS — I10 Essential (primary) hypertension: Secondary | ICD-10-CM | POA: Diagnosis not present

## 2024-02-10 MED ORDER — LOSARTAN POTASSIUM 100 MG PO TABS: 100.0000 mg | ORAL_TABLET | Freq: Every evening | ORAL | 1 refills | Status: AC

## 2024-02-10 MED ORDER — ALLOPURINOL 300 MG PO TABS: 300.0000 mg | ORAL_TABLET | Freq: Every morning | ORAL | 1 refills | Status: AC

## 2024-02-10 MED ORDER — CELECOXIB 200 MG PO CAPS: 200.0000 mg | ORAL_CAPSULE | Freq: Two times a day (BID) | ORAL | 1 refills | Status: AC

## 2024-02-10 MED ORDER — CHLORTHALIDONE 25 MG PO TABS: 25.0000 mg | ORAL_TABLET | Freq: Every day | ORAL | 1 refills | Status: AC

## 2024-02-10 MED ORDER — IBUPROFEN 800 MG PO TABS: 800.0000 mg | ORAL_TABLET | Freq: Three times a day (TID) | ORAL | 0 refills | Status: AC | PRN

## 2024-02-10 MED ORDER — AMLODIPINE BESYLATE 10 MG PO TABS: 10.0000 mg | ORAL_TABLET | Freq: Every day | ORAL | 1 refills | Status: AC

## 2024-02-10 NOTE — Progress Notes (Signed)
 " [facility]  Wayne [name], is a 64 y.o. male  RDW:[id number]  FMW:[id number]  DOB - 11-Oct-1960  Chief Complaint  Patient presents with   Hypertension       Subjective:   Wayne [name] is a 64 y.o. male here today for a follow up visit. Patient has No headache, No chest pain, No abdominal pain - No Nausea, No new weakness tingling or numbness, No Cough - shortness of breath HPI  No problems updated.  Comprehensive ROS Pertinent positive and negative noted in HPI   Allergies[1]  Past Medical History:  Diagnosis Date   Arthritis    shoulders    Gout    High cholesterol    Hypertension    Prostate cancer (HCC)     Medications Ordered Prior to Encounter[2] Health Maintenance  Topic Date Due   Complete foot exam   Never done   Eye exam for diabetics  Never done   Kidney health urinalysis for diabetes  Never done   Zoster (Shingles) Vaccine (1 of 2) Never done   COVID-19 Vaccine (3 - Pfizer risk series) 10/17/2019   Hemoglobin A1C  08/15/2022   Flu Shot  04/21/2024*   Pneumococcal Vaccine for age over 31 (1 of 2 - PCV) 10/01/2024*   Yearly kidney function blood test for diabetes  10/01/2024   Cologuard (Stool DNA test)  10/26/2025   DTaP/Tdap/Td vaccine (2 - Td or Tdap) 05/15/2026   HPV Vaccine (No Doses Required) Completed   Hepatitis C Screening  Completed   HIV Screening  Completed   Hepatitis B Vaccine  Aged Out   Meningitis B Vaccine  Aged Out  *Topic was postponed. The date shown is not the original due date.    Objective:   Vitals:   02/10/24 1401 02/10/24 1443  BP: (!) 157/90 (!) 150/84  Pulse: 92   Resp: 16   SpO2: 99%   Weight: 245 lb 9.6 oz (111.4 kg)   Height: 6' 3 (1.905 m)    BP Readings from Last 3 Encounters:  02/10/24 (!) 150/84  11/07/23 120/75  10/02/23 (!) 153/92      Physical Exam Vitals reviewed.  Constitutional:      Appearance: He is obese.  HENT:     Head: Normocephalic.     Right Ear: Tympanic  membrane and external ear normal.     Left Ear: Tympanic membrane and external ear normal.     Nose: Nose normal.  Eyes:     Extraocular Movements: Extraocular movements intact.     Pupils: Pupils are equal, round, and reactive to light.  Cardiovascular:     Rate and Rhythm: Normal rate and regular rhythm.  Pulmonary:     Effort: Pulmonary effort is normal.     Breath sounds: Normal breath sounds.  Abdominal:     General: Bowel sounds are normal. There is distension.     Palpations: Abdomen is soft.  Musculoskeletal:        General: Normal range of motion.  Skin:    General: Skin is warm and dry.  Neurological:     Mental Status: He is oriented to person, place, and time.  Psychiatric:        Mood and Affect: Mood normal.        Behavior: Behavior normal.        Thought Content: Thought content normal.        Judgment: Judgment normal.       Assessment & Plan  Wayne [name] was seen today for hypertension.  Diagnoses and all orders for this visit:  Essential hypertension -     CMP14+EGFR; Future -     losartan  (COZAAR ) 100 MG tablet; Take 1 tablet (100 mg total) by mouth every evening. -     chlorthalidone  (HYGROTON ) 25 MG tablet; Take 1 tablet (25 mg total) by mouth daily. -     amLODipine  (NORVASC ) 10 MG tablet; Take 1 tablet (10 mg total) by mouth daily.  Mixed hyperlipidemia -     Lipid panel; Future  Type 2 diabetes mellitus without complication, without long-term current use of insulin (HCC) -     CBC with Differential/Platelet; Future -     Hemoglobin A1c; Future -     Microalbumin / creatinine urine ratio; Future  Unilateral primary osteoarthritis, left knee -     ibuprofen  (ADVIL ) 800 MG tablet; Take 1 tablet (800 mg total) by mouth every 8 (eight) hours as needed. for pain  Unilateral primary osteoarthritis, right knee -     ibuprofen  (ADVIL ) 800 MG tablet; Take 1 tablet (800 mg total) by mouth every 8 (eight) hours as needed. for pain  Medication refill -      losartan  (COZAAR ) 100 MG tablet; Take 1 tablet (100 mg total) by mouth every evening. -     chlorthalidone  (HYGROTON ) 25 MG tablet; Take 1 tablet (25 mg total) by mouth daily. -     amLODipine  (NORVASC ) 10 MG tablet; Take 1 tablet (10 mg total) by mouth daily. -     allopurinol  (ZYLOPRIM ) 300 MG tablet; Take 1 tablet (300 mg total) by mouth every morning.  Primary osteoarthritis of right knee -     celecoxib  (CELEBREX ) 200 MG capsule; Take 1 capsule (200 mg total) by mouth 2 (two) times daily.  Chronic gout with tophus, unspecified cause, unspecified site -     celecoxib  (CELEBREX ) 200 MG capsule; Take 1 capsule (200 mg total) by mouth 2 (two) times daily.  History of gout -     allopurinol  (ZYLOPRIM ) 300 MG tablet; Take 1 tablet (300 mg total) by mouth every morning.    Patient have been counseled extensively about nutrition and exercise. Other issues discussed during this visit include: low cholesterol diet, weight control and daily exercise, foot care, annual eye examinations at Ophthalmology, importance of adherence with medications and regular follow-up. We also discussed long term complications of uncontrolled diabetes and hypertension.   Return in about 6 months (around 08/09/2024).  The patient was given clear instructions to go to ER or return to medical center if symptoms don't improve, worsen or new problems develop. The patient verbalized understanding. The patient was told to call to get lab results if they haven't heard anything in the next week.   This note has been created with Education officer, environmental. Any transcriptional errors are unintentional.   Rosaline SHAUNNA Bohr, NP 02/10/2024, 3:59 PM     [1]  Allergies Allergen Reactions   Hydralazine  Other (See Comments)    Headache  [2]  Current Outpatient Medications on File Prior to Visit  Medication Sig Dispense Refill   rosuvastatin  (CRESTOR ) 40 MG tablet Take 1 tablet (40 mg  total) by mouth daily. 90 tablet 3   No current facility-administered medications on file prior to visit.   "

## 2024-02-13 ENCOUNTER — Other Ambulatory Visit (INDEPENDENT_AMBULATORY_CARE_PROVIDER_SITE_OTHER)

## 2024-02-13 DIAGNOSIS — E782 Mixed hyperlipidemia: Secondary | ICD-10-CM

## 2024-02-13 DIAGNOSIS — E119 Type 2 diabetes mellitus without complications: Secondary | ICD-10-CM

## 2024-02-13 DIAGNOSIS — I10 Essential (primary) hypertension: Secondary | ICD-10-CM

## 2024-02-14 LAB — LIPID PANEL
Chol/HDL Ratio: 3 ratio (ref 0.0–5.0)
Cholesterol, Total: 126 mg/dL (ref 100–199)
HDL: 42 mg/dL
LDL Chol Calc (NIH): 55 mg/dL (ref 0–99)
Triglycerides: 171 mg/dL — ABNORMAL HIGH (ref 0–149)
VLDL Cholesterol Cal: 29 mg/dL (ref 5–40)

## 2024-02-14 LAB — CBC WITH DIFFERENTIAL/PLATELET
Basophils Absolute: 0 x10E3/uL (ref 0.0–0.2)
Basos: 0 %
EOS (ABSOLUTE): 0.1 x10E3/uL (ref 0.0–0.4)
Eos: 2 %
Hematocrit: 45.6 % (ref 37.5–51.0)
Hemoglobin: 14.6 g/dL (ref 13.0–17.7)
Immature Grans (Abs): 0 x10E3/uL (ref 0.0–0.1)
Immature Granulocytes: 0 %
Lymphocytes Absolute: 2.1 x10E3/uL (ref 0.7–3.1)
Lymphs: 36 %
MCH: 29.4 pg (ref 26.6–33.0)
MCHC: 32 g/dL (ref 31.5–35.7)
MCV: 92 fL (ref 79–97)
Monocytes Absolute: 0.4 x10E3/uL (ref 0.1–0.9)
Monocytes: 7 %
Neutrophils Absolute: 3.2 x10E3/uL (ref 1.4–7.0)
Neutrophils: 55 %
Platelets: 318 x10E3/uL (ref 150–450)
RBC: 4.97 x10E6/uL (ref 4.14–5.80)
RDW: 14.3 % (ref 11.6–15.4)
WBC: 5.9 x10E3/uL (ref 3.4–10.8)

## 2024-02-14 LAB — CMP14+EGFR
ALT: 22 IU/L (ref 0–44)
AST: 21 IU/L (ref 0–40)
Albumin: 4.4 g/dL (ref 3.9–4.9)
Alkaline Phosphatase: 108 IU/L (ref 47–123)
BUN/Creatinine Ratio: 18 (ref 10–24)
BUN: 14 mg/dL (ref 8–27)
Bilirubin Total: 0.2 mg/dL (ref 0.0–1.2)
CO2: 25 mmol/L (ref 20–29)
Calcium: 9.4 mg/dL (ref 8.6–10.2)
Chloride: 100 mmol/L (ref 96–106)
Creatinine, Ser: 0.79 mg/dL (ref 0.76–1.27)
Globulin, Total: 3.2 g/dL (ref 1.5–4.5)
Glucose: 109 mg/dL — ABNORMAL HIGH (ref 70–99)
Potassium: 3.4 mmol/L — ABNORMAL LOW (ref 3.5–5.2)
Sodium: 145 mmol/L — ABNORMAL HIGH (ref 134–144)
Total Protein: 7.6 g/dL (ref 6.0–8.5)
eGFR: 100 mL/min/1.73

## 2024-02-14 LAB — HEMOGLOBIN A1C
Est. average glucose Bld gHb Est-mCnc: 143 mg/dL
Hgb A1c MFr Bld: 6.6 % — ABNORMAL HIGH (ref 4.8–5.6)

## 2024-02-14 LAB — MICROALBUMIN / CREATININE URINE RATIO
Creatinine, Urine: 219.9 mg/dL
Microalb/Creat Ratio: 7 mg/g{creat} (ref 0–29)
Microalbumin, Urine: 14.3 ug/mL

## 2024-05-13 ENCOUNTER — Ambulatory Visit (INDEPENDENT_AMBULATORY_CARE_PROVIDER_SITE_OTHER): Payer: Self-pay | Admitting: Primary Care
